# Patient Record
Sex: Male | Born: 1950 | Race: White | Hispanic: No | Marital: Married | Smoking: Current every day smoker
Health system: Southern US, Community
[De-identification: ages and names within clinical notes are randomized; demographics above are authoritative.]

## PROBLEM LIST (undated history)

## (undated) DIAGNOSIS — E785 Hyperlipidemia, unspecified: Secondary | ICD-10-CM

## (undated) DIAGNOSIS — A048 Other specified bacterial intestinal infections: Secondary | ICD-10-CM

## (undated) DIAGNOSIS — R079 Chest pain, unspecified: Secondary | ICD-10-CM

## (undated) DIAGNOSIS — I209 Angina pectoris, unspecified: Secondary | ICD-10-CM

## (undated) DIAGNOSIS — G4730 Sleep apnea, unspecified: Secondary | ICD-10-CM

## (undated) DIAGNOSIS — I444 Left anterior fascicular block: Secondary | ICD-10-CM

## (undated) DIAGNOSIS — J449 Chronic obstructive pulmonary disease, unspecified: Secondary | ICD-10-CM

## (undated) DIAGNOSIS — K219 Gastro-esophageal reflux disease without esophagitis: Secondary | ICD-10-CM

## (undated) DIAGNOSIS — M1990 Unspecified osteoarthritis, unspecified site: Secondary | ICD-10-CM

## (undated) DIAGNOSIS — F419 Anxiety disorder, unspecified: Secondary | ICD-10-CM

## (undated) DIAGNOSIS — E663 Overweight: Secondary | ICD-10-CM

## (undated) DIAGNOSIS — J45909 Unspecified asthma, uncomplicated: Secondary | ICD-10-CM

## (undated) DIAGNOSIS — Z720 Tobacco use: Secondary | ICD-10-CM

## (undated) DIAGNOSIS — I10 Essential (primary) hypertension: Secondary | ICD-10-CM

## (undated) HISTORY — PX: ORIF ANKLE FRACTURE: SUR919

## (undated) HISTORY — DX: Hyperlipidemia, unspecified: E78.5

## (undated) HISTORY — DX: Chest pain, unspecified: R07.9

## (undated) HISTORY — DX: Overweight: E66.3

## (undated) HISTORY — DX: Left anterior fascicular block: I44.4

## (undated) HISTORY — DX: Gastro-esophageal reflux disease without esophagitis: K21.9

## (undated) HISTORY — DX: Tobacco use: Z72.0

## (undated) HISTORY — PX: TONSILLECTOMY: SUR1361

## (undated) HISTORY — DX: Other specified bacterial intestinal infections: A04.8

## (undated) HISTORY — PX: ORIF TIBIA FRACTURE: SHX5416

## (undated) HISTORY — PX: CERVICAL SPINE SURGERY: SHX589

---

## 2001-05-08 ENCOUNTER — Encounter: Payer: Self-pay | Admitting: Internal Medicine

## 2001-05-08 ENCOUNTER — Emergency Department (HOSPITAL_COMMUNITY): Admission: EM | Admit: 2001-05-08 | Discharge: 2001-05-08 | Payer: Self-pay | Admitting: Internal Medicine

## 2001-06-05 ENCOUNTER — Emergency Department (HOSPITAL_COMMUNITY): Admission: EM | Admit: 2001-06-05 | Discharge: 2001-06-05 | Payer: Self-pay | Admitting: *Deleted

## 2002-02-16 ENCOUNTER — Encounter: Payer: Self-pay | Admitting: Emergency Medicine

## 2002-02-16 ENCOUNTER — Emergency Department (HOSPITAL_COMMUNITY): Admission: EM | Admit: 2002-02-16 | Discharge: 2002-02-16 | Payer: Self-pay | Admitting: Emergency Medicine

## 2002-09-05 ENCOUNTER — Encounter: Payer: Self-pay | Admitting: *Deleted

## 2002-09-05 ENCOUNTER — Emergency Department (HOSPITAL_COMMUNITY): Admission: EM | Admit: 2002-09-05 | Discharge: 2002-09-05 | Payer: Self-pay | Admitting: *Deleted

## 2002-11-17 ENCOUNTER — Emergency Department (HOSPITAL_COMMUNITY): Admission: EM | Admit: 2002-11-17 | Discharge: 2002-11-18 | Payer: Self-pay | Admitting: *Deleted

## 2002-11-18 ENCOUNTER — Encounter: Payer: Self-pay | Admitting: *Deleted

## 2003-10-31 DIAGNOSIS — R079 Chest pain, unspecified: Secondary | ICD-10-CM

## 2003-10-31 HISTORY — DX: Chest pain, unspecified: R07.9

## 2004-08-09 ENCOUNTER — Inpatient Hospital Stay (HOSPITAL_COMMUNITY): Admission: EM | Admit: 2004-08-09 | Discharge: 2004-08-12 | Payer: Self-pay | Admitting: *Deleted

## 2004-09-27 HISTORY — PX: ESOPHAGOGASTRODUODENOSCOPY: SHX1529

## 2004-10-14 ENCOUNTER — Ambulatory Visit: Payer: Self-pay | Admitting: Internal Medicine

## 2004-10-19 ENCOUNTER — Ambulatory Visit: Payer: Self-pay | Admitting: Internal Medicine

## 2004-10-19 ENCOUNTER — Ambulatory Visit (HOSPITAL_COMMUNITY): Admission: RE | Admit: 2004-10-19 | Discharge: 2004-10-19 | Payer: Self-pay | Admitting: Internal Medicine

## 2004-10-19 HISTORY — PX: COLONOSCOPY: SHX174

## 2005-02-18 ENCOUNTER — Emergency Department (HOSPITAL_COMMUNITY): Admission: EM | Admit: 2005-02-18 | Discharge: 2005-02-18 | Payer: Self-pay | Admitting: Emergency Medicine

## 2005-04-27 ENCOUNTER — Ambulatory Visit: Payer: Self-pay | Admitting: Urgent Care

## 2005-05-03 ENCOUNTER — Encounter (HOSPITAL_COMMUNITY): Admission: RE | Admit: 2005-05-03 | Discharge: 2005-06-02 | Payer: Self-pay | Admitting: Internal Medicine

## 2005-06-06 ENCOUNTER — Emergency Department (HOSPITAL_COMMUNITY): Admission: EM | Admit: 2005-06-06 | Discharge: 2005-06-07 | Payer: Self-pay | Admitting: *Deleted

## 2006-02-15 ENCOUNTER — Emergency Department (HOSPITAL_COMMUNITY): Admission: EM | Admit: 2006-02-15 | Discharge: 2006-02-15 | Payer: Self-pay | Admitting: Emergency Medicine

## 2007-10-03 ENCOUNTER — Emergency Department (HOSPITAL_COMMUNITY): Admission: EM | Admit: 2007-10-03 | Discharge: 2007-10-04 | Payer: Self-pay | Admitting: Emergency Medicine

## 2009-05-28 ENCOUNTER — Encounter: Payer: Self-pay | Admitting: Emergency Medicine

## 2009-05-28 ENCOUNTER — Inpatient Hospital Stay (HOSPITAL_COMMUNITY): Admission: EM | Admit: 2009-05-28 | Discharge: 2009-06-06 | Payer: Self-pay | Admitting: Emergency Medicine

## 2009-10-17 ENCOUNTER — Emergency Department (HOSPITAL_COMMUNITY): Admission: EM | Admit: 2009-10-17 | Discharge: 2009-10-17 | Payer: Self-pay | Admitting: Emergency Medicine

## 2010-10-07 ENCOUNTER — Ambulatory Visit (HOSPITAL_COMMUNITY)
Admission: RE | Admit: 2010-10-07 | Discharge: 2010-10-07 | Payer: Self-pay | Source: Home / Self Care | Attending: Family Medicine | Admitting: Family Medicine

## 2011-02-04 LAB — BASIC METABOLIC PANEL
BUN: 16 mg/dL (ref 6–23)
CO2: 29 mEq/L (ref 19–32)
Calcium: 8.4 mg/dL (ref 8.4–10.5)
Chloride: 105 mEq/L (ref 96–112)
Creatinine, Ser: 0.97 mg/dL (ref 0.4–1.5)
GFR calc Af Amer: >60 mL/min (ref >60)
GFR calc non Af Amer: >60 mL/min (ref >60)
Glucose, Bld: 126 mg/dL — ABNORMAL HIGH (ref 70–99)
Potassium: 3.9 mEq/L (ref 3.5–5.1)
Sodium: 139 mEq/L (ref 135–145)

## 2011-02-04 LAB — CBC
HCT: 43.1 % (ref 39.0–52.0)
Hemoglobin: 14.9 g/dL (ref 13.0–17.0)
MCHC: 34.5 g/dL (ref 30.0–36.0)
MCV: 97.3 fL (ref 78.0–100.0)
Platelets: 185 10*3/uL (ref 150–400)
RBC: 4.43 MIL/uL (ref 4.22–5.81)
RDW: 14.2 % (ref 11.5–15.5)
WBC: 13.5 10*3/uL — ABNORMAL HIGH (ref 4.0–10.5)
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
## 2011-03-17 NOTE — H&P (Signed)
NAME:  Trevor Berg, Trevor Berg                 ACCOUNT NO.:  [id number]   MEDICAL RECORD NO.:  [id number]          PATIENT TYPE:  AMB   LOCATION:                                FACILITY:  [facility]   PHYSICIAN:  Dalia Heading, M.D.  DATE OF BIRTH:  12/26/1950   DATE OF ADMISSION:  DATE OF DISCHARGE:  LH                                HISTORY & PHYSICAL   CHIEF COMPLAINT:  Chronic cholecystitis.   HISTORY OF PRESENT ILLNESS:  The patient is a 60 year old white male  referred for evaluation and treatment of biliary colic secondary to chronic  cholecystitis.  He has been having right upper quadrant abdominal pain with  radiation to the right flank, nausea, and bloating for several months.  He  does have fatty food intolerance.  No fever, chills, jaundice have been  noted.   PAST MEDICAL HISTORY:  1.  Coronary artery disease.  2.  Hypertension.  3.  Reflux disease.   PAST SURGICAL HISTORY:  Ankle surgery.   CURRENT MEDICATIONS:  1.  Prevacid.  2.  Nitroglycerin p.r.n.  3.  Atenolol 50 mg p.o. daily.  4.  Lactulose 15 mL p.o. b.i.d.  5.  Nystatin 5 mL p.o. four times a day.  6.  Baby aspirin daily.   ALLERGIES:  No known drug allergies.   REVIEW OF SYSTEMS:  The patient smokes two packs of cigarettes a day.  He  drinks alcohol rarely.   PHYSICAL EXAMINATION:  GENERAL: The patient is a well-developed, well-  nourished white male in no acute distress.  VITAL SIGNS: Afebrile, vital signs stable.  LUNGS:  Clear to auscultation with equal breath sounds bilaterally.  HEART:  Regular rate and rhythm without S3, S4, or murmurs.  ABDOMEN: Soft, nondistended.  He is tender in the right upper quadrant to  palpation.  No hepatosplenomegaly, masses, hernias identified.   Ultrasound of the gallbladder is unremarkable.   Hepatobiliary scan reveals low gallbladder ejection fraction with  reproducible symptoms with a fatty meal.   IMPRESSION:  Chronic cholecystitis.   PLAN:  The patient is scheduled  for laparoscopic cholecystectomy on May 22, 2005.  The risks and benefits of the procedure including bleeding,  infection, hepatobiliary injury, and the possibility of an open procedure  were fully explained to the patient who gave informed consent.       MAJ/MEDQ  D:  05/16/2005  T:  05/16/2005  Job:  [id number]   cc:   Lionel December, M.D.  [address]  [place]  [place] [address]

## 2011-03-17 NOTE — Cardiovascular Report (Signed)
NAMEWHITNEY, Trevor Berg                 ACCOUNT NO.:  [id number]   MEDICAL RECORD NO.:  [id number]          PATIENT TYPE:  INP   LOCATION:  3732                         FACILITY:  [facility]   PHYSICIAN:  Darlin Priestly, MD  DATE OF BIRTH:  1951-02-05   DATE OF PROCEDURE:  08/11/2004  DATE OF DISCHARGE:                              CARDIAC CATHETERIZATION   PROCEDURES:  1.  Left heart catheterization.  2.  Coronary angiography.  3.  Left ventriculogram.   ATTENDING PHYSICIAN:  Darlin Priestly, MD   COMPLICATIONS:  None.   INDICATIONS FOR PROCEDURE:  This is a 60 year old male who is a patient of  Dr. Ilene Qua and Dr. Regino Schultze with a history of hypertension, tobacco  abuse and a history of gastritis who was admitted to the hospital on August 09, 2004 with abdominal pain and chest pain. He continued to have symptoms  throughout his stay at [facility] and was ultimately transferred to  [facility].  He is now referred for cardiac catheterization to rule  out significant coronary artery disease.   DESCRIPTION OF PROCEDURE:  After obtaining informed consent the patient was  brought to the cardiac catheterization laboratory where the right and left  groin were shaved, prepped and draped in the usual sterile fashion.  EKG  monitoring was established.  Using the modified Seldinger technique, a #6  French arterial sheath was inserted in the right femoral artery.  A 6 French  diagnostic catheter was used to perform diagnostic angiography.   The left main is a large vessel with no significant disease.   The LAD is a medium size vessel which courses three quarters of the way down  the anterior wall and has no significant disease.  There are two small  diagonal branches with no significant disease.   The left circumflex is a large vessel coursing through the AV groove.  The  third obtuse marginal branch is free of the AV groove circumflex and noted  to have a mild 30%  narrowing after the takeoff the second OM.   The first OM is a small vessel which bifurcates in the proximal portion with  no significant disease.   The second OM is a large vessel which bifurcates distally and has 50% mid  vessel stenosis.   The third OM is a small vessel with no significant disease.   The right coronary artery is a large vessel which is dominant and gives rise  to both the PDA as well as the posterolateral branch.  The RCA is ectatic  but has no high grade stenosis.   The PDA is a medium size vessel with no significant disease.   The PLA is a large vessel which bifurcates in the mid segment. There is 40%  lesion in the proximal portion fo the lower bifurcation.   Left ventriculogram reveals a preserved ejection fraction of 60%.   Hemodynamics:  The systemic arterial pressure was 112/69.  Left ventricular  pressure was 115/8.  Left ventricular end diastolic pressure was 13.   CONCLUSION:  1.  Non-critical  coronary artery disease.  2.  Normal left ventricular systolic function.      Robe   RHM/MEDQ  D:  08/11/2004  T:  08/11/2004  Job:  [id number]

## 2011-03-17 NOTE — Consult Note (Signed)
NAME:  Trevor Berg, Trevor Berg                 ACCOUNT NO.:  [id number]   MEDICAL RECORD NO.:  [id number]          PATIENT TYPE:  INP   LOCATION:  A227                          FACILITY:  [facility]   PHYSICIAN:  R. Roetta Sessions, M.D. DATE OF BIRTH:  [birth year]   DATE OF CONSULTATION:  08/09/2004  DATE OF DISCHARGE:                                   CONSULTATION   REASON FOR CONSULTATION:  Abdominal pain.   REFERRING PHYSICIAN:  Kirk Ruths, M.D.   HISTORY OF PRESENT ILLNESS:  The patient is a 60 year old Caucasian  gentleman who was admitted with chest pain and abdominal pain.  He has a  history of hypertension and modest ____________.  States he has had  abdominal pain for at least six months with intermittent nausea and  vomiting.  He has had episodes of coffee ground emesis and melena most  recently approximately a month ago.  Chest pain began about a week ago.  He  states he simply could not take it anymore.  He also has had some dyspnea on  exertion and diaphoresis with his chest pain.  He notes that the abdominal  pains tend to occur after eating.  He feels full all the time.  He has  severe heartburn symptoms and takes Rolaids regularly with little relief.  He complains of dysphagia of solid foods as well as some odynophagia.  Pain  is located in the upper abdomen.  Nothing seems to be make it any better.  He denies any constipation, diarrhea.  He does have some intermittent small  volume bright red blood per rectum.  He was Hemoccult negative in the  emergency department.  CBC, LFT's, amylase, and lipase are all normal.  Met-  7 is normal as well.  Acute abdominal film revealed no acute disease.  ETOH  level was 19.  Total CK and CK-MB were negative x3.   MEDICATIONS PRIOR TO ADMISSION:  1.  Atenolol 50 mg daily.  2.  Folate p.r.n.   ALLERGIES:  No known drug allergies.   PAST MEDICAL HISTORY:  1.  Hypertension.  2.  Gastroesophageal reflux disease.   PAST SURGICAL  HISTORY:  1.  Tonsillectomy.  2.  Status post right ankle fracture surgery.   FAMILY HISTORY:  Mother died of MI in her 80s.  He has a brother with ulcers  and diabetes mellitus, another brother with borderline diabetes mellitus.   SOCIAL HISTORY:  He is married, has three children.  He is employed as a  Electrical engineer at [employer].  He states he was a prior  heavy alcohol abuser, but had cut back once he got this last job seven years  ago.  He states he is currently drinking approximately one beer every other  day and no liquor.  He denies any drugs.  He smokes 2-1/2 packs of  cigarettes daily and has done so for over 30 years.   REVIEW OF SYSTEMS:  Please see HPI for GI.  CARDIOPULMONARY:  In addition he  complains of chronic cough.  GENERAL:  Denies any weight  loss.   PHYSICAL EXAMINATION:  VITAL SIGNS:  Temperature max 99.2, temperature  currently 97, pulse 69, respirations 20, blood pressure 139/98, weight 192,  height 67 inches.  GENERAL:  A pleasant well-developed, well-nourished Caucasian male in no  acute distress.  SKIN:  Warm and dry, no jaundice.  HEENT:  Pupils equal, round, reactive to light.  Conjunctivae are pink.  Sclerae are nonicteric.  Oropharyngeal mucosa moist and pink, no lesions, no  erythema, no exudates.  NECK:  No lymphadenopathy, no thyromegaly.  CHEST:  Lungs clear to auscultation.  CARDIAC:  Regular rate and rhythm.  Normal S1 and S2.  No murmurs, rubs, or  gallops.  ABDOMEN:  Positive bowel sounds, protuberant, symmetrical.  Soft.  Moderate  tenderness throughout the entire upper abdomen to deep palpation.  Occasional guarding.  No rebound tenderness.  It is difficult to assess for  hepatosplenomegaly or masses due to guarding, but feels like there is some  hepatomegaly based on the first percussion.  EXTREMITIES:  No edema.   LABORATORY DATA:  White blood cells 8.5, hemoglobin 15.1, hematocrit 43.4,  platelets 325,000.   Sodium 141, potassium 4.4, BUN 7, creatinine 1, glucose  99.  Total bilirubin 0.5, alkaline phosphatase 75, SGOT 25, SGPT 29, albumin  3.8, amylase 85, lipase 46.   IMPRESSION:  The patient is a 60 year old gentleman who presents with a one  week history of chest pain, six month history of upper abdominal pain with  intermittent nausea and vomiting, remote coffee ground emesis, chronic  gastroesophageal reflux disease, and dysphagia.  This is in the setting of  reported modest alcohol abuse with chronic tobacco abuse.  He also complains  of small volumes of intermittent hematochezia and/or early satiety.  I would  be concerned about the possibility of peptic ulcer disease, biliary etiology  not excluded.  Rectal bleeding may be due to a benign anorectal source such  as hemorrhoids, however, ultimately he will have colonoscopy for further  evaluation.  It is unclear at this time whether his chest pain is cardiac in  etiology, although he does describe some dyspnea on exertion and diaphoresis  related to this chest pain.  Initially, would like to await initial  cardiology evaluation prior to endoscopy.  Would also follow up with  abdominal ultrasound which is pending.  Ultimately, he will need to have an  upper endoscopy as an inpatient and consider a colonoscopy, possibly done as  an outpatient.  I would like to thank Dr. Regino Schultze for allowing [place] to take  part in the care of this patient.      ________________________________________  Tana Coast, P.A.  ___________________________________________  R. Roetta Sessions, M.D.    LL/MEDQ  D:  08/09/2004  T:  08/09/2004  Job:  [id number]   cc:   Kirk Ruths, M.D.  [address]  [place]  [place] [address]  Fax: [phone]

## 2011-03-17 NOTE — Op Note (Signed)
NAME:  Trevor Berg, Trevor Berg                 ACCOUNT NO.:  [id number]   MEDICAL RECORD NO.:  [id number]          PATIENT TYPE:  AMB   LOCATION:  DAY                           FACILITY:  [facility]   PHYSICIAN:  R. Roetta Sessions, M.D. DATE OF BIRTH:  05-01-1951   DATE OF PROCEDURE:  10/19/2004  DATE OF DISCHARGE:                                 OPERATIVE REPORT   PROCEDURE:  Diagnostic colonoscopy.   INDICATIONS FOR PROCEDURE:  The patient is a 60 year old gentleman with  constipation and intermittent hematochezia. He was just recently started on  MiraLax 17 g orally daily. He is also having some recurrent reflux symptoms  in spite of Prevacid. He was switched to Aciphex 20 mg orally twice daily  which has been very effective in combating his reflux symptoms. Prior EGD  demonstrated Schatzki's ring and erosive esophagitis. He underwent dilation.  Colonoscopy is now being done for the above mentioned reasons. This approach  has been discussed with the patient previously. Potential risks, benefits,  and alternatives have been reviewed and questions answered. He is agreeable.  Please see documentation in the medical record.   PROCEDURE NOTE:  O2 saturation, blood pressure, pulse, and respirations  monitored throughout the entirety of the procedure. Conscious sedation with  Versed 4 mg IV and Demerol 50 mg IV in divided doses. SB prophylaxis  ampicillin 2 g IV, gentamicin 120 mg IV prior to the procedure.   INSTRUMENT:  Olympus video chip system.   FINDINGS:  Digital rectal examination revealed no abnormalities.   ENDOSCOPIC FINDINGS:  Prep was good.   Rectum:  Examination of the rectal mucosa including retroflexed view of anal  verge revealed a somewhat friable anal canal. Otherwise rectal mucosa  appeared normal.   Colon:  Colonic mucosa was surveyed from the rectosigmoid junction through  the left, transverse, and right colon to area of the appendiceal orifice,  ileocecal valve, and cecum.  These structures were well seen and photographed  for the record. Olympus video scope was slowly withdrawn, and all previously  mentioned mucosal surfaces were again seen. The colonic mucosa appeared  normal. The patient tolerated the procedure well and was reactive to  endoscopy.   IMPRESSION:  Friable anal canal, likely source of patient's hematochezia.  Otherwise normal rectum, normal colon.   RECOMMENDATIONS:  1.  Continue MiraLax 17 orally daily.  2.  The patient is to bolster his daily fiber intake.  3.  A 10-day course of Anusol A.C. suppositories 1 per rectum at bedtime.  4.  Office follow up with [place] in 6 months.     Otelia Sergeant   RMR/MEDQ  D:  10/19/2004  T:  10/19/2004  Job:  [id number]   cc:   Kirk Ruths, M.D.  [address]  [place]  [place] [address]  Fax: [phone]

## 2011-03-17 NOTE — H&P (Signed)
NAME:  Trevor Berg, Trevor Berg                 ACCOUNT NO.:  [id number]   MEDICAL RECORD NO.:  [id number]          PATIENT TYPE:  AMB   LOCATION:  DAY                           FACILITY:  [facility]   PHYSICIAN:  Lionel December, M.D.    DATE OF BIRTH:  [birth year]   DATE OF ADMISSION:  DATE OF DISCHARGE:  LH                                HISTORY & PHYSICAL   CHIEF COMPLAINT:  Abdominal pain, constipation, and blood in stool.   HISTORY OF PRESENT ILLNESS:  The patient is a 60 year old Caucasian  gentleman who presents today for further evaluation of the above-stated  symptoms.  He was seen during hospitalization in October of this year.  He  had multiple issues, including chest pain, abdominal pain, nausea and  vomiting, dysphagia, melena, hematemesis.  He underwent EGD at that time,  which revealed a couple of tiny distal esophageal erosions and a noncritical-  appearing Schatzki's ring which was dilated, small hiatal hernia.  He also  had abdominal ultrasound, which revealed some fatty liver, but no etiology  of his symptoms.  CT of the chest, abdomen and pelvis, aside from a 1.4 cm  left hilar lymph node and mildly prominent mediastinal lymph nodes which  were felt to be reactive, was unremarkable.  He ultimately was transferred  to [facility] and underwent cardiac catheterization, which revealed  noncritical coronary artery disease and normal LV systolic function.   He states he continues to have upper abdominal discomfort.  He describes it  as a tightness.  He feels full all the time.  He continues to occasionally  vomit.  He has a bowel movement every three to four days.  He continues to  see intermittent rectal bleeding, especially after passing a hard stool.  He  had a black stool, but that was in the remote past.  He continues to have  poorly-controlled reflux.  He has taken Prevacid sometimes three times a day  with no improvement.  His swallowing has improved but still has some  difficulties.  He has never had a colonoscopy.   CURRENT MEDICATIONS:  1.  Atenolol 50 mg daily.  2.  Carafate 1 g q.i.d.  3.  Nitro-Tab 0.4 mg p.r.n.  4.  Altace 2.5 mg daily.  5.  Prevacid daily.  6.  Nicorette patch.   ALLERGIES:  No known drug allergies.   PAST MEDICAL HISTORY:  1.  Hypertension.  2.  Gastroesophageal reflux disease.  3.  Tobacco abuse.   PAST SURGICAL HISTORY:  1.  Tonsillectomy.  2.  Status post right ankle fracture surgery.  3.  Recent cardiac catheterization as outlined above.   FAMILY HISTORY:  Mother died of MI in her 39s.  He has a brother with ulcers  and diabetes mellitus and another brother with borderline diabetes mellitus.   SOCIAL HISTORY:  He is married and has three children.  He is employed as a  Electrical engineer at [employer].  He states he was a prior  heavy alcohol abuser but cut back seven years ago.  He currently drinks one  beer every other day but no liquor.  Denies any drugs.  He smoked 2-1/2  packs of cigarettes daily for over 30 years and is trying to cut back.   REVIEW OF SYSTEMS:  GASTROINTESTINAL:  See HPI.  CARDIOPULMONARY:  Complains  of chronic cough.  GENERAL:  Denies any weight loss.   PHYSICAL EXAMINATION:  VITAL SIGNS:  Weight 193, stable.  Temperature 97.8,  blood pressure 126/74, pulse 84.  GENERAL:  A pleasant, well-nourished, well-developed Caucasian male who is  quite disheveled-appearing.  In no acute distress.  SKIN:  Warm and dry, no jaundice.  HEENT:  Pupils equal, round, and reactive to light.  Conjunctivae are pink.  Sclerae are nonicteric.  Oral mucosa moist and pink.  No lesions, erythema,  or exudate.  NECK:  No lymphadenopathy or thyromegaly.  CHEST:  Lungs are clear to auscultation.  CARDIAC:  Regular rate and rhythm, normal S1, S2.  No murmurs, rubs, or  gallops.  ABDOMEN:  Positive bowel sounds, soft, nondistended.  He has mild to  moderate tenderness in the entire upper  abdomen to deep palpation.  No  rebound tenderness, no guarding, no hepatosplenomegaly or masses.  EXTREMITIES:  No edema.   IMPRESSION:  Trevor Berg continues to have multiple gastrointestinal symptoms.  He has quite a bit of constipation with intermittent hematochezia.  He  complains of his abdomen feeling tight and full.  He has never had a  colonoscopy, and I have recommended one today, primarily for the rectal  bleeding.  In addition, he does continue to have poorly-controlled reflux  symptoms.  His dysphagia has improved but not completely resolved.  Given  recent esophagogastroduodenoscopy, I feel that a second upper endoscopy  probably will not benefit him at this time.  Will initially try changing his  proton pump inhibitor therapy to see if this helps.  Ultimately, if he  continues to have significant upper gastrointestinal symptoms, he may need  to have esophageal manometry and a pH study.   PLAN:  1.  Colonoscopy in the near future.  Will provide SBE prophylaxis, given MR      and TR on echocardiogram.  2.  Miralax 17 g p.o. daily, #5, __________ and three refills.  3.  Stop Prevacid, try AcipHex 20 mg p.o. b.i.d., #30 samples given.  4.  Further recommendations to follow based on colonoscopy findings.     Lesl   LL/MEDQ  D:  10/14/2004  T:  10/14/2004  Job:  [id number]   cc:   Kirk Ruths, M.D.  [address]  [place]  [place] [address]  Fax: [phone]

## 2011-03-17 NOTE — H&P (Signed)
NAMEFILOMENO, CROMLEY                 ACCOUNT NO.:  [id number]   MEDICAL RECORD NO.:  [id number]          PATIENT TYPE:  INP   LOCATION:  A227                          FACILITY:  [facility]   PHYSICIAN:  Kirk Ruths, M.D.DATE OF BIRTH:  [birth year]   DATE OF ADMISSION:  08/09/2004  DATE OF DISCHARGE:  LH                                HISTORY & PHYSICAL   CHIEF COMPLAINT:  Abdominal pain and chest pain.   HISTORY OF PRESENT ILLNESS:  This is a 60 year old male who states he has  been having some upper abdominal, lower chest pain for several months.  Pain  had gotten especially bad the night of admission.  He presented to the  emergency room, where he had some vomiting, noted some coffee-grounds emesis  as well as he stated he had coffee-ground in his stool of late.  The  patient's workup in the ER, he responded somewhat to nitroglycerin and was  found to have a white count of 8500, hemoglobin 15.  Amylase, lipase, LFTs  were normal.  The patient's acute abdominal series was negative.  The  patient states he had a bowel movement the day before admission.  The  patient does use alcohol regularly.  He is admitted for abdominal pain,  chest pain, rule out MI.   PAST MEDICAL HISTORY:  He has hypertension, for which he takes atenolol 50  mg daily.   He has no known allergies.   He has had surgery of his ankle in the remote past.   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   PHYSICAL EXAMINATION:  GENERAL:  A middle-aged male in no severe distress.  VITAL SIGNS:  He is afebrile, blood pressure is 120/70, respirations are 18  and unlabored, pulse is 84 and regular.  HEENT:  TMs normal.  Pupils equal and reactive to accommodation.  Oropharynx  benign.  NECK:  Supple without JVD, bruit, or thyromegaly.  CHEST:  Lungs are clear in all areas with diminished breath sounds  throughout.  CARDIAC:  Regular sinus rhythm without murmur, gallop, or  rub.  ABDOMEN:  With tenderness especially in the upper abdominal area, especially  in the right upper quadrant area, with positive bowel sounds.  EXTREMITIES:  Without clubbing, cyanosis, or edema.  NEUROLOGIC:  Grossly intact.   ASSESSMENT:  1.  Chest pain, rule out myocardial infarction.  2.  Abdominal pain, rule out gallbladder and peptic ulcer disease.     Will   WMM/MEDQ  D:  08/09/2004  T:  08/09/2004  Job:  [id number]

## 2011-03-17 NOTE — Discharge Summary (Signed)
Trevor Berg, Trevor Berg                 ACCOUNT NO.:  [id number]   MEDICAL RECORD NO.:  [id number]          PATIENT TYPE:  INP   LOCATION:  3732                         FACILITY:  [facility]   PHYSICIAN:  Kirk Ruths, M.D.DATE OF BIRTH:  1951/10/22   DATE OF ADMISSION:  08/09/2004  DATE OF DISCHARGE:  10/12/2005LH                                 DISCHARGE SUMMARY   FINAL DIAGNOSES:  1.  Chest pain.  2.  Abdominal pain.   HOSPITAL COURSE:  This is a 60 year old male who presented to the emergency  room, having upper abdominal or chest pain off and on for several months.  It was especially worse the night of admission, and he also noted vomitus  with coffee ground type emesis, and has had some dark stools of late.  The  patient was admitted to the floor.  Serial cardiac enzymes were obtained,  which were negative.  The patient was seen by GI.  The patient underwent an  acute abdominal series, which was normal.  An abdominal ultrasound was  normal, except for a fatty liver.  He also underwent a CT of the chest which  showed some reactive lymph nodes in the mediastinum; otherwise, normal.  CT  of the abdomen showed fatty changes in the liver; otherwise normal.  CT of  the pelvis was negative.  The patient underwent EGD per Dr. Jena Gauss, and had  some nonspecific esophageal erosions, but findings were considered minimal  in proportion to the patient's symptoms.  He was seen in consultation by  [facility].  A lipid panel was obtained, which showed a  cholesterol of 193.  He continued to have the chest pain off an on which  responded to nitroglycerin.  He was placed on a nitroglycerin drip, and  ultimately transferred to [facility] for cardiac catheterization.   CONDITION ON DISCHARGE:  The patient was stable at the time of discharge.     Will   WMM/MEDQ  D:  09/06/2004  T:  09/06/2004  Job:  [id number]

## 2011-03-17 NOTE — Discharge Summary (Signed)
NAMEDUC, CROCKET                 ACCOUNT NO.:  [id number]   MEDICAL RECORD NO.:  [id number]          PATIENT TYPE:  INP   LOCATION:  3732                         FACILITY:  [facility]   PHYSICIAN:  Dani Gobble, MD       DATE OF BIRTH:  12-17-1950   DATE OF ADMISSION:  08/11/2004  DATE OF DISCHARGE:  08/12/2004                                 DISCHARGE SUMMARY   ADMISSION DIAGNOSIS:  1.  Atypical chest pain.  2.  Positive gastroesophageal reflux disease, recent endoscopy with positive      finding status post Maloney dilatation with distal erosion.  3.  Tobacco abuse 2 1/2 packs per day.  4.  Hyperlipidemia.  5.  Positive family history.  6.  Obesity with BMI of 30.   DISCHARGE DIAGNOSIS:  1.  Noncritical coronary artery disease.  2.  Hypertension.  3.  Gastroesophageal reflux disease, esophageal erosion, and esophageal      strictures.  4.  Ongoing tobacco abuse 2 1/2 packs per day.  5.  Hyperlipidemia.  6.  Positive family history.  7.  Obesity with BMI of 30.   PROCEDURE:  Cardiac catheterization August 11, 2004, Dr. Jenne Campus.   BRIEF HISTORY:  The patient is a 61 year old white male who was admitted to  [facility] with a 2-3 week history of abdominal pain and bilateral  chest pain, mid substernal, some shortness of breath, some nausea.  He has  undergone EGD and was found to have some esophageal erosion and stricture  and subsequently underwent Maloney dilatation of his esophagus.  Despite  this, the pain persisted and it was their opinion that his pain was out of  proportion to his disease.  Cardiology consult was obtained.  He was placed  on heparin and nitroglycerin, had no significant improvement in his  symptoms, and was transferred to [facility] for cardiac  catheterization.   HOSPITAL COURSE:  Cardiac catheterization was performed on August 11, 2004.  This showed a normal RCA with a 40% distal stenosis.  The circumflex had a  30% stenosis and  the OM2 had a 50% stenosis.  There was no other disease of  significance noted.  His EF was 60%.  At the conclusion of the study, it was  Dr. Mikey Bussing opinion that he had noncritical coronary artery disease.  He  was placed in observation overnight.  His labs the following day showed  hemoglobin 14.8, hematocrit 41.9, white count 9.6, platelets 221,000.  Electrolytes normal.  BUN 6, creatinine 1.2.  Glucose 116.  His TSH was  2.846.  Total cholesterol 193, triglycerides 145, HDL 39, LDL 126.  He had a  few expiratory wheezes.  The following a.m., there were no murmurs or rubs.  Cath site was good and it was Dr. Kandis Cocking opinion that he could be  discharged to home.   DISCHARGE MEDICATIONS:  Aspirin 81 mg daily, Tenormin 50 mg daily, Protonix  40 mg daily.  He was placed on a nicotine patch one daily and he was  instructed to follow the package directions to discontinue  smoking.  Carafate 1 gram a.c. and h.s., Zocor 40 mg daily, Altace 2.5 mg, Tylenol 325  mg 2-3 as needed q.4h. p.r.n., nitroglycerin 1/150 under the tongue every 5  minutes as needed, he is to call if he needs three or more.   DISCHARGE INSTRUCTIONS:  Activities light to moderate, no lifting over 10  pounds.  He can resume his normal activities in 72 hours.  Discharge diet  low fat diabetic.  He was to call if he had any cath site problems.  He was  to follow up with Dr. Regino Schultze in [place].  He was instructed to  discontinue smoking and follow up with Dr. Domingo Sep in 2-3 weeks.   CONDITION ON DISCHARGE:  Stable.       WDJ/MEDQ  D:  09/21/2004  T:  09/21/2004  Job:  [id number]   cc:   Kirk Ruths, M.D.  [address]  [place]  [place] [address]  Fax: [phone]

## 2011-03-17 NOTE — Op Note (Signed)
NAME:  Trevor Berg, Trevor Berg                 ACCOUNT NO.:  [id number]   MEDICAL RECORD NO.:  [id number]           PATIENT TYPE:   LOCATION:                                 FACILITY:   PHYSICIAN:  R. Roetta Sessions, M.D.      DATE OF BIRTH:   DATE OF PROCEDURE:  09/27/2004  DATE OF DISCHARGE:                                 OPERATIVE REPORT   PROCEDURE:  Diagnostic esophagogastroduodenoscopy.   ENDOSCOPIST:  Gerrit Friends. Rourk, M.D.   INDICATIONS FOR PROCEDURE:  The patient is a 60 year old gentleman with  upper abdominal chest pain, nausea, vomiting, and esophageal dysphagia.  EGD  is now being done.  The approach had been discussed with the patient at  length.  The potential risks, benefits, and alternatives have been reviewed;  questions answered.  Please see the documentation in the medical record.   PROCEDURE NOTE:  O2 saturation, blood pressure, pulse and respirations were  monitored throughout the entirety of the  procedure.  Conscious sedation:  Demerol 75 mg, Versed 3 mg IV in divided doses, Cetacaine spray for topical  oropharyngeal anesthesia.   INSTRUMENT:  Olympus video chip system.   FINDINGS:  Examination of the tubular esophagus revealed a tiny distal  esophageal erosion and a noncritical appearing Schatzki ring, otherwise the  esophageal mucosa appeared normal.  The EG junction was easily traversed.   STOMACH:  The gastric cavity was empty.  It insufflated well with air.  A  thorough examination of the gastric mucosa including a retroflex view of the  proximal stomach and esophagogastric junction demonstrated only a hiatal  hernia.  The pylorus was patent and easily traversed.  Examination of the  bulb and second portion revealed no abnormalities.   THERAPEUTIC/DIAGNOSTIC MANEUVERS:  A 56 French Maloney dilator was passed to  full insertion with ease. A look back revealed no apparent complication  related to passage of the dilator.   The patient tolerated the procedure  well and was reacted in endoscopy.   IMPRESSION:  1.  A couple of tiny, distal esophageal erosions, otherwise normal esophagus      aside from a    Schatzki ring status post Maloney dilation as described above.  1.  Small hiatal hernia, otherwise normal stomach, normal D1-D2.   DISCUSSION:  The patient's symptoms are out of proportion to endoscopic  findings.  Today's examination does not explain his abdominal pain.   RECOMMENDATIONS:  1.  Continue PPI.  2.  Add Carafate empirically.  3.  Proceed with further evaluation beginning with a CT of the abdomen and      pelvis.     Otelia Sergeant   RMR/MEDQ  D:  09/27/2004  T:  09/27/2004  Job:  [id number]   cc:   Kirk Ruths, M.D.  [address]  [place] [facility] [place]  [place] [address]  Fax: [phone]   R. Roetta Sessions, M.D.  [address]  [place]  [place] [address]  Fax: [phone]

## 2011-03-17 NOTE — Procedures (Signed)
Trevor Berg, Trevor Berg                 ACCOUNT NO.:  [id number]   MEDICAL RECORD NO.:  [id number]          PATIENT TYPE:  INP   LOCATION:  IC07                          FACILITY:  [facility]   PHYSICIAN:  Dani Gobble, MD       DATE OF BIRTH:  [birth year]   DATE OF PROCEDURE:  DATE OF DISCHARGE:  08/11/2004                                  ECHOCARDIOGRAM   REFERRED BY:  Dr. Kirk Ruths and Dr. Dani Gobble.   INDICATIONS:  Trevor Berg is a 60 year old gentleman with hypertension who was  admitted with chest pain and is referred for echocardiogram to evaluate LV  function.   TECHNICAL QUALITY:  The technical quality of the study is adequate.   FINDINGS:  1.  The aorta is within normal limits at 3.8 cm.  2.  The left atrium is also within normal limits at 3.8 cm.  No obvious      clots or masses were appreciated, and the patient appeared to be in      sinus rhythm during this procedure.  3.  The intraventricular septum and posterior wall were mildly thickened at      1.4 cm and 1.3 cm, respectively.  4.  The aortic valve was thin, trileaflet and pliable with normal leaflet      excursion.  No aortic insufficiency was noted.  Doppler interrogation of      the aortic valve was within normal limits.  5.  The mitral valve also appeared structurally normal.  No mitral valve      prolapse was noted.  Trivial mitral regurgitation was noted.  Doppler      interrogation of the mitral valve was within normal limits.  6.  The pulmonic valve was incompletely visualized, but appeared to be      grossly structurally normal.  7.  The tricuspid valve also appeared grossly and structurally normal with      trivial tricuspid regurgitation noted.  8.  The left ventricle was normal in size with the LVIDD measured at 4.1 cm,      and the LVISD measured at 3.2 cm.  Overall, the left ventricular      systolic function was normal, and no regional wall motion abnormalities      are noted.  There was no evidence  for diastolic dysfunction. The right      atrium was normal in size.  The right ventricle appeared somewhat      generous, but with preserved right ventricular systolic function.   IMPRESSION:  1.  Mild, concentric left ventricular hypertrophy.  2.  Trivial mitral and tricuspid regurgitation.  3.  Normal LV size and systolic function without regional wall motion      abnormalities noted.  4.  The right ventricle appeared somewhat generous but with preserved right      ventricular systolic function.      AB/MEDQ  D:  08/11/2004  T:  08/11/2004  Job:  [id number]   cc:   Kirk Ruths, M.D.  [address]  [place]  [place] [address]  Fax: [phone]

## 2011-06-16 ENCOUNTER — Emergency Department (HOSPITAL_COMMUNITY): Payer: Medicare Other

## 2011-06-16 ENCOUNTER — Emergency Department (HOSPITAL_COMMUNITY)
Admission: EM | Admit: 2011-06-16 | Discharge: 2011-06-17 | Disposition: A | Discharge status: Home / Self Care | Payer: Medicare Other | Attending: Emergency Medicine | Admitting: Emergency Medicine

## 2011-06-16 ENCOUNTER — Encounter: Payer: Self-pay | Admitting: *Deleted

## 2011-06-16 DIAGNOSIS — E7800 Pure hypercholesterolemia, unspecified: Secondary | ICD-10-CM | POA: Insufficient documentation

## 2011-06-16 DIAGNOSIS — F17200 Nicotine dependence, unspecified, uncomplicated: Secondary | ICD-10-CM | POA: Insufficient documentation

## 2011-06-16 DIAGNOSIS — Z79899 Other long term (current) drug therapy: Secondary | ICD-10-CM | POA: Insufficient documentation

## 2011-06-16 DIAGNOSIS — R1011 Right upper quadrant pain: Secondary | ICD-10-CM | POA: Insufficient documentation

## 2011-06-16 DIAGNOSIS — L03317 Cellulitis of buttock: Secondary | ICD-10-CM

## 2011-06-16 DIAGNOSIS — Z7982 Long term (current) use of aspirin: Secondary | ICD-10-CM | POA: Insufficient documentation

## 2011-06-16 DIAGNOSIS — L0231 Cutaneous abscess of buttock: Secondary | ICD-10-CM | POA: Insufficient documentation

## 2011-06-16 DIAGNOSIS — I10 Essential (primary) hypertension: Secondary | ICD-10-CM | POA: Insufficient documentation

## 2011-06-16 HISTORY — DX: Anxiety disorder, unspecified: F41.9

## 2011-06-16 HISTORY — DX: Essential (primary) hypertension: I10

## 2011-06-16 LAB — COMPREHENSIVE METABOLIC PANEL
ALT: 13 U/L (ref 0–53)
AST: 14 U/L (ref 0–37)
Albumin: 3.8 g/dL (ref 3.5–5.2)
Alkaline Phosphatase: 90 U/L (ref 39–117)
BUN: 10 mg/dL (ref 6–23)
CO2: 25 mEq/L (ref 19–32)
Calcium: 9.4 mg/dL (ref 8.4–10.5)
Chloride: 101 mEq/L (ref 96–112)
Creatinine, Ser: 0.95 mg/dL (ref 0.50–1.35)
GFR calc Af Amer: >60 mL/min (ref >60)
GFR calc non Af Amer: >60 mL/min (ref >60)
Glucose, Bld: 110 mg/dL — ABNORMAL HIGH (ref 70–99)
Potassium: 4.2 mEq/L (ref 3.5–5.1)
Sodium: 137 meq/L (ref 135–145)
Total Bilirubin: 0.4 mg/dL (ref 0.3–1.2)
Total Protein: 7.2 g/dL (ref 6.0–8.3)

## 2011-06-16 LAB — CBC
HCT: 45.2 % (ref 39.0–52.0)
Hemoglobin: 15.5 g/dL (ref 13.0–17.0)
MCH: 31.8 pg (ref 26.0–34.0)
MCHC: 34.3 g/dL (ref 30.0–36.0)
MCV: 92.8 fL (ref 78.0–100.0)
Platelets: 207 10*3/uL (ref 150–400)
RBC: 4.87 MIL/uL (ref 4.22–5.81)
RDW: 13.7 % (ref 11.5–15.5)
WBC: 20.2 10*3/uL — ABNORMAL HIGH (ref 4.0–10.5)

## 2011-06-16 LAB — DIFFERENTIAL
Basophils Absolute: 0 10*3/uL (ref 0.0–0.1)
Basophils Relative: 0 % (ref 0–1)
Eosinophils Absolute: 0 10*3/uL (ref 0.0–0.7)
Eosinophils Relative: 0 % (ref 0–5)
Lymphocytes Relative: 15 % (ref 12–46)
Lymphs Abs: 3 10*3/uL (ref 0.7–4.0)
Monocytes Absolute: 2.3 10*3/uL — ABNORMAL HIGH (ref 0.1–1.0)
Monocytes Relative: 11 % (ref 3–12)
Neutro Abs: 14.9 10*3/uL — ABNORMAL HIGH (ref 1.7–7.7)
Neutrophils Relative %: 74 % (ref 43–77)

## 2011-06-16 LAB — GLUCOSE, CAPILLARY: Glucose-Capillary: 110 mg/dL — ABNORMAL HIGH (ref 70–99)

## 2011-06-16 LAB — LIPASE, BLOOD: Lipase: 33 U/L (ref 11–59)

## 2011-06-16 MED ORDER — HYDROMORPHONE HCL 1 MG/ML IJ SOLN
1.0000 mg | Freq: Once | INTRAMUSCULAR | Status: AC
  Administered 2011-06-16: 1 mg via INTRAVENOUS
  Filled 2011-06-16: qty 1

## 2011-06-16 MED ORDER — SODIUM CHLORIDE 0.9 % IV SOLN
Freq: Once | INTRAVENOUS | Status: AC
  Administered 2011-06-16: 21:00:00 via INTRAVENOUS

## 2011-06-16 MED ORDER — ONDANSETRON HCL 4 MG/2ML IJ SOLN
4.0000 mg | Freq: Once | INTRAMUSCULAR | Status: AC
  Administered 2011-06-16: 4 mg via INTRAVENOUS
  Filled 2011-06-16: qty 2

## 2011-06-16 NOTE — ED Provider Notes (Signed)
History     CSN: [id number] Arrival date & time: 06/16/2011  5:54 PM  Chief Complaint  Patient presents with  . Fever  . Abscess   Patient is a 60 y.o. male presenting with fever and abscess. The history is provided by the patient and the spouse. No language interpreter was used.  Fever Primary symptoms of the febrile illness include fever and abdominal pain. Primary symptoms do not include nausea, vomiting or diarrhea. The current episode started 3 to 5 days ago. This is a new problem. The problem has been gradually worsening.  Abscess  Associated symptoms include a fever. Pertinent negatives include no diarrhea and no vomiting.    Past Medical History  Diagnosis Date  . Hypertension   . Hypercholesteremia   . Bronchitis, chronic   . Anxiety     Past Surgical History  Procedure Date  . Cervical spine surgery   . Ankle surgery bilateral  . Appendectomy   . Tonsillectomy     History reviewed. No pertinent family history.  History  Substance Use Topics  . Smoking status: Current Everyday Smoker  . Smokeless tobacco: Not on file  . Alcohol Use: Yes     occasionally      Review of Systems  Constitutional: Positive for fever.  Gastrointestinal: Positive for abdominal pain and constipation. Negative for nausea, vomiting and diarrhea.  Skin:       abscess    Physical Exam  BP 133/76  Pulse 104  Temp(Src) 99.9 F (37.7 C) (Oral)  Resp 22  Ht 5\' 6"  (1.676 m)  Wt 205 lb 1.6 oz (93.033 kg)  BMI 33.10 kg/m2  Physical Exam  Nursing note and vitals reviewed. Constitutional: He is oriented to person, place, and time. Vital signs are normal. He appears well-developed and well-nourished. No distress.  HENT:  Head: Normocephalic and atraumatic.  Right Ear: External ear normal.  Left Ear: External ear normal.  Nose: Nose normal.  Mouth/Throat: No oropharyngeal exudate.  Eyes: Conjunctivae and EOM are normal. Pupils are equal, round, and reactive to light. Right eye  exhibits no discharge. Left eye exhibits no discharge. No scleral icterus.  Neck: Normal range of motion. Neck supple. No JVD present. No tracheal deviation present. No thyromegaly present.  Cardiovascular: Normal rate, regular rhythm, normal heart sounds, intact distal pulses and normal pulses.  Exam reveals no gallop and no friction rub.   No murmur heard. Pulmonary/Chest: Effort normal and breath sounds normal. No stridor. No respiratory distress. He has no wheezes. He has no rales. He exhibits no tenderness.  Abdominal: Soft. Normal appearance and bowel sounds are normal. He exhibits no distension and no mass. There is tenderness. There is no rebound and no guarding.  Musculoskeletal: Normal range of motion. He exhibits no edema and no tenderness.       Legs: Lymphadenopathy:    He has no cervical adenopathy.  Neurological: He is alert and oriented to person, place, and time. He has normal reflexes. No cranial nerve deficit. Coordination normal. GCS eye subscore is 4. GCS verbal subscore is 5. GCS motor subscore is 6.  Reflex Scores:      Tricep reflexes are 2+ on the right side and 2+ on the left side.      Bicep reflexes are 2+ on the right side and 2+ on the left side.      Brachioradialis reflexes are 2+ on the right side and 2+ on the left side.      Patellar reflexes are  2+ on the right side and 2+ on the left side.      Achilles reflexes are 2+ on the right side and 2+ on the left side. Skin: Skin is warm and dry. No rash noted. He is not diaphoretic.  Psychiatric: He has a normal mood and affect. His speech is normal and behavior is normal. Judgment and thought content normal. Cognition and memory are normal.    ED Course  Procedures  MDM       Worthy Rancher, PA 06/16/11 2036  Worthy Rancher, PA 06/16/11 2038  Medical screening examination/treatment/procedure(s) were conducted as a shared visit with non-physician practitioner(s) and myself.  I personally  evaluated the patient during the encounter  Nicoletta Dress. Colon Branch, MD 06/17/11 [phone]

## 2011-06-16 NOTE — ED Notes (Signed)
Pt has multiple complaints; pt c/o cough with fever and pain to right ribs; pt also c/o abscess to left buttock

## 2011-06-16 NOTE — ED Notes (Addendum)
Patient complains of knot on right upper side. States it is very painful- pain of 8/10 at this moment. Knot has been present for 1-2 weeks.  Pt. states that he has an abscess on his left buttock, with redness that was previously draining  approximately 3 days ago and has now stopped. States that this is very painful. Now 5/10.    Pt has productive cough with white sputum. Began 2 days ago. Has coughed more today than any other day. Associated with sneezing and fever. He does not have a thermometer at home but has felt warm to the touch at home, no chills. No BM in 3 days. Last BM patient felt like he was straining to use the rest room. Patient states that he has been drinking adequate fluids and that he has been urinating frequently. Patient states that he has been drinking a lot more frequently than usual and has felt very "dry" in his mouth. No increased hunger.

## 2011-06-17 ENCOUNTER — Emergency Department (HOSPITAL_COMMUNITY): Payer: Medicare Other

## 2011-06-17 MED ORDER — ONDANSETRON 4 MG PO TBDP: 4.0000 mg | ORAL_TABLET | Freq: Three times a day (TID) | ORAL | Status: AC | PRN

## 2011-06-17 MED ORDER — HYDROCODONE-ACETAMINOPHEN 5-325 MG PO TABS: 1.0000 | ORAL_TABLET | ORAL | Status: AC | PRN

## 2011-06-17 MED ORDER — DOXYCYCLINE HYCLATE 100 MG PO CAPS: 100.0000 mg | ORAL_CAPSULE | Freq: Two times a day (BID) | ORAL | Status: AC

## 2011-06-17 MED ORDER — HYDROMORPHONE HCL 2 MG/ML IJ SOLN
2.0000 mg | Freq: Once | INTRAMUSCULAR | Status: AC
  Administered 2011-06-17: 2 mg via INTRAVENOUS
  Filled 2011-06-17: qty 1

## 2011-06-17 MED ORDER — IOHEXOL 300 MG/ML  SOLN
100.0000 mL | Freq: Once | INTRAMUSCULAR | Status: AC | PRN
  Administered 2011-06-17: 100 mL via INTRAVENOUS

## 2011-06-17 MED ORDER — ONDANSETRON HCL 4 MG/2ML IJ SOLN
4.0000 mg | Freq: Once | INTRAMUSCULAR | Status: AC
  Administered 2011-06-17: 4 mg via INTRAVENOUS
  Filled 2011-06-17: qty 2

## 2011-06-17 MED ORDER — DOXYCYCLINE HYCLATE 100 MG PO TABS
100.0000 mg | ORAL_TABLET | Freq: Once | ORAL | Status: AC
  Administered 2011-06-17: 100 mg via ORAL
  Filled 2011-06-17: qty 1

## 2011-06-17 NOTE — Progress Notes (Signed)
0128 Assumed care/ disposition of patient. Patient with beginning abscess on buttock, elevated wbc, c/o RUQ abdominal pain imtermittently. Awaiting CT abd/pelvis. Plan to d/c home with antibiotics and analgesics. 9811 Reviewed att results and CT findings  with patient and his wife. They are in agreement with plan.

## 2011-06-17 NOTE — Discharge Instructions (Signed)
 Apply hot compresses to the buttock three times a day. If the area is not improving on antibiotics, return to the ER for incision and drainage or see your doctor. Take all of the antibiotics. Use the pain and nausea  Abdominal Pain Abdominal pain can be caused by many things. Your caregiver decides the seriousness of your pain by an examination and possibly blood tests and X-rays. Many cases can be observed and treated at home. Most abdominal pain is not caused by a disease and will probably improve without treatment. However, in many cases, more time must pass before a clear cause of the pain can be found. Before that point, it may not be known if you need more testing, or if hospitalization or surgery is needed. HOME CARE INSTRUCTIONS  Do not take laxatives unless directed by your caregiver.   Take pain medicine only as directed by your caregiver.   Only take over-the-counter or prescription medicines for pain, discomfort, or fever as directed by your caregiver.   Try a clear liquid diet (broth, tea, or water) for 8or as ordered by your caregiver. Slowly move to a bland diet as tolerated.  SEEK IMMEDIATE MEDICAL CARE IF:  The pain does not go away.   You or your child has an oral temperature above101not controlled by medicine.   You keep throwing up (vomiting).   The pain is felt only in portions of the abdomen. Pain in the right side could possibly be appendicitis. In an adult, pain in the left lower portion of the abdomen could be colitis or diverticulitis.   You pass bloody or black tarry stools.  MAKE SURE YOU:  Understand these instructions.   Will watch your condition.   Will get help right away if you are not doing well or get worse.  Document Released: 07/26/2005 Document Re-Released: 01/10/2010 [facility] Patient Information 2011 [place], [place]. medicine as needed.

## 2011-08-07 LAB — PROTIME-INR
INR: 0.9
Prothrombin Time: 12.4

## 2011-08-07 LAB — POCT CARDIAC MARKERS
CKMB, poc: 1.6
CKMB, poc: <1 — ABNORMAL LOW
CKMB, poc: <1 — ABNORMAL LOW
Myoglobin, poc: 41.2
Myoglobin, poc: 48.9
Myoglobin, poc: 67
Operator id: 157891
Operator id: 216221
Operator id: 216221
Troponin i, poc: <0.05
Troponin i, poc: <0.05
Troponin i, poc: <0.05

## 2011-08-07 LAB — DIFFERENTIAL
Basophils Absolute: 0
Basophils Relative: 0
Eosinophils Absolute: 0.4
Eosinophils Relative: 4
Lymphocytes Relative: 31
Lymphs Abs: 3.5
Monocytes Absolute: 0.9
Monocytes Relative: 8
Neutro Abs: 6.4
Neutrophils Relative %: 57

## 2011-08-07 LAB — CBC
HCT: 44.2
Hemoglobin: 15.1
MCHC: 34.2
MCV: 93.5
Platelets: 222
RBC: 4.73
RDW: 14
WBC: 11.3 — ABNORMAL HIGH

## 2011-08-07 LAB — D-DIMER, QUANTITATIVE: D-Dimer, Quant: 0.22

## 2011-08-07 LAB — COMPREHENSIVE METABOLIC PANEL
ALT: 31
AST: 25
Albumin: 3.8
Alkaline Phosphatase: 78
BUN: 9
CO2: 29
Calcium: 9
Chloride: 106
Creatinine, Ser: 0.98
GFR calc Af Amer: >60
GFR calc non Af Amer: >60
Glucose, Bld: 110 — ABNORMAL HIGH
Potassium: 3.6
Sodium: 140
Total Bilirubin: 0.4
Total Protein: 6.6

## 2011-08-07 LAB — APTT: aPTT: 27

## 2011-08-29 ENCOUNTER — Emergency Department (HOSPITAL_COMMUNITY): Payer: Medicare Other

## 2011-08-29 ENCOUNTER — Encounter (HOSPITAL_COMMUNITY): Payer: Self-pay

## 2011-08-29 ENCOUNTER — Inpatient Hospital Stay (HOSPITAL_COMMUNITY)
Admission: EM | Admit: 2011-08-29 | Discharge: 2011-09-04 | DRG: 192 | Disposition: A | Discharge status: Home / Self Care | Payer: Medicare Other | Attending: Family Medicine | Admitting: Family Medicine

## 2011-08-29 DIAGNOSIS — J209 Acute bronchitis, unspecified: Principal | ICD-10-CM | POA: Diagnosis present

## 2011-08-29 DIAGNOSIS — I2510 Atherosclerotic heart disease of native coronary artery without angina pectoris: Secondary | ICD-10-CM | POA: Diagnosis present

## 2011-08-29 DIAGNOSIS — I10 Essential (primary) hypertension: Secondary | ICD-10-CM | POA: Diagnosis present

## 2011-08-29 DIAGNOSIS — K219 Gastro-esophageal reflux disease without esophagitis: Secondary | ICD-10-CM | POA: Diagnosis present

## 2011-08-29 DIAGNOSIS — J441 Chronic obstructive pulmonary disease with (acute) exacerbation: Secondary | ICD-10-CM

## 2011-08-29 DIAGNOSIS — R0902 Hypoxemia: Secondary | ICD-10-CM

## 2011-08-29 DIAGNOSIS — J440 Chronic obstructive pulmonary disease with acute lower respiratory infection: Principal | ICD-10-CM | POA: Diagnosis present

## 2011-08-29 MED ORDER — PREDNISONE 20 MG PO TABS
60.0000 mg | ORAL_TABLET | Freq: Every day | ORAL | Status: DC
  Filled 2011-08-29: qty 3

## 2011-08-29 MED ORDER — ALBUTEROL SULFATE (5 MG/ML) 0.5% IN NEBU
5.0000 mg | INHALATION_SOLUTION | Freq: Once | RESPIRATORY_TRACT | Status: AC
  Administered 2011-08-29: 5 mg via RESPIRATORY_TRACT
  Filled 2011-08-29: qty 1

## 2011-08-29 MED ORDER — HYDROCODONE-ACETAMINOPHEN 5-325 MG PO TABS
1.0000 | ORAL_TABLET | Freq: Once | ORAL | Status: AC
  Administered 2011-08-29: 1 via ORAL
  Filled 2011-08-29: qty 1

## 2011-08-29 MED ORDER — IPRATROPIUM BROMIDE 0.02 % IN SOLN
0.5000 mg | Freq: Once | RESPIRATORY_TRACT | Status: AC
  Administered 2011-08-29: 0.5 mg via RESPIRATORY_TRACT
  Filled 2011-08-29: qty 2.5

## 2011-08-29 MED ORDER — ALBUTEROL SULFATE (5 MG/ML) 0.5% IN NEBU
5.0000 mg | INHALATION_SOLUTION | Freq: Once | RESPIRATORY_TRACT | Status: AC
  Administered 2011-08-30: 5 mg via RESPIRATORY_TRACT
  Filled 2011-08-29: qty 1

## 2011-08-29 MED ORDER — PREDNISONE 20 MG PO TABS
60.0000 mg | ORAL_TABLET | Freq: Once | ORAL | Status: AC
Start: 2011-08-29 — End: 2011-08-29
  Administered 2011-08-29: 60 mg via ORAL

## 2011-08-29 NOTE — ED Provider Notes (Signed)
History    Scribed for Celene Kras, MD, the patient was seen in room APA18/AP[facility]18. This chart was scribed by Katha Cabal.   CSN: [id number] Arrival date & time: 08/29/2011  8:40 PM   First MD Initiated Contact with Patient 08/29/11 2129      Chief Complaint  Patient presents with  . Cough  . Nasal Congestion    (Consider location/radiation/quality/duration/timing/severity/associated sxs/prior treatment) HPI Trevor Berg is a 60 y.o. male who presents to the Emergency Department complaining of gradual worsening of persistent mild to moderate cough with associated fever (undocumented), nasal congestion and headache that began 3 days ago.  Patient is a smoker and inhaler provided no relief.  Patient with history of chronic bronchitis, HTN, hypercholesterolemia, and acid reflux.    Past Medical History  Diagnosis Date  . Hypertension   . Hypercholesteremia   . Bronchitis, chronic   . Anxiety   . Acid reflux   . Acute angina     Past Surgical History  Procedure Date  . Cervical spine surgery   . Ankle surgery bilateral  . Appendectomy   . Tonsillectomy     No family history on file.  History  Substance Use Topics  . Smoking status: Current Everyday Smoker  . Smokeless tobacco: Not on file  . Alcohol Use: Yes     occasionally      Review of Systems  All other systems reviewed and are negative.    Allergies  Tomato  Home Medications   Current Outpatient Rx  Name Route Sig Dispense Refill  . ACETAMINOPHEN ER 650 MG PO TBCR Oral Take 650 mg by mouth daily as needed. For headache pain     . ALBUTEROL SULFATE HFA 108 (90 BASE) MCG/ACT IN AERS Inhalation Inhale 2 puffs into the lungs every 4 (four) hours as needed. For shortness of breath     . ASPIRIN EC 81 MG PO TBEC Oral Take 81 mg by mouth daily.      . ATENOLOL 50 MG PO TABS Oral Take 50 mg by mouth daily.      . ATORVASTATIN CALCIUM 20 MG PO TABS Oral Take 20 mg by mouth daily.      . DEXLANSOPRAZOLE 60  MG PO CPDR Oral Take 60 mg by mouth daily.      . CORICIDIN HBP CONGESTION/COUGH PO Oral Take 2 tablets by mouth every 4 (four) hours as needed. For bronchitis     . LORAZEPAM 1 MG PO TABS Oral Take 1 mg by mouth every 4 (four) hours as needed. For nerves    . OVER THE COUNTER MEDICATION Oral Take 1 tablet by mouth as needed. Guaifenesin 200mg /Phenylephrine 5mg : Sinus Relief       BP 142/81  Pulse 83  Temp(Src) 97.5 F (36.4 C) (Oral)  Resp 16  Ht 5\' 7"  (1.702 m)  Wt 195 lb 11.2 oz (88.769 kg)  BMI 30.65 kg/m2  SpO2 94%  Physical Exam  Nursing note and vitals reviewed. Constitutional: He appears well-developed and well-nourished. No distress.  HENT:  Head: Normocephalic and atraumatic.  Right Ear: Tympanic membrane and external ear normal.  Left Ear: Tympanic membrane and external ear normal.  Nose: Right sinus exhibits maxillary sinus tenderness (mild). Left sinus exhibits maxillary sinus tenderness (mild).  Eyes: Conjunctivae are normal. Right eye exhibits no discharge. Left eye exhibits no discharge. No scleral icterus.  Neck: Neck supple. No tracheal deviation present.  Cardiovascular: Normal rate, regular rhythm and intact distal pulses.  Pulmonary/Chest: Effort normal. No accessory muscle usage or stridor. No respiratory distress. He has wheezes. He has no rales.       Wheezing on expiration  Abdominal: Soft. Bowel sounds are normal. He exhibits no distension. There is no tenderness. There is no rebound and no guarding.  Musculoskeletal: He exhibits no edema and no tenderness.  Neurological: He is alert. He has normal strength. No sensory deficit. Cranial nerve deficit:  no gross defecits noted. He exhibits normal muscle tone. He displays no seizure activity. Coordination normal.  Skin: Skin is warm and dry. No rash noted. No cyanosis.  Psychiatric: He has a normal mood and affect.    ED Course  Procedures (including critical care time)   DIAGNOSTIC STUDIES: Oxygen  Saturation is 94% on room air, adequate by my interpretation.    COORDINATION OF CARE:  9:41 PM  Physical exam complete.  Discussed CXR findings with patient.  No evidence of PNA.  Will order patient breathing treatment and prednisone.  Pain control.    Repeat oxygen saturation high 80s after treatments.  Pt still having dyspnea and congestion.  Will plan on admission for copd exacerbation.  3 treatments and steroids po given.     LABS / RADIOLOGY:  Labs Reviewed - No data to display Dg Chest 2 View  08/29/2011  *RADIOLOGY REPORT*  Clinical Data: Cough, congestion, smoker, hypertension  CHEST - 2 VIEW  Comparison: 10/07/2010  Findings: Normal heart size, mediastinal contours, and pulmonary vascularity. Bronchitic changes. No pulmonary infiltrate, pleural effusion or pneumothorax. No acute osseous findings. Prior cervical spine fusion.  IMPRESSION: Mild chronic bronchitic changes.  Original Report Authenticated By: Lollie Marrow, M.D.         MDM   MDM: Patient with COPD exacerbation. No evidence of pneumonia on chest x-ray. Patient continued to have hypoxia despite 3 albuterol treatments and steroids. We'll plan on admission     MEDICATIONS GIVEN IN THE E.D. Scheduled Meds:    . albuterol  5 mg Nebulization Once  . HYDROcodone-acetaminophen  1 tablet Oral Once  . ipratropium  0.5 mg Nebulization Once  . predniSONE  60 mg Oral QAC breakfast   Continuous Infusions:     IMPRESSION: COPD exacerbation    I personally performed the services described in this documentation, which was scribed in my presence.  The recorded information has been reviewed and considered.            Celene Kras, MD 08/29/11 [phone]

## 2011-08-29 NOTE — ED Notes (Signed)
Pt reports cough/congestion, and "low fever" since Saturday.  Pt has some expiratory wheezing in triage.  Pt reports severe headache.

## 2011-08-30 ENCOUNTER — Encounter (HOSPITAL_COMMUNITY): Payer: Self-pay | Admitting: *Deleted

## 2011-08-30 LAB — BASIC METABOLIC PANEL
BUN: 16 mg/dL (ref 6–23)
CO2: 23 meq/L (ref 19–32)
Calcium: 9.3 mg/dL (ref 8.4–10.5)
Chloride: 99 mEq/L (ref 96–112)
Creatinine, Ser: 1.03 mg/dL (ref 0.50–1.35)
GFR calc Af Amer: 90 mL/min — ABNORMAL LOW (ref >90)
GFR calc non Af Amer: 78 mL/min — ABNORMAL LOW (ref >90)
Glucose, Bld: 93 mg/dL (ref 70–99)
Potassium: 3.2 mEq/L — ABNORMAL LOW (ref 3.5–5.1)
Sodium: 137 mEq/L (ref 135–145)

## 2011-08-30 LAB — CBC
HCT: 44.8 % (ref 39.0–52.0)
Hemoglobin: 15.2 g/dL (ref 13.0–17.0)
MCH: 31.8 pg (ref 26.0–34.0)
MCHC: 33.9 g/dL (ref 30.0–36.0)
MCV: 93.7 fL (ref 78.0–100.0)
Platelets: 191 10*3/uL (ref 150–400)
RBC: 4.78 MIL/uL (ref 4.22–5.81)
RDW: 13.5 % (ref 11.5–15.5)
WBC: 10.3 10*3/uL (ref 4.0–10.5)

## 2011-08-30 MED ORDER — PANTOPRAZOLE SODIUM 40 MG PO TBEC
40.0000 mg | DELAYED_RELEASE_TABLET | Freq: Every day | ORAL | Status: DC
  Administered 2011-08-30 – 2011-09-03 (×5): 40 mg via ORAL
  Filled 2011-08-30 (×5): qty 1

## 2011-08-30 MED ORDER — ALBUTEROL SULFATE (5 MG/ML) 0.5% IN NEBU
2.5000 mg | INHALATION_SOLUTION | RESPIRATORY_TRACT | Status: DC
  Administered 2011-08-30 (×6): 2.5 mg via RESPIRATORY_TRACT
  Filled 2011-08-30 (×6): qty 0.5

## 2011-08-30 MED ORDER — MOXIFLOXACIN HCL IN NACL 400 MG/250ML IV SOLN
400.0000 mg | INTRAVENOUS | Status: DC
  Administered 2011-08-31 – 2011-09-04 (×5): 400 mg via INTRAVENOUS
  Filled 2011-08-30 (×5): qty 250

## 2011-08-30 MED ORDER — SODIUM CHLORIDE 0.9 % IJ SOLN
3.0000 mL | Freq: Two times a day (BID) | INTRAMUSCULAR | Status: DC
  Administered 2011-08-30: 6 mL via INTRAVENOUS
  Administered 2011-08-30 – 2011-09-03 (×10): 3 mL via INTRAVENOUS
  Filled 2011-08-30 (×11): qty 3
  Filled 2011-08-30: qty 6

## 2011-08-30 MED ORDER — MOXIFLOXACIN HCL IN NACL 400 MG/250ML IV SOLN
400.0000 mg | Freq: Once | INTRAVENOUS | Status: AC
  Administered 2011-08-30: 400 mg via INTRAVENOUS
  Filled 2011-08-30: qty 250

## 2011-08-30 MED ORDER — IPRATROPIUM BROMIDE 0.02 % IN SOLN
0.5000 mg | RESPIRATORY_TRACT | Status: DC
  Administered 2011-08-30 (×6): 0.5 mg via RESPIRATORY_TRACT
  Filled 2011-08-30 (×6): qty 2.5

## 2011-08-30 MED ORDER — ASPIRIN 81 MG PO CHEW
81.0000 mg | CHEWABLE_TABLET | Freq: Every day | ORAL | Status: DC
  Administered 2011-08-30 – 2011-09-03 (×5): 81 mg via ORAL
  Filled 2011-08-30 (×5): qty 1

## 2011-08-30 MED ORDER — ACETAMINOPHEN 325 MG PO TABS
650.0000 mg | ORAL_TABLET | Freq: Four times a day (QID) | ORAL | Status: DC | PRN
  Administered 2011-08-30 – 2011-09-01 (×4): 650 mg via ORAL
  Filled 2011-08-30 (×4): qty 2

## 2011-08-30 MED ORDER — LORAZEPAM 1 MG PO TABS
1.0000 mg | ORAL_TABLET | Freq: Three times a day (TID) | ORAL | Status: DC | PRN
  Administered 2011-08-30: 1 mg via ORAL
  Filled 2011-08-30 (×2): qty 1

## 2011-08-30 MED ORDER — PNEUMOCOCCAL VAC POLYVALENT 25 MCG/0.5ML IJ INJ
0.5000 mL | INJECTION | INTRAMUSCULAR | Status: AC
  Administered 2011-08-31: 0.5 mL via INTRAMUSCULAR
  Filled 2011-08-30: qty 0.5

## 2011-08-30 MED ORDER — METHYLPREDNISOLONE SODIUM SUCC 125 MG IJ SOLR
125.0000 mg | Freq: Four times a day (QID) | INTRAMUSCULAR | Status: DC
  Administered 2011-08-30 – 2011-09-04 (×20): 125 mg via INTRAVENOUS
  Filled 2011-08-30 (×21): qty 2

## 2011-08-30 MED ORDER — SIMVASTATIN 20 MG PO TABS
40.0000 mg | ORAL_TABLET | Freq: Every day | ORAL | Status: DC
  Administered 2011-08-30 – 2011-09-03 (×5): 40 mg via ORAL
  Filled 2011-08-30: qty 1
  Filled 2011-08-30 (×4): qty 2
  Filled 2011-08-30: qty 1

## 2011-08-30 MED ORDER — ENOXAPARIN SODIUM 40 MG/0.4ML ~~LOC~~ SOLN
40.0000 mg | Freq: Every day | SUBCUTANEOUS | Status: DC
  Administered 2011-08-30 – 2011-09-03 (×5): 40 mg via SUBCUTANEOUS
  Filled 2011-08-30 (×5): qty 0.4

## 2011-08-30 MED ORDER — ATENOLOL 25 MG PO TABS
50.0000 mg | ORAL_TABLET | Freq: Every day | ORAL | Status: DC
  Administered 2011-08-30 – 2011-09-03 (×5): 50 mg via ORAL
  Filled 2011-08-30 (×5): qty 2

## 2011-08-30 MED ORDER — METHYLPREDNISOLONE SODIUM SUCC 125 MG IJ SOLR
125.0000 mg | Freq: Four times a day (QID) | INTRAMUSCULAR | Status: DC
  Administered 2011-08-30 (×2): 125 mg via INTRAVENOUS
  Filled 2011-08-30 (×2): qty 2

## 2011-08-30 NOTE — Progress Notes (Signed)
Trevor Berg, Trevor Berg                 ACCOUNT NO.:  [id number]  MEDICAL RECORD NO.:  [id number]  LOCATION:  A313                          FACILITY:  [facility]  PHYSICIAN:  S[place]ton Square G. Renard Matter, MD   DATE OF BIRTH:  Sep 04, 1951  DATE OF PROCEDURE: DATE OF DISCHARGE:                                PROGRESS NOTE   This patient was admitted with increased respiratory distress, cough, exacerbation of COPD and bronchiectasis.  He has been placed on IV fluids with Solu-Medrol 125 mg every 6 hours, Avelox 400 mg IV every 24 hours, nebulizer treatments, albuterol every 4 hours.  Remains on IV fluids and nasal O2.  OBJECTIVE:  VITAL SIGNS:  Blood pressure 125/75, respirations 20, pulse 74, temp 97.5, O2 sats 91%. LUNGS:  Occasional rhonchus heard over lower lung field. HEART:  Regular rhythm. ABDOMEN:  No palpable organs or masses.  ASSESSMENT:  The patient is admitted with bronchitis and exacerbation of chronic obstructive pulmonary disease.  PLAN:  To continue current above-stated medications, continue current regimen.     Mykelle Cockerell G. Renard Matter, MD     AGM/MEDQ  D:  08/30/2011  T:  08/30/2011  Job:  [id number]

## 2011-08-30 NOTE — H&P (Signed)
NAMEBRADFORD, CAZIER                 ACCOUNT NO.:  [id number]  MEDICAL RECORD NO.:  [id number]  LOCATION:  A313                          FACILITY:  [facility]  PHYSICIAN:  Vianca Bracher G. Hermen Mario, MD   DATE OF BIRTH:  Aug 14, 1951  DATE OF ADMISSION:  08/29/2011 DATE OF DISCHARGE:  LH                             HISTORY & PHYSICAL   This 60 year old male presented to the emergency room complaining of shortness of breath and cough which had been present over a period of several days, does have a history of chronic bronchitis and COPD, hypertension, hyperlipidemia and acid reflux.  X-ray of chest showed evidence of mild chronic bronchitic changes but no pneumonia.  LABORATORY STUDIES:  CBC; WBC 10300 with a hemoglobin of 15.2, hematocrit 44.8.  Chemistries show slightly low serum potassium at 3.2. He was given albuterol treatments and steroids in the emergency department.  Subsequently was admitted.  SOCIAL HISTORY:  The patient continues to smoke daily and uses alcohol on occasions.  PAST MEDICAL HISTORY:  The patient has history of hypertension, hyperlipidemia, bronchitis, anxiety, acid reflux, and coronary artery disease with episodes of angina.  SURGICAL HISTORY:  Appendectomy, tonsillectomy, ankle surgery, and cervical spine surgery.  REVIEW OF SYSTEMS:  HEENT:  Negative.  CARDIOPULMONARY:  The patient has had cough, congestion over a period of several days.  GI:  No nausea, vomiting, or diarrhea.  GU:  No dysuria, hematuria.  ALLERGIES:  To TOMATOES.  MEDICATION LIST: 1. Acetaminophen 650 mg p.r.n. 2. Albuterol inhaler 2 puffs every 4 hours as needed. 3. Aspirin 81 mg daily. 4. Atenolol 50 mg daily. 5. Atorvastatin 20 mg daily. 6. Dexlansoprazole 60 mg daily. 7. Potassium by mouth every 4 hours as needed. 8. Lorazepam 1 mg every 4 hours as needed.  PHYSICAL EXAMINATION:  GENERAL:  Alert male. VITAL SIGNS:  Blood pressure 142/81, pulse 83, temp 97.5. HEENT:  Eyes, PERRLA.  TMs  negative.  Oropharynx benign. NECK:  Supple.  No JVD or thyroid abnormalities. HEART:  Regular rhythm.  No murmurs.  LUNGS:  Bilateral rhonchi and wheezes. ABDOMEN:  No palpable organs or masses.  No organomegaly. EXTREMITIES:  Free of edema. NEUROLOGIC:  No focal deficit. SKIN:  Warm and dry.  ASSESSMENT:  The patient was felt to have acute bronchitis and chronic obstructive pulmonary disease, O2 sats 94%.  PLANS:  To admit for further care.  Continue nasal O2.  We will continue neb treatments as ordered.     Haleemah Buckalew G. Renard Matter, MD     AGM/MEDQ  D:  08/30/2011  T:  08/30/2011  Job:  [id number]

## 2011-08-31 MED ORDER — PNEUMOCOCCAL VAC POLYVALENT 25 MCG/0.5ML IJ INJ: 0.5000 mL | INJECTION | INTRAMUSCULAR | Status: DC

## 2011-08-31 MED ORDER — IPRATROPIUM BROMIDE 0.02 % IN SOLN
0.5000 mg | RESPIRATORY_TRACT | Status: DC
  Administered 2011-08-31 – 2011-09-04 (×16): 0.5 mg via RESPIRATORY_TRACT
  Filled 2011-08-31 (×16): qty 2.5

## 2011-08-31 MED ORDER — ALBUTEROL SULFATE (5 MG/ML) 0.5% IN NEBU
2.5000 mg | INHALATION_SOLUTION | RESPIRATORY_TRACT | Status: DC
  Administered 2011-08-31 – 2011-09-04 (×16): 2.5 mg via RESPIRATORY_TRACT
  Filled 2011-08-31 (×16): qty 0.5

## 2011-08-31 NOTE — Progress Notes (Signed)
Trevor Berg, Trevor Berg                 ACCOUNT NO.:  [id number]  MEDICAL RECORD NO.:  [id number]  LOCATION:  A313                          FACILITY:  [facility]  PHYSICIAN:  Ahmet Schank G. Renard Matter, MD   DATE OF BIRTH:  1951-02-02  DATE OF PROCEDURE: DATE OF DISCHARGE:                                PROGRESS NOTE   This patient was admitted with increased respiratory distress, cough, exacerbation of COPD.  He remains on IV fluid, Solu-Medrol every 6 hours, Avelox 400 mg daily, and nebulizer treatments with albuterol every 12 hours.  OBJECTIVE:  VITAL SIGNS:  Blood pressure 125/79, respirations 20, pulse 73, temp 97.3. LUNGS:  Occasional rhonchus heard over lower lung field. HEART:  Regular rhythm. ABDOMEN:  No palpable organs or masses.  ASSESSMENT:  The patient admitted with bronchitis and exacerbation of chronic obstructive pulmonary disease.  PLAN:  To continue current above medications, continue current regimen.     Trevor Berg G. Renard Matter, MD     AGM/MEDQ  D:  08/31/2011  T:  08/31/2011  Job:  [id number]

## 2011-09-01 MED ORDER — MAGNESIUM HYDROXIDE 400 MG/5ML PO SUSP
15.0000 mL | Freq: Every day | ORAL | Status: DC | PRN
  Administered 2011-09-01: 15 mL via ORAL
  Filled 2011-09-01: qty 30

## 2011-09-01 NOTE — Progress Notes (Signed)
Trevor Berg, Trevor Berg                 ACCOUNT NO.:  [id number]  MEDICAL RECORD NO.:  [id number]  LOCATION:  A313                          FACILITY:  [facility]  PHYSICIAN:  Ellsie Violette G. Renard Matter, MD   DATE OF BIRTH:  [birth year]  DATE OF PROCEDURE: DATE OF DISCHARGE:                                PROGRESS NOTE   This patient continues to have some upper respiratory congestion and occasional cough.  Does have exacerbation of COPD.  Remains on IV fluids, Solu-Medrol, Avelox 400 mg daily and nebulizer treatment of albuterol.  OBJECTIVE:  VITAL SIGNS:  Blood pressure 155/79, respirations 18, pulse 71, temp 98.3. LUNGS:  Diminished breath sounds.  Occasional rhonchus. HEART:  Regular rhythm. ABDOMEN:  No palpable organs or masses.  ASSESSMENT:  The patient was admitted with bronchitis, exacerbation of chronic obstructive pulmonary disease.  PLAN:  To continue above stated medications.  Continue current regimen.     [name], MD     AGM/MEDQ  D:  09/01/2011  T:  09/01/2011  Job:  [id number]

## 2011-09-02 MED ORDER — BIOTENE DRY MOUTH MT LIQD
15.0000 mL | Freq: Two times a day (BID) | OROMUCOSAL | Status: DC
  Administered 2011-09-02 – 2011-09-03 (×3): 15 mL via OROMUCOSAL

## 2011-09-02 NOTE — Progress Notes (Signed)
NAMEISHAQ, MAFFEI                 ACCOUNT NO.:  [id number]  MEDICAL RECORD NO.:  [id number]  LOCATION:  A313                          FACILITY:  [facility]  PHYSICIAN:  Suman Trivedi G. Renard Matter, MD   DATE OF BIRTH:  09-17-1951  DATE OF PROCEDURE: DATE OF DISCHARGE:                                PROGRESS NOTE   This patient continues to cough some and does have upper respiratory congestion.  Does have exacerbation of COPD.  He remains on IV fluids, Solu-Medrol, Avelox 400 mg daily and nebulizer treatments.  Overall, condition has improved.  OBJECTIVE:  VITAL SIGNS:  Blood pressure 128/78, respiration 19, pulse 68, temp 97.6. LUNGS:  Expiratory wheezes. HEART:  Regular rhythm. ABDOMEN:  No palpable organs or masses.  ASSESSMENT:  The patient does have bronchitis and exacerbation of chronic obstructive pulmonary disease.  PLAN:  To continue current medications.     Carol Theys G. Renard Matter, MD     AGM/MEDQ  D:  09/02/2011  T:  09/02/2011  Job:  [id number]

## 2011-09-03 MED ORDER — SODIUM CHLORIDE 0.9 % IJ SOLN
INTRAMUSCULAR | Status: AC
  Administered 2011-09-03: 10 mL
  Filled 2011-09-03: qty 3

## 2011-09-03 MED ORDER — SODIUM CHLORIDE 0.9 % IJ SOLN
INTRAMUSCULAR | Status: AC
  Filled 2011-09-03: qty 3

## 2011-09-03 NOTE — Progress Notes (Signed)
NAME:  Trevor, Berg                 ACCOUNT NO.:  [id number]  MEDICAL RECORD NO.:  [id number]  LOCATION:  A313                          FACILITY:  [facility]  PHYSICIAN:  Purcell Nails, MD DATE OF BIRTH:  10-09-1951  DATE OF PROCEDURE: DATE OF DISCHARGE:                                PROGRESS NOTE   CHIEF COMPLAINT:  Followup for COPD exacerbation.  SUBJECTIVE:  The patient feels better than yesterday.  He remains on his breathing treatment as well as antibiotics IV.  He is eating well.  He remains on Solu-Medrol, however, he still has cough productive of whitish sputum.  OBJECTIVE:  GENERAL:  He is alert and oriented x3, not in acute distress. VITAL SIGNS:  Blood pressure 149/85, pulse rate 68, temperature 98. HEENT:  Moist mucous membranes.  No JVD, no thyromegaly. CHEST:  Significant for bilateral scattered wheezes. CARDIOVASCULAR:  No murmur, no gallop. ABDOMEN:  Obese, otherwise nontender. EXTREMITIES:  No edema. CNS:  Nonfocal.  ASSESSMENT:  Chronic obstructive pulmonary disease exacerbation, chronic smoking, bronchitis.  PLAN:  Continue current treatment with IV antibiotics, bronchodilators, and steroid support.  The patient may be evaluated for discharge tomorrow.  His care will be resumed by Dr. Renard Matter in the morning.          ______________________________ Purcell Nails, MD     GN/MEDQ  D:  09/03/2011  T:  09/03/2011  Job:  [id number]

## 2011-09-03 NOTE — Progress Notes (Signed)
  151200 

## 2011-09-04 LAB — CBC
HCT: 38.5 % — ABNORMAL LOW (ref 39.0–52.0)
Hemoglobin: 13.5 g/dL (ref 13.0–17.0)
MCH: 32.5 pg (ref 26.0–34.0)
MCHC: 35.1 g/dL (ref 30.0–36.0)
MCV: 92.5 fL (ref 78.0–100.0)
Platelets: 203 10*3/uL (ref 150–400)
RBC: 4.16 MIL/uL — ABNORMAL LOW (ref 4.22–5.81)
RDW: 13 % (ref 11.5–15.5)
WBC: 16.3 10*3/uL — ABNORMAL HIGH (ref 4.0–10.5)

## 2011-09-04 LAB — COMPREHENSIVE METABOLIC PANEL
ALT: 40 U/L (ref 0–53)
AST: 19 U/L (ref 0–37)
Albumin: 2.9 g/dL — ABNORMAL LOW (ref 3.5–5.2)
Alkaline Phosphatase: 59 U/L (ref 39–117)
BUN: 18 mg/dL (ref 6–23)
CO2: 27 meq/L (ref 19–32)
Calcium: 8.6 mg/dL (ref 8.4–10.5)
Chloride: 103 mEq/L (ref 96–112)
Creatinine, Ser: 0.84 mg/dL (ref 0.50–1.35)
GFR calc Af Amer: >90 mL/min (ref >90)
GFR calc non Af Amer: >90 mL/min (ref >90)
Glucose, Bld: 155 mg/dL — ABNORMAL HIGH (ref 70–99)
Potassium: 3.6 mEq/L (ref 3.5–5.1)
Sodium: 139 mEq/L (ref 135–145)
Total Bilirubin: 0.2 mg/dL — ABNORMAL LOW (ref 0.3–1.2)
Total Protein: 5.7 g/dL — ABNORMAL LOW (ref 6.0–8.3)

## 2011-09-04 MED ORDER — SODIUM CHLORIDE 0.9 % IJ SOLN
INTRAMUSCULAR | Status: AC
  Filled 2011-09-04: qty 3

## 2011-09-04 NOTE — Progress Notes (Signed)
Patient d/c home with family, left floor via walking accompanied by staff No c/o of pain at d/c D/c instructions provided by 3rd shift nurse, I provided care notes about COPD to patient and wife verbalized understanding of COPD teaching and information provided. Patient to call and make follow up appt with PMD Trevor Berg, Trevor Berg

## 2011-09-04 NOTE — Discharge Instructions (Addendum)
 Acute Respiratory Distress Syndrome  Acute respiratory distress syndrome (ARDS) is a severe lung disease that causes breathing failure. It is a condition that can happen in critically ill persons with underlying illnesses either directly or indirectly. It is a life-threatening condition that causes an inflammation and a large fluid buildup in both lungs. These things prevent the lungs from getting enough oxygen and can cause damage in the body's vital organs. CAUSES  The exact cause of ARDS is not well known. However, ARDS can happen:  In people with or without a lung disease.   After a serious bodily injury (trauma) or blood clots to the lungs.   When seriously ill.   When someone has a serious infection.   After a major surgery, drug overdose or reaction, smoke inhalation, or lung transplant surgery.  SYMPTOMS  The major signs and symptoms of ARDS are:   It comes on quickly (rapid onset). It can occur within 24 to 48 hours of infection, illness, or trauma.   A bluish skin color (due to a low level of oxygen in the blood).   Shortness of breath.   Fast, difficult breathing.   Fast or irregular pulse rate.  DIAGNOSIS  Your caregiver diagnoses ARDS when:   A person suffering from severe infection or injury develops breathing problems.   A chest X-ray shows fluid in the air sacs of both lungs.   Blood tests show a low level of oxygen in the blood.   Other conditions that could cause breathing problems have been ruled out.  PREVENTION  ARDS is hard to predict and prevent. Early and correct treatment for serious illness, infection, and trauma is important. Careful attention by your caregiver to body fluid balance, salts, and chemicals in the bloodstream (electrolytes) and to oxygen treatment is also important.  RELATED COMPLICATIONS Anyone in the hospital for a long time can have complications. Complications of ARDS include:  Infections in the body and bloodstream (sepsis).    Shock.   Failure of vital organs that can lead to death.   Lasting (chronic) lung disease with impaired function.   Scarring of the lungs (pulmonary fibrosis).   A leak in the lungs causing a lung to collapse (pneumothorax).   Memory problems from low oxygen levels.   Blood clots in the legs or lungs.   Intestinal problems, hemorrhage, ulcers, or perforation.   Heart problems, arrhythmias, and abnormal function.   Kidney failure.   Malnutrition.   Death in 50% to 60% of ARDS when severe.  TREATMENT   Patients with ARDS are treated in the intensive care unit of a hospital.   Treatment of the original cause of ARDS (infection, illness, or trauma).   Oxygen is the main treatment. Sometimes a face mask is used to deliver oxygen. Often a breathing machine (ventilator) is needed to keep the oxygen content of the blood at a safe level. The ventilator allows your caregiver to control the air pressure in your lungs to lessen damage.   IV fluids and medications are used to treat the lung disease as well as the underlying condition.   Treatments are also used to prevent the complications.  HOME CARE INSTRUCTIONS  After recovering from ARDS, you may have weakness, persistent shortness of breath, or memory problems. You may also suffer from depression or from complications of the illness that caused ARDS. You can do several things to help your recovery:  Do not smoke.   Ask your caregiver about lung rehabilitation programs.  Ask your caregiver about local support groups for people with breathing trouble.   Ask friends and family to help you if usual daily activities tire you out.   Ask your caregiver for help with any persistent symptoms.  SEEK MEDICAL CARE IF:   You develop worsening shortness of breath.   Your ability to walk on level ground gets worse instead of better over time.   You develop a cough.  SEEK IMMEDIATE MEDICAL CARE IF:   An unexplained oral  temperature above 102 F (38.9 C) develops.   You suddenly become short of breath with or without chest pain.   One of your legs becomes swollen and painful.   You have chest pain.   You were exposed to smoke inhalation.   You have trauma to your chest or any other part of your body.   You overdose or have a reaction to drugs.  MAKE SURE YOU:   Understand these instructions.   Will watch your condition.   Will get help right away if you are not doing well or get worse.  Document Released: 10/16/2005 Document Revised: 05/01/2011 Document Reviewed: 08/07/2008 [employer] Patient Information 2012 [employer], [place] Respiratory Distress Syndrome  Acute respiratory distress syndrome (ARDS) is a severe lung disease that causes breathing failure. It is a condition that can happen in critically ill persons with underlying illnesses either directly or indirectly. It is a life-threatening condition that causes an inflammation and a large fluid buildup in both lungs. These things prevent the lungs from getting enough oxygen and can cause damage in the body's vital organs. CAUSES  The exact cause of ARDS is not well known. However, ARDS can happen:  In people with or without a lung disease.   After a serious bodily injury (trauma) or blood clots to the lungs.   When seriously ill.   When someone has a serious infection.   After a major surgery, drug overdose or reaction, smoke inhalation, or lung transplant surgery.  SYMPTOMS  The major signs and symptoms of ARDS are:   It comes on quickly (rapid onset). It can occur within 24 to 48 hours of infection, illness, or trauma.   A bluish skin color (due to a low level of oxygen in the blood).   Shortness of breath.   Fast, difficult breathing.   Fast or irregular pulse rate.  DIAGNOSIS  Your caregiver diagnoses ARDS when:   A person suffering from severe infection or injury develops breathing problems.   A chest X-ray shows fluid  in the air sacs of both lungs.   Blood tests show a low level of oxygen in the blood.   Other conditions that could cause breathing problems have been ruled out.  PREVENTION  ARDS is hard to predict and prevent. Early and correct treatment for serious illness, infection, and trauma is important. Careful attention by your caregiver to body fluid balance, salts, and chemicals in the bloodstream (electrolytes) and to oxygen treatment is also important.  RELATED COMPLICATIONS Anyone in the hospital for a long time can have complications. Complications of ARDS include:  Infections in the body and bloodstream (sepsis).   Shock.   Failure of vital organs that can lead to death.   Lasting (chronic) lung disease with impaired function.   Scarring of the lungs (pulmonary fibrosis).   A leak in the lungs causing a lung to collapse (pneumothorax).   Memory problems from low oxygen levels.   Blood clots in the legs or lungs.  Intestinal problems, hemorrhage, ulcers, or perforation.   Heart problems, arrhythmias, and abnormal function.   Kidney failure.   Malnutrition.   Death in 50% to 60% of ARDS when severe.  TREATMENT   Patients with ARDS are treated in the intensive care unit of a hospital.   Treatment of the original cause of ARDS (infection, illness, or trauma).   Oxygen is the main treatment. Sometimes a face mask is used to deliver oxygen. Often a breathing machine (ventilator) is needed to keep the oxygen content of the blood at a safe level. The ventilator allows your caregiver to control the air pressure in your lungs to lessen damage.   IV fluids and medications are used to treat the lung disease as well as the underlying condition.   Treatments are also used to prevent the complications.  HOME CARE INSTRUCTIONS  After recovering from ARDS, you may have weakness, persistent shortness of breath, or memory problems. You may also suffer from depression or from  complications of the illness that caused ARDS. You can do several things to help your recovery:  Do not smoke.   Ask your caregiver about lung rehabilitation programs.   Ask your caregiver about local support groups for people with breathing trouble.   Ask friends and family to help you if usual daily activities tire you out.   Ask your caregiver for help with any persistent symptoms.  SEEK MEDICAL CARE IF:   You develop worsening shortness of breath.   Your ability to walk on level ground gets worse instead of better over time.   You develop a cough.  SEEK IMMEDIATE MEDICAL CARE IF:   An unexplained oral temperature above 102 F (38.9 C) develops.   You suddenly become short of breath with or without chest pain.   One of your legs becomes swollen and painful.   You have chest pain.   You were exposed to smoke inhalation.   You have trauma to your chest or any other part of your body.   You overdose or have a reaction to drugs.  MAKE SURE YOU:   Understand these instructions.   Will watch your condition.   Will get help right away if you are not doing well or get worse.  Document Released: 10/16/2005 Document Revised: 05/01/2011 Document Reviewed: 08/07/2008 [facility] Patient Information 2012 [place], [place].Marland Kitchen

## 2011-09-04 NOTE — Progress Notes (Signed)
Patient discharged per MD order. Patient given discharge instructions. Discharge instructions given and patient verbalized understanding.  Told patient to return to MD or hospital if patient has shortness of breath.  Patient stable and vital signs wnl.

## 2011-09-04 NOTE — Discharge Summary (Signed)
NAMEDRE, GAMINO                 ACCOUNT NO.:  [id number]  MEDICAL RECORD NO.:  [id number]  LOCATION:  A313                          FACILITY:  [facility]  PHYSICIAN:  Idania Desouza G. Renard Matter, MD   DATE OF BIRTH:  16-Jul-1951  DATE OF ADMISSION:  08/29/2011 DATE OF DISCHARGE:  LH                              DISCHARGE SUMMARY   This is a 60 year old white male, admitted on August 29, 2011, discharged on September 04, 2011, 5 days' hospitalization.  DIAGNOSES: 1. Acute bronchitis and chronic obstructive pulmonary disease. 2. History of gastroesophageal reflux disease. 3. Coronary artery disease. 4. Hypertension.  CONDITION:  Stable at the time of his discharge.  HISTORY OF PRESENT ILLNESS:  This is a 60 year old white male presented to the emergency department, complaining of shortness of breath and cough, which had been present over a period of several days.  He does have a history of chronic bronchitis and COPD, hypertension, hyperlipidemia, and acid reflux.  X-ray of chest on admission showed chronic bronchitic changes, but no pneumonia.  He did have slightly low potassium at 3.2.  PHYSICAL EXAMINATION:  GENERAL:  Alert male. VITAL SIGNS:  Blood pressure 142/81, pulse 83, temp 97.5. HEENT:  Eyes, PERRLA.  TM negative.  Oropharynx benign. NECK:  Supple.  No JVD or thyroid abnormalities. HEART:  Regular rhythm.  No murmurs. LUNGS:  Bilateral rhonchi and wheezes. ABDOMEN:  No palpable organs or masses.  No organomegaly. EXTREMITIES:  Free of edema. NEUROLOGIC:  No focal deficit.  LAB DATA:  Admission CBC:  WBC 10,300 with hemoglobin 15.2, hematocrit 44.8.  Most recent chemistries, sodium 139, potassium 3.6, chloride 103, CO2 27, glucose 155, BUN 18, creatinine 0.84, calcium 8.6, total protein 5.7, albumin 2.9.  AST 19, ALT 40, alkaline phosphatase 59, and bilirubin 0.2.  Chest x-ray on admission, mild chronic bronchitic changes.  HOSPITAL COURSE:  The patient, on admission, was  placed on IV fluids. Normal saline, KVO rate.  Placed on regular diet.  He was placed on heart-healthy diet.  He was started on IV Avelox 400 mg every 24 hours, nebulizer treatments every 4 hours while awake.  He was continued on aspirin 81 mg daily, Tenormin 50 mg daily, Protonix 40 mg daily, simvastatin 40 mg daily.  He was given p.r.n. Tylenol 650 mg every 6 hours and lorazepam 1 mg t.i.d. p.r.n.  The patient slowly improved throughout his hospitalization, but continue to cough intermittently with intermittent wheezing, but this gradually improved.  He was given Solu-Medrol every 6 hours and neb treatments.  He still has productive cough.  On the fifth hospital day, it was felt that he was well enough to return home.  He was discharged on the following medications: 1. ProAir HFA q.4 hours p.r.n. 2. Aspirin 81 mg. 3. Tenormin 50 mg daily. 4. Lipitor 20 mg daily. 5. Dexilant 60 mg daily. 6. Avelox 400 mg daily.  The patient was stable at the time of his discharge.     Diyari Cherne G. Renard Matter, MD     AGM/MEDQ  D:  09/04/2011  T:  09/04/2011  Job:  [id number]

## 2012-04-01 ENCOUNTER — Other Ambulatory Visit (HOSPITAL_COMMUNITY): Payer: Self-pay | Admitting: Family Medicine

## 2012-04-01 ENCOUNTER — Ambulatory Visit (HOSPITAL_COMMUNITY)
Admission: RE | Admit: 2012-04-01 | Discharge: 2012-04-01 | Disposition: A | Discharge status: Home / Self Care | Payer: Medicare Other | Source: Ambulatory Visit | Attending: Family Medicine | Admitting: Family Medicine

## 2012-04-01 DIAGNOSIS — R0989 Other specified symptoms and signs involving the circulatory and respiratory systems: Secondary | ICD-10-CM | POA: Insufficient documentation

## 2012-04-01 DIAGNOSIS — J449 Chronic obstructive pulmonary disease, unspecified: Secondary | ICD-10-CM

## 2012-04-01 DIAGNOSIS — R0600 Dyspnea, unspecified: Secondary | ICD-10-CM

## 2012-04-01 DIAGNOSIS — I2510 Atherosclerotic heart disease of native coronary artery without angina pectoris: Secondary | ICD-10-CM

## 2012-04-01 DIAGNOSIS — R079 Chest pain, unspecified: Secondary | ICD-10-CM

## 2012-04-01 DIAGNOSIS — R0609 Other forms of dyspnea: Secondary | ICD-10-CM | POA: Insufficient documentation

## 2012-04-01 DIAGNOSIS — J4489 Other specified chronic obstructive pulmonary disease: Secondary | ICD-10-CM | POA: Insufficient documentation

## 2012-06-06 ENCOUNTER — Emergency Department (HOSPITAL_COMMUNITY)
Admission: EM | Admit: 2012-06-06 | Discharge: 2012-06-06 | Disposition: A | Discharge status: Home / Self Care | Payer: Medicare Other | Attending: Emergency Medicine | Admitting: Emergency Medicine

## 2012-06-06 ENCOUNTER — Emergency Department (HOSPITAL_COMMUNITY): Payer: Medicare Other

## 2012-06-06 ENCOUNTER — Encounter (HOSPITAL_COMMUNITY): Payer: Self-pay | Admitting: *Deleted

## 2012-06-06 DIAGNOSIS — S20219A Contusion of unspecified front wall of thorax, initial encounter: Secondary | ICD-10-CM

## 2012-06-06 DIAGNOSIS — R079 Chest pain, unspecified: Secondary | ICD-10-CM | POA: Insufficient documentation

## 2012-06-06 HISTORY — DX: Chronic obstructive pulmonary disease, unspecified: J44.9

## 2012-06-06 MED ORDER — HYDROCODONE-ACETAMINOPHEN 5-325 MG PO TABS
2.0000 | ORAL_TABLET | Freq: Once | ORAL | Status: AC
  Administered 2012-06-06: 2 via ORAL
  Filled 2012-06-06: qty 2

## 2012-06-06 MED ORDER — ONDANSETRON HCL 4 MG PO TABS
4.0000 mg | ORAL_TABLET | Freq: Once | ORAL | Status: AC
  Administered 2012-06-06: 4 mg via ORAL
  Filled 2012-06-06: qty 1

## 2012-06-06 MED ORDER — HYDROCODONE-ACETAMINOPHEN 7.5-325 MG PO TABS: 1.0000 | ORAL_TABLET | ORAL | Status: AC | PRN

## 2012-06-06 MED ORDER — METHOCARBAMOL 500 MG PO TABS: ORAL_TABLET | ORAL | Status: DC

## 2012-06-06 MED ORDER — DIAZEPAM 5 MG PO TABS
5.0000 mg | ORAL_TABLET | Freq: Once | ORAL | Status: AC
  Administered 2012-06-06: 5 mg via ORAL
  Filled 2012-06-06: qty 1

## 2012-06-06 MED ORDER — MELOXICAM 7.5 MG PO TABS: ORAL_TABLET | ORAL | Status: DC

## 2012-06-06 MED ORDER — KETOROLAC TROMETHAMINE 10 MG PO TABS
10.0000 mg | ORAL_TABLET | Freq: Once | ORAL | Status: AC
  Administered 2012-06-06: 10 mg via ORAL
  Filled 2012-06-06: qty 1

## 2012-06-06 NOTE — ED Notes (Signed)
Pt in xray

## 2012-06-06 NOTE — ED Notes (Signed)
Fell when showering this am .Pain rt ribs.

## 2012-06-06 NOTE — Discharge Instructions (Signed)
 Your x-ray is negative for rib fracture or dislocation. Please use the incentive spirometer 4 times daily. Norco every 4 hours if needed for pain. Robaxin 3 times daily for spasm pain. Mobic for inflammation and swelling. Please see your primary physician if pain is not resolving.Rib Contusion A rib contusion (bruise) can occur by a blow to the chest or by a fall against a hard object. Usually these will be much better in a couple weeks. If X-rays were taken today and there are no broken bones (fractures), the diagnosis of bruising is made. However, broken ribs may not show up for several days, or may be discovered later on a routine X-ray when signs of healing show up. If this happens to you, it does not mean that something was missed on the X-ray, but simply that it did not show up on the first X-rays. Earlier diagnosis will not usually change the treatment. HOME CARE INSTRUCTIONS   Avoid strenuous activity. Be careful during activities and avoid bumping the injured ribs. Activities that pull on the injured ribs and cause pain should be avoided, if possible.   For the first day or two, an ice pack used every 20 minutes while awake may be helpful. Put ice in a plastic bag and put a towel between the bag and the skin.   Eat a normal, well-balanced diet. Drink plenty of fluids to avoid constipation.   Take deep breaths several times a day to keep lungs free of infection. Try to cough several times a day. Splint the injured area with a pillow while coughing to ease pain. Coughing can help prevent pneumonia.   Wear a rib belt or binder only if told to do so by your caregiver. If you are wearing a rib belt or binder, you must do the breathing exercises as directed by your caregiver. If not used properly, rib belts or binders restrict breathing which can lead to pneumonia.   Only take over-the-counter or prescription medicines for pain, discomfort, or fever as directed by your caregiver.  SEEK MEDICAL CARE  IF:   You or your child has an oral temperature above 102 F (38.9 C).   Your baby is older than 3 months with a rectal temperature of 100.5 F (38.1 C) or higher for more than 1 day.   You develop a cough, with thick or bloody sputum.  SEEK IMMEDIATE MEDICAL CARE IF:   You have difficulty breathing.   You feel sick to your stomach (nausea), have vomiting or belly (abdominal) pain.   You have worsening pain, not controlled with medications, or there is a change in the location of the pain.   You develop sweating or radiation of the pain into the arms, jaw or shoulders, or become light headed or faint.   You or your child has an oral temperature above 102 F (38.9 C), not controlled by medicine.   Your or your baby is older than 3 months with a rectal temperature of 102 F (38.9 C) or higher.   Your baby is 19 months old or younger with a rectal temperature of 100.4 F (38 C) or higher.  MAKE SURE YOU:   Understand these instructions.   Will watch your condition.   Will get help right away if you are not doing well or get worse.  Document Released: 07/11/2001 Document Revised: 10/05/2011 Document Reviewed: 06/03/2008 [facility] Patient Information 2012 [place], [place].

## 2012-07-15 NOTE — ED Provider Notes (Signed)
History     CSN: [id number]  Arrival date & time 06/06/12  1624   None     Chief Complaint  Patient presents with  . Fall    (Consider location/radiation/quality/duration/timing/severity/associated sxs/prior treatment) Patient is a 61 y.o. male presenting with fall. The history is provided by the patient.  Fall The accident occurred 1 to 2 hours ago. The fall occurred while standing (Pt fell in the shower). He landed on a hard floor (fell on bath tub and floor). There was no blood loss. Point of impact: right ribs. Pain location: right ribs. The pain is moderate. He was ambulatory at the scene. There was no alcohol use involved in the accident. Pertinent negatives include no abdominal pain, no bowel incontinence, no nausea, no vomiting, no hematuria and no loss of consciousness. Associated symptoms comments: Right rib and flank pain. Exacerbated by: cough, deep breath, palpation of right flank. He has tried nothing for the symptoms.    Past Medical History  Diagnosis Date  . Hypertension   . Hypercholesteremia   . Bronchitis, chronic   . Anxiety   . Acid reflux   . Acute angina   . COPD (chronic obstructive pulmonary disease)     Past Surgical History  Procedure Date  . Cervical spine surgery   . Ankle surgery bilateral  . Appendectomy   . Tonsillectomy     History reviewed. No pertinent family history.  History  Substance Use Topics  . Smoking status: Current Every Day Smoker -- 2.0 packs/day for 50 years    Types: Cigarettes  . Smokeless tobacco: Not on file  . Alcohol Use: 1.2 oz/week    2 Cans of beer per week     occasionally      Review of Systems  Constitutional: Negative for activity change.       All ROS Neg except as noted in HPI  HENT: Negative for nosebleeds and neck pain.   Eyes: Negative for photophobia and discharge.  Respiratory: Negative for cough, shortness of breath and wheezing.   Cardiovascular: Negative for chest pain and palpitations.    Gastrointestinal: Negative for nausea, vomiting, abdominal pain, blood in stool and bowel incontinence.  Genitourinary: Negative for dysuria, frequency and hematuria.  Musculoskeletal: Negative for back pain and arthralgias.  Skin: Negative.   Neurological: Negative for dizziness, seizures, loss of consciousness and speech difficulty.  Psychiatric/Behavioral: Negative for hallucinations and confusion.    Allergies  Tomato  Home Medications   Current Outpatient Rx  Name Route Sig Dispense Refill  . ACETAMINOPHEN ER 650 MG PO TBCR Oral Take 650 mg by mouth daily as needed. For headache pain     . ALBUTEROL SULFATE HFA 108 (90 BASE) MCG/ACT IN AERS Inhalation Inhale 2 puffs into the lungs every 4 (four) hours as needed. For shortness of breath     . ASPIRIN 325 MG PO TABS Oral Take 325 mg by mouth daily.    . ATENOLOL 50 MG PO TABS Oral Take 50 mg by mouth every morning.     . ATORVASTATIN CALCIUM 20 MG PO TABS Oral Take 20 mg by mouth every morning.     . DEXLANSOPRAZOLE 60 MG PO CPDR Oral Take 60 mg by mouth every morning.     . CORICIDIN HBP CONGESTION/COUGH PO Oral Take 2 tablets by mouth every 4 (four) hours as needed. For bronchitis     . GUAIFENESIN ER 1200 MG PO TB12 Oral Take 1 tablet by mouth daily.    Marland Kitchen  HYDROCODONE-HOMATROPINE 5-1.5 MG/5ML PO SYRP Oral Take 5 mLs by mouth every 4 (four) hours as needed. For cough    . LORAZEPAM 1 MG PO TABS Oral Take 1 mg by mouth every 4 (four) hours as needed. For nerves    . NITROGLYCERIN 0.4 MG SL SUBL Sublingual Place 0.4 mg under the tongue every 5 (five) minutes as needed.    Marland Kitchen TIOTROPIUM BROMIDE MONOHYDRATE 18 MCG IN CAPS Inhalation Place 18 mcg into inhaler and inhale every morning.    Marland Kitchen MELOXICAM 7.5 MG PO TABS  1 po bid with food 14 tablet 0  . METHOCARBAMOL 500 MG PO TABS  2 po tid for spasm 30 tablet 0    BP 155/86  Pulse 75  Temp 98.7 F (37.1 C) (Oral)  Resp 18  Ht 5\' 7"  (1.702 m)  Wt 195 lb (88.451 kg)  BMI 30.54 kg/m2   SpO2 96%  Physical Exam  Nursing note and vitals reviewed. Constitutional: He is oriented to person, place, and time. He appears well-developed and well-nourished.  Non-toxic appearance.  HENT:  Head: Normocephalic.  Right Ear: Tympanic membrane and external ear normal.  Left Ear: Tympanic membrane and external ear normal.  Eyes: EOM and lids are normal. Pupils are equal, round, and reactive to light.  Neck: Normal range of motion. Neck supple. Carotid bruit is not present.  Cardiovascular: Normal rate, regular rhythm, normal heart sounds, intact distal pulses and normal pulses.   Pulmonary/Chest: Breath sounds normal. No respiratory distress.       Right chest wall and flank pain.Bruise present. No crepitus.  Abdominal: Soft. Bowel sounds are normal. There is no tenderness. There is no guarding.  Musculoskeletal: Normal range of motion.  Lymphadenopathy:       Head (right side): No submandibular adenopathy present.       Head (left side): No submandibular adenopathy present.    He has no cervical adenopathy.  Neurological: He is alert and oriented to person, place, and time. He has normal strength. No cranial nerve deficit or sensory deficit.  Skin: Skin is warm and dry.  Psychiatric: He has a normal mood and affect. His speech is normal.    ED Course  Procedures (including critical care time)  Labs Reviewed - No data to display No results found. Pulse ox 96% on room air. WNL by my interpretation.  1. Contusion of ribs       MDM  I have reviewed nursing notes, vital signs, and all appropriate lab and imaging results for this patient. Xray of the right ribs and chest are neg for fx or dislocation. Rx for mobic, robaxin and norco given to the patient. Pt strongly encouraged to cough and deep breathe frequently.       Kathie Dike, [place] 07/22/12 [phone]

## 2012-07-24 NOTE — ED Provider Notes (Signed)
Medical screening examination/treatment/procedure(s) were performed by non-physician practitioner and as supervising physician I was immediately available for consultation/collaboration.   Dione Booze, MD 07/24/12 1539

## 2012-09-06 ENCOUNTER — Other Ambulatory Visit (HOSPITAL_COMMUNITY): Payer: Self-pay | Admitting: Family Medicine

## 2012-09-06 DIAGNOSIS — R1310 Dysphagia, unspecified: Secondary | ICD-10-CM

## 2012-09-10 ENCOUNTER — Ambulatory Visit (HOSPITAL_COMMUNITY)
Admission: RE | Admit: 2012-09-10 | Discharge: 2012-09-10 | Disposition: A | Discharge status: Home / Self Care | Payer: Medicare Other | Source: Ambulatory Visit | Attending: Family Medicine | Admitting: Family Medicine

## 2012-09-10 DIAGNOSIS — I10 Essential (primary) hypertension: Secondary | ICD-10-CM | POA: Insufficient documentation

## 2012-09-10 DIAGNOSIS — J4489 Other specified chronic obstructive pulmonary disease: Secondary | ICD-10-CM | POA: Insufficient documentation

## 2012-09-10 DIAGNOSIS — J449 Chronic obstructive pulmonary disease, unspecified: Secondary | ICD-10-CM | POA: Insufficient documentation

## 2012-09-10 DIAGNOSIS — R1011 Right upper quadrant pain: Secondary | ICD-10-CM | POA: Insufficient documentation

## 2012-09-10 DIAGNOSIS — K219 Gastro-esophageal reflux disease without esophagitis: Secondary | ICD-10-CM | POA: Insufficient documentation

## 2012-09-10 DIAGNOSIS — R1310 Dysphagia, unspecified: Secondary | ICD-10-CM | POA: Insufficient documentation

## 2012-09-12 ENCOUNTER — Other Ambulatory Visit (HOSPITAL_COMMUNITY): Payer: Medicare Other

## 2012-09-12 ENCOUNTER — Ambulatory Visit (HOSPITAL_COMMUNITY)
Admission: RE | Admit: 2012-09-12 | Discharge: 2012-09-12 | Disposition: A | Discharge status: Home / Self Care | Payer: Medicare Other | Source: Ambulatory Visit | Attending: Family Medicine | Admitting: Family Medicine

## 2012-09-12 DIAGNOSIS — R1310 Dysphagia, unspecified: Secondary | ICD-10-CM | POA: Insufficient documentation

## 2012-09-20 ENCOUNTER — Encounter: Payer: Self-pay | Admitting: Internal Medicine

## 2012-09-23 ENCOUNTER — Encounter: Payer: Self-pay | Admitting: Gastroenterology

## 2012-09-23 ENCOUNTER — Ambulatory Visit (INDEPENDENT_AMBULATORY_CARE_PROVIDER_SITE_OTHER): Payer: Medicare Other | Admitting: Gastroenterology

## 2012-09-23 VITALS — BP 131/78 | HR 80 | Temp 97.2°F | Ht 66.0 in | Wt 215.2 lb

## 2012-09-23 DIAGNOSIS — K219 Gastro-esophageal reflux disease without esophagitis: Secondary | ICD-10-CM

## 2012-09-23 DIAGNOSIS — R1310 Dysphagia, unspecified: Secondary | ICD-10-CM | POA: Insufficient documentation

## 2012-09-23 DIAGNOSIS — R1314 Dysphagia, pharyngoesophageal phase: Secondary | ICD-10-CM

## 2012-09-23 DIAGNOSIS — K5909 Other constipation: Secondary | ICD-10-CM | POA: Insufficient documentation

## 2012-09-23 DIAGNOSIS — R1013 Epigastric pain: Secondary | ICD-10-CM

## 2012-09-23 DIAGNOSIS — R1319 Other dysphagia: Secondary | ICD-10-CM | POA: Insufficient documentation

## 2012-09-23 DIAGNOSIS — K5900 Constipation, unspecified: Secondary | ICD-10-CM

## 2012-09-23 MED ORDER — POLYETHYLENE GLYCOL 3350 17 GM/SCOOP PO POWD: ORAL | Status: DC

## 2012-09-23 NOTE — Patient Instructions (Addendum)
Please start MiraLax for your constipation. Take 1 capful mixed in 4-6 ounces of liquid twice daily until you have soft, regular bowel movements. Then you may drop back to once daily on days you do not have a bowel movement. Prescription sent to [facility].   We have scheduled you for an upper endoscopy. Please see separate instructions.

## 2012-09-23 NOTE — Assessment & Plan Note (Signed)
EGD is planned. However suspect some of this pain is related to musculoskeletal pain from chronic coughing. If EGD does not explain his abdominal pain, he may need to have further evaluation. Last CT of abdomen and pelvis was in August of 2012. Further recommendations to follow.

## 2012-09-23 NOTE — Progress Notes (Signed)
Faxed to PCP

## 2012-09-23 NOTE — Assessment & Plan Note (Signed)
Chronic constipation. Last colonoscopy in 2005 without polyps. Family history negative for colon cancer. No significant bowel change. He needs to be on a good bowel regimen however. Start MiraLax 17 g twice a day until soft stool. Then he may take 1 capful in the evening on days he has not had a good bowel movement.  Next colonoscopy due in 2015.

## 2012-09-23 NOTE — Assessment & Plan Note (Signed)
Chronic GERD with breakthrough heartburn on Dexilant. Also with complaints of solid food dysphagia. He does not have teeth or dentures. He is primarily having problems with bread and meat. No obvious stricture on barium esophagram. He previously has done well with esophageal dilations in the past. I have offered him an upper endoscopy with dilation with Dr. Jena Gauss in the near future. Based on findings he may need to switch PPI as he is having refractory reflux but will wait until after EGD is done.  I have discussed the risks, alternatives, benefits with regards to but not limited to the risk of reaction to medication, bleeding, infection, perforation and the patient is agreeable to proceed. Written consent to be obtained.

## 2012-09-23 NOTE — Progress Notes (Signed)
Primary Care Physician:  Alice Reichert, MD  Primary Gastroenterologist:  Roetta Sessions, MD   Chief Complaint  Patient presents with  . Dysphagia  . Abdominal Pain    HPI:  Trevor Berg is a 61 y.o. male here for further evaluation of abdominal pain and dysphagia.   Food getting stuck in throat at times and has to cough it back up. At other times, food sticks in chest. Occurring for 2-3 months. Meats are really bad. Bad with breads. Avoiding both of these foods right now. Heartburn even on Dexilant. On chronically, used to work. Epigastric pain/upper abdominal pain worse with meats. Some pain without meals. Sometimes has to take laxative for constipation. Chronic constipation for years. Takes every week. May skip one or 2 days and then take a laxative. No melena, brbpr. No unintentional weight loss. He is edentulous. No dentures.  Workup thus far has included an abdominal ultrasound. He had fatty liver, with normal LFTs. Normal CBC. Upper GI with barium swallow showed mild age-related empiric esophageal motility, laryngeal penetration without aspiration. Vallecular residuals.  Current Outpatient Prescriptions  Medication Sig Dispense Refill  . acetaminophen (TYLENOL) 650 MG CR tablet Take 650 mg by mouth daily as needed. For headache pain       . albuterol (PROAIR HFA) 108 (90 BASE) MCG/ACT inhaler Inhale 2 puffs into the lungs every 4 (four) hours as needed. For shortness of breath       . aspirin 325 MG tablet Take 325 mg by mouth daily.      Marland Kitchen atenolol (TENORMIN) 50 MG tablet Take 50 mg by mouth every morning.       Marland Kitchen atorvastatin (LIPITOR) 20 MG tablet Take 20 mg by mouth every morning.       Marland Kitchen dexlansoprazole (DEXILANT) 60 MG capsule Take 60 mg by mouth every morning.       Marland Kitchen Dextromethorphan-Guaifenesin (CORICIDIN HBP CONGESTION/COUGH PO) Take 2 tablets by mouth every 4 (four) hours as needed. For bronchitis       . LORazepam (ATIVAN) 1 MG tablet Take 1 mg by mouth every 4 (four)  hours as needed. For nerves      . nitroGLYCERIN (NITROSTAT) 0.4 MG SL tablet Place 0.4 mg under the tongue every 5 (five) minutes as needed.      . tiotropium (SPIRIVA) 18 MCG inhalation capsule Place 18 mcg into inhaler and inhale every morning.        Allergies as of 09/23/2012 - Review Complete 09/23/2012  Allergen Reaction Noted  . Tomato Rash 06/16/2011    Past Medical History  Diagnosis Date  . Hypertension   . Hypercholesteremia   . Bronchitis, chronic   . Anxiety   . Acid reflux   . Acute angina   . COPD (chronic obstructive pulmonary disease)     Past Surgical History  Procedure Date  . Cervical spine surgery   . Ankle surgery bilateral  . Appendectomy   . Tonsillectomy   . Esophagogastroduodenoscopy 09/27/2004    RMR:   A couple of tiny, distal esophageal erosions, otherwise normal esophagus aside from a Schatzki ring status post Maloney dilation as described above/ Small hiatal hernia, otherwise normal stomach, normal D1-D2  . Colonoscopy 10/19/2004    RMR:   Friable anal canal, likely source of patient's hematochezia/  Otherwise normal rectum, normal colon.    Family History  Problem Relation Age of Onset  . Heart attack Mother     deceased, age 64s  . Ulcers Brother   .  Diabetes Brother   . Colon cancer Neg Hx     History   Social History  . Marital Status: Married    Spouse Name: N/A    Number of Children: 3  . Years of Education: N/A   Occupational History  . disability    Social History Main Topics  . Smoking status: Current Every Day Smoker -- 2.0 packs/day for 50 years    Types: Cigarettes  . Smokeless tobacco: Not on file  . Alcohol Use: 1.2 oz/week    2 Cans of beer per week     Comment: occasionally, usually holidays but sometimes on weekends, prior heavy use  . Drug Use: No  . Sexually Active: Not on file   Other Topics Concern  . Not on file   Social History Narrative  . No narrative on file      ROS:  General: Negative  for anorexia, weight loss, fever, chills, fatigue, weakness. Eyes: Negative for vision changes.  ENT: Positive for hoarseness, difficulty swallowing. CV: Negative for chest pain, angina, palpitations, peripheral edema. Chronic DOE. Respiratory: Negative for dyspnea at rest, sputum, wheezing. Chronic DOE/cough. GI: See history of present illness. GU:  Negative for dysuria, hematuria, urinary incontinence, urinary frequency, nocturnal urination.  MS: Negative for joint pain, low back pain.  Derm: Negative for rash or itching.  Neuro: Negative for weakness, abnormal sensation, seizure, frequent headaches, memory loss, confusion.  Psych: Negative for anxiety, depression, suicidal ideation, hallucinations.  Endo: Negative for unusual weight change.  Heme: Negative for bruising or bleeding. Allergy: Negative for rash or hives.    Physical Examination:  BP 131/78  Pulse 80  Temp 97.2 F (36.2 C) (Temporal)  Ht 5\' 6"  (1.676 m)  Wt 215 lb 3.2 oz (97.614 kg)  BMI 34.73 kg/m2   General: Well-nourished, well-developed in no acute distress. Accompanied by wife. Head: Normocephalic, atraumatic.   Eyes: Conjunctiva pink, no icterus. Mouth: Oropharyngeal mucosa moist and pink , no lesions erythema or exudate. Neck: Supple without thyromegaly, masses, or lymphadenopathy.  Lungs: Clear to auscultation bilaterally.  Heart: Regular rate and rhythm, no murmurs rubs or gallops.  Abdomen: Bowel sounds are normal, moderate epigastric tenderness and in both upper quadrants. Positive Carnett's sign. Nondistended, no hepatosplenomegaly or masses, no abdominal bruits or hernia , no rebound or guarding.   Rectal: not performed Extremities: No lower extremity edema. No clubbing or deformities.  Neuro: Alert and oriented x 4 , grossly normal neurologically.  Skin: Warm and dry, no rash or jaundice.   Psych: Alert and cooperative, normal mood and affect.  Labs: Labs from 09/06/2012. White blood cell count  8800, hemoglobin 15, platelets 229,000, total bilirubin 0.4, alkaline phosphatase 75, AST 27, ALT 25, albumin 4.3.  Imaging Studies: US Abdomen Complete  09/10/2012  *RADIOLOGY REPORT*  Clinical Data:  Right upper quadrant pain, dysphagia, acid reflux, hypertension, COPD  ULTRASOUND ABDOMEN:  Technique:  Sonography of upper abdominal structures was performed.  Comparison:  08/09/2004  Gallbladder:  Normally distended without stones or wall thickening. No pericholecystic fluid or sonographic Murphy sign.  Common bile duct:  3 mm diameter, normal  Liver:  Echogenic, likely fatty infiltration, though this can be seen with cirrhosis and certain infiltrative disorders.  Question small area of focal sparing adjacent to the gallbladder fossa.  No additional hepatic mass or nodularity.  IVC:  Normal appearance  Pancreas:  Obscured by bowel gas  Spleen:  Normal appearance, 7.6 cm length  Right kidney:  11.0 cm length.  Normal morphology without mass or hydronephrosis.  Left kidney:  11.4 cm length. Normal morphology without mass or hydronephrosis.  Aorta:  Mild atherosclerotic plaque formation, normal caliber  Other:  No free fluid  IMPRESSION: Probable fatty infiltration of liver with suspect small area of focal sparing adjacent to the gallbladder fossa. Nonvisualization of pancreas. Otherwise negative exam.   Original Report Authenticated By: Ulyses Southward, M.D.    Varney Biles Kayleen Memos W/high Density W/kub  09/12/2012  *RADIOLOGY REPORT*  Clinical Data: Difficulty swallowing liquids, solids, and pills for 3 months, feeling like things stick in throat, gets choked  UPPER GI SERIES WITH AIR/HIGH DENSITY BARIUM AND KUB:  Technique: Routine air contrast upper GI series performed following ingestion of effervescent granules and high density barium.  Scout image was performed.  Fluoroscopy time:  4.0 minutes  Comparison:  Abdominal radiographs 06/16/2011  Findings: Normal bowel gas pattern on scout image. Normal esophageal distention  without esophageal mass or stricture. 12.5 mm diameter barium tablet passes oral cavity to stomach without delay. Mild age-related impairment esophageal motility. Targeted rapid sequence imaging of the cervical esophagus hypopharynx reveals laryngeal penetration without aspiration. Patient had an unwitnessed episode of coughing following swallowing of water. Vallecular residuals were noted. Prior cervical spine fusion noted. No esophageal filling defects identified. Smooth appearance of esophageal mucosa on air-contrast imaging without irregularity or ulceration.  Stomach distends normally without gross mass or ulceration. Rugal folds grossly normal appearance. No gastric outlet obstruction. Small duodenal diverticulum noted. Duodenal bulb and sweep otherwise normal appearance. Ligament Treitz normal position. Visualized jejunal loops unremarkable.  IMPRESSION: Laryngeal penetration without aspiration. Vallecular residuals. No evidence of esophageal obstruction or mass. Remainder of exam unremarkable.   Original Report Authenticated By: Ulyses Southward, M.D.

## 2012-09-24 ENCOUNTER — Encounter (HOSPITAL_COMMUNITY): Payer: Self-pay | Admitting: Pharmacy Technician

## 2012-10-07 ENCOUNTER — Encounter (HOSPITAL_COMMUNITY)
Admission: RE | Disposition: A | Discharge status: Nursing Home (Includes ICF) | Payer: Self-pay | Source: Ambulatory Visit | Attending: Internal Medicine

## 2012-10-07 ENCOUNTER — Encounter (HOSPITAL_COMMUNITY): Payer: Self-pay | Admitting: *Deleted

## 2012-10-07 ENCOUNTER — Telehealth: Payer: Self-pay | Admitting: Gastroenterology

## 2012-10-07 ENCOUNTER — Encounter (HOSPITAL_COMMUNITY): Payer: Self-pay | Admitting: Emergency Medicine

## 2012-10-07 ENCOUNTER — Other Ambulatory Visit (HOSPITAL_COMMUNITY): Payer: Self-pay | Admitting: Family Medicine

## 2012-10-07 ENCOUNTER — Ambulatory Visit (HOSPITAL_COMMUNITY)
Admission: RE | Admit: 2012-10-07 | Discharge: 2012-10-07 | Disposition: A | Discharge status: Home / Self Care | Payer: Medicare Other | Source: Ambulatory Visit | Attending: Family Medicine | Admitting: Family Medicine

## 2012-10-07 ENCOUNTER — Ambulatory Visit (HOSPITAL_COMMUNITY)
Admission: RE | Admit: 2012-10-07 | Discharge: 2012-10-07 | Disposition: A | Discharge status: Nursing Home (Includes ICF) | Payer: Medicare Other | Source: Ambulatory Visit | Attending: Internal Medicine | Admitting: Internal Medicine

## 2012-10-07 ENCOUNTER — Emergency Department (HOSPITAL_COMMUNITY)
Admission: EM | Admit: 2012-10-07 | Discharge: 2012-10-07 | Disposition: A | Discharge status: Home / Self Care | Payer: Medicare Other | Attending: Emergency Medicine | Admitting: Emergency Medicine

## 2012-10-07 DIAGNOSIS — F17200 Nicotine dependence, unspecified, uncomplicated: Secondary | ICD-10-CM | POA: Insufficient documentation

## 2012-10-07 DIAGNOSIS — Z5309 Procedure and treatment not carried out because of other contraindication: Secondary | ICD-10-CM | POA: Insufficient documentation

## 2012-10-07 DIAGNOSIS — R1310 Dysphagia, unspecified: Secondary | ICD-10-CM

## 2012-10-07 DIAGNOSIS — R079 Chest pain, unspecified: Secondary | ICD-10-CM

## 2012-10-07 DIAGNOSIS — Z8709 Personal history of other diseases of the respiratory system: Secondary | ICD-10-CM | POA: Insufficient documentation

## 2012-10-07 DIAGNOSIS — F411 Generalized anxiety disorder: Secondary | ICD-10-CM | POA: Insufficient documentation

## 2012-10-07 DIAGNOSIS — I10 Essential (primary) hypertension: Secondary | ICD-10-CM | POA: Insufficient documentation

## 2012-10-07 DIAGNOSIS — Z79899 Other long term (current) drug therapy: Secondary | ICD-10-CM | POA: Insufficient documentation

## 2012-10-07 DIAGNOSIS — R0789 Other chest pain: Secondary | ICD-10-CM | POA: Insufficient documentation

## 2012-10-07 DIAGNOSIS — J4489 Other specified chronic obstructive pulmonary disease: Secondary | ICD-10-CM | POA: Insufficient documentation

## 2012-10-07 DIAGNOSIS — K219 Gastro-esophageal reflux disease without esophagitis: Secondary | ICD-10-CM | POA: Insufficient documentation

## 2012-10-07 DIAGNOSIS — Z9889 Other specified postprocedural states: Secondary | ICD-10-CM | POA: Insufficient documentation

## 2012-10-07 DIAGNOSIS — I209 Angina pectoris, unspecified: Secondary | ICD-10-CM | POA: Insufficient documentation

## 2012-10-07 DIAGNOSIS — E7800 Pure hypercholesterolemia, unspecified: Secondary | ICD-10-CM | POA: Insufficient documentation

## 2012-10-07 DIAGNOSIS — J449 Chronic obstructive pulmonary disease, unspecified: Secondary | ICD-10-CM | POA: Insufficient documentation

## 2012-10-07 DIAGNOSIS — Z7982 Long term (current) use of aspirin: Secondary | ICD-10-CM | POA: Insufficient documentation

## 2012-10-07 LAB — COMPREHENSIVE METABOLIC PANEL
ALT: 22 U/L (ref 0–53)
AST: 22 U/L (ref 0–37)
Albumin: 3.8 g/dL (ref 3.5–5.2)
Alkaline Phosphatase: 68 U/L (ref 39–117)
BUN: 12 mg/dL (ref 6–23)
CO2: 26 mEq/L (ref 19–32)
Calcium: 9.1 mg/dL (ref 8.4–10.5)
Chloride: 105 meq/L (ref 96–112)
Creatinine, Ser: 1 mg/dL (ref 0.50–1.35)
GFR calc Af Amer: >90 mL/min (ref >90)
GFR calc non Af Amer: 80 mL/min — ABNORMAL LOW (ref >90)
Glucose, Bld: 102 mg/dL — ABNORMAL HIGH (ref 70–99)
Potassium: 4.4 meq/L (ref 3.5–5.1)
Sodium: 138 mEq/L (ref 135–145)
Total Bilirubin: 0.3 mg/dL (ref 0.3–1.2)
Total Protein: 6.3 g/dL (ref 6.0–8.3)

## 2012-10-07 LAB — CBC WITH DIFFERENTIAL/PLATELET
Basophils Absolute: 0 10*3/uL (ref 0.0–0.1)
Basophils Relative: 1 % (ref 0–1)
Eosinophils Absolute: 0.1 10*3/uL (ref 0.0–0.7)
Eosinophils Relative: 1 % (ref 0–5)
HCT: 43.3 % (ref 39.0–52.0)
Hemoglobin: 14.9 g/dL (ref 13.0–17.0)
Lymphocytes Relative: 28 % (ref 12–46)
Lymphs Abs: 2.1 10*3/uL (ref 0.7–4.0)
MCH: 32 pg (ref 26.0–34.0)
MCHC: 34.4 g/dL (ref 30.0–36.0)
MCV: 93.1 fL (ref 78.0–100.0)
Monocytes Absolute: 0.6 10*3/uL (ref 0.1–1.0)
Monocytes Relative: 7 % (ref 3–12)
Neutro Abs: 4.9 10*3/uL (ref 1.7–7.7)
Neutrophils Relative %: 63 % (ref 43–77)
Platelets: 193 10*3/uL (ref 150–400)
RBC: 4.65 MIL/uL (ref 4.22–5.81)
RDW: 13 % (ref 11.5–15.5)
WBC: 7.7 10*3/uL (ref 4.0–10.5)

## 2012-10-07 LAB — TROPONIN I: Troponin I: <0.3 ng/mL (ref <0.30)

## 2012-10-07 SURGERY — CANCELLED PROCEDURE

## 2012-10-07 NOTE — ED Notes (Signed)
Pt states chest is still hurting. EDP aware. Pt discharged with wife at side.

## 2012-10-07 NOTE — ED Notes (Signed)
Patient sent from Day surgery due to patient c/o chest pain. Patient states this pain is chronic and occurs daily. Takes nitro SL daily for this with no relief. Patient states pain is sharp and worse with movement. No cardiologist per patient. Patient was a day surgery to have EGD by Dr Jena Gauss. Patient states shortness of breath, has COPD. Appears in no distress during triage. Respirations even and unlabored, 96% saturations on RA.

## 2012-10-07 NOTE — ED Notes (Signed)
Pt arrived to [facility] today for egd, reported to staff he was having cp, pt has chronic cp that is not relieved by ntg, pain is worse w/ movement. H/o copd.   Skin w/d, resp even and unlabored at arrival.  Wife at bedside, pt placed on all monitors and ekg obtained. Pt was unable to complete procedure today,

## 2012-10-07 NOTE — ED Provider Notes (Signed)
History     CSN: [id number]  Arrival date & time 10/07/12  1142   First MD Initiated Contact with Patient 10/07/12 1245      Chief Complaint  Patient presents with  . Chest Pain    (Consider location/radiation/quality/duration/timing/severity/associated sxs/prior treatment) HPI Comments: Patient sent to the ED from [facility] Day [facility] for eval.  He was there for an EGD and possible stretching of the esophagus when he developed tightness in his chest.  He tells me this has been going on intermittently for the past 3-4 years.  He has seen his pcp and cardiology and tells me he had a heart cath performed with no intervention.  He denies exertional component.  Patient is a 61 y.o. male presenting with chest pain. The history is provided by the patient.  Chest Pain Episode onset: 3-4 years ago. Chest pain occurs intermittently. The chest pain is worsening. The severity of the pain is moderate. The quality of the pain is described as tightness. The pain does not radiate. Pertinent negatives for primary symptoms include no fever, no cough and no nausea. He tried nitroglycerin for the symptoms.     Past Medical History  Diagnosis Date  . Hypertension   . Hypercholesteremia   . Bronchitis, chronic   . Anxiety   . Acid reflux   . Acute angina   . COPD (chronic obstructive pulmonary disease)     Past Surgical History  Procedure Date  . Cervical spine surgery   . Ankle surgery bilateral  . Appendectomy   . Tonsillectomy   . Esophagogastroduodenoscopy 09/27/2004    RMR:   A couple of tiny, distal esophageal erosions, otherwise normal esophagus aside from a Schatzki ring status post Maloney dilation as described above/ Small hiatal hernia, otherwise normal stomach, normal D1-D2  . Colonoscopy 10/19/2004    RMR:   Friable anal canal, likely source of patient's hematochezia/  Otherwise normal rectum, normal colon.    Family History  Problem Relation Age of Onset  . Heart attack Mother      deceased, age 20s  . Ulcers Brother   . Diabetes Brother   . Colon cancer Neg Hx     History  Substance Use Topics  . Smoking status: Current Every Day Smoker -- 2.0 packs/day for 50 years    Types: Cigarettes  . Smokeless tobacco: Not on file  . Alcohol Use: 1.2 oz/week    2 Cans of beer per week     Comment: occasionally, usually holidays but sometimes on weekends, prior heavy use      Review of Systems  Constitutional: Negative for fever.  Respiratory: Negative for cough.   Cardiovascular: Positive for chest pain.  Gastrointestinal: Negative for nausea.  All other systems reviewed and are negative.    Allergies  Tomato  Home Medications   Current Outpatient Rx  Name  Route  Sig  Dispense  Refill  . ACETAMINOPHEN ER 650 MG PO TBCR   Oral   Take 650 mg by mouth daily as needed. For headache pain          . ALBUTEROL SULFATE HFA 108 (90 BASE) MCG/ACT IN AERS   Inhalation   Inhale 2 puffs into the lungs every 6 (six) hours as needed. Shortness of Breath         . ASPIRIN 325 MG PO TABS   Oral   Take 325 mg by mouth daily.         . ATENOLOL 50 MG PO  TABS   Oral   Take 50 mg by mouth every morning.          . ATORVASTATIN CALCIUM 20 MG PO TABS   Oral   Take 20 mg by mouth every morning.          . DEXLANSOPRAZOLE 60 MG PO CPDR   Oral   Take 60 mg by mouth every morning.          . CORICIDIN HBP CONGESTION/COUGH PO   Oral   Take 2 tablets by mouth every 4 (four) hours as needed. For bronchitis          . LORAZEPAM 1 MG PO TABS   Oral   Take 1 mg by mouth every 4 (four) hours as needed. For nerves         . NITROGLYCERIN 0.4 MG SL SUBL   Sublingual   Place 0.4 mg under the tongue every 5 (five) minutes as needed. Chest Pain         . POLYETHYLENE GLYCOL 3350 PO POWD   Oral   Take 17 g by mouth 2 (two) times daily.         Marland Kitchen TIOTROPIUM BROMIDE MONOHYDRATE 18 MCG IN CAPS   Inhalation   Place 18 mcg into inhaler and inhale  every morning.           BP 135/81  Pulse 70  Temp 98.6 F (37 C) (Oral)  Resp 21  Wt 215 lb (97.523 kg)  SpO2 96%  Physical Exam  Nursing note and vitals reviewed. Constitutional: He is oriented to person, place, and time. He appears well-developed and well-nourished. No distress.  HENT:  Head: Normocephalic and atraumatic.  Mouth/Throat: Oropharynx is clear and moist.  Neck: Normal range of motion. Neck supple.  Cardiovascular: Normal rate and regular rhythm.   No murmur heard. Pulmonary/Chest: Effort normal and breath sounds normal. No respiratory distress. He has no wheezes.  Abdominal: Soft. Bowel sounds are normal. He exhibits no distension. There is no tenderness.  Musculoskeletal: Normal range of motion. He exhibits no edema.  Neurological: He is alert and oriented to person, place, and time.  Skin: Skin is warm and dry. He is not diaphoretic.    ED Course  Procedures (including critical care time)   Labs Reviewed  CBC WITH DIFFERENTIAL  COMPREHENSIVE METABOLIC PANEL  TROPONIN I   No results found.   No diagnosis found.   Date: 10/07/2012  Rate: 68  Rhythm: normal sinus rhythm  QRS Axis: normal  Intervals: normal  ST/T Wave abnormalities: normal  Conduction Disutrbances:none  Narrative Interpretation:   Old EKG Reviewed: unchanged    MDM  This patient was sent from Same Day for eval of chest pain.  He tells me this has been going on for several years.  Today's workup reveals a normal ekg and negative troponin and I have a low suspicion that this is cardiac in nature.  I have spoken with Dr. Renard Matter who is the patient's pcp.  He would like to follow him up in the office in the next few days.          Geoffery Lyons, MD 10/07/12 1430

## 2012-10-07 NOTE — Telephone Encounter (Signed)
Patient was sent directly to ER when he arrived in Endo to have his EGD poss ED he was c/o severe chest pain and tightness so procedure was not preformed

## 2012-10-07 NOTE — Progress Notes (Signed)
Pt. In for EGD/ ED. States that he has been having chest pain since 0700. Took Nitro SL x 1 without relief.  Pt. States "it feels like an elephant on my chest". Dr. Jena Gauss notified.  Pt. Taken to ED for evaluation.

## 2012-10-08 NOTE — Telephone Encounter (Signed)
Reviewed notes from ED. EKG, cardiac enzymes unremarkable. Supposed to follow up with Dr. Renard Matter in few days.   Let's get him rescheduled once he is cleared by PCP.

## 2012-10-10 ENCOUNTER — Encounter: Payer: Self-pay | Admitting: Cardiology

## 2012-10-10 ENCOUNTER — Encounter: Payer: Self-pay | Admitting: *Deleted

## 2012-10-10 ENCOUNTER — Ambulatory Visit (INDEPENDENT_AMBULATORY_CARE_PROVIDER_SITE_OTHER): Payer: Medicare Other | Admitting: Cardiology

## 2012-10-10 VITALS — BP 102/68 | HR 74 | Ht 66.0 in | Wt 214.0 lb

## 2012-10-10 DIAGNOSIS — E782 Mixed hyperlipidemia: Secondary | ICD-10-CM

## 2012-10-10 DIAGNOSIS — R5381 Other malaise: Secondary | ICD-10-CM

## 2012-10-10 DIAGNOSIS — I10 Essential (primary) hypertension: Secondary | ICD-10-CM

## 2012-10-10 DIAGNOSIS — R5383 Other fatigue: Secondary | ICD-10-CM

## 2012-10-10 DIAGNOSIS — R079 Chest pain, unspecified: Secondary | ICD-10-CM | POA: Insufficient documentation

## 2012-10-10 DIAGNOSIS — F17210 Nicotine dependence, cigarettes, uncomplicated: Secondary | ICD-10-CM | POA: Insufficient documentation

## 2012-10-10 DIAGNOSIS — Z720 Tobacco use: Secondary | ICD-10-CM | POA: Insufficient documentation

## 2012-10-10 NOTE — Patient Instructions (Addendum)
Your physician recommends that you schedule a follow-up appointment in: 3 weeks  Your physician has requested that you have a stress echocardiogram. For further information please visit [email or web address] Please follow instruction sheet as given.  Your physician recommends that you return for lab work in: This week

## 2012-10-10 NOTE — Assessment & Plan Note (Signed)
Blood pressure control has been good with rare exceptions, with current medications, which will be continued.

## 2012-10-10 NOTE — Progress Notes (Deleted)
Name: Trevor Berg    DOB: 03-Jan-1951  Age: 61 y.o.  MR#: [id number]       PCP:  Alice Reichert, MD      Insurance: @PAYORNAME @   CC:   No chief complaint on file.  MEDICATION LIST PRE OP CLEARANCE FOR EGD - DR ROURK (DYSPHAGIA AND EPIGASTRIC PAIN)  VS BP 102/68  Pulse 74  Ht 5\' 6"  (1.676 m)  Wt 214 lb (97.07 kg)  BMI 34.54 kg/m2  SpO2 96%  Weights Current Weight  10/10/12 214 lb (97.07 kg)  10/07/12 215 lb (97.523 kg)  09/23/12 215 lb 3.2 oz (97.614 kg)    Blood Pressure  BP Readings from Last 3 Encounters:  10/10/12 102/68  10/07/12 102/76  09/23/12 131/78     Admit date:  (Not on file) Last encounter with RMR:  Visit date not found   Allergy Allergies  Allergen Reactions  . Tomato Rash    Current Outpatient Prescriptions  Medication Sig Dispense Refill  . acetaminophen (TYLENOL) 650 MG CR tablet Take 650 mg by mouth daily as needed. For headache pain       . albuterol (PROVENTIL HFA;VENTOLIN HFA) 108 (90 BASE) MCG/ACT inhaler Inhale 2 puffs into the lungs every 6 (six) hours as needed. Shortness of Breath      . aspirin 325 MG tablet Take 325 mg by mouth daily.      Marland Kitchen atenolol (TENORMIN) 50 MG tablet Take 50 mg by mouth every morning.       Marland Kitchen atorvastatin (LIPITOR) 20 MG tablet Take 20 mg by mouth every morning.       Marland Kitchen dexlansoprazole (DEXILANT) 60 MG capsule Take 60 mg by mouth every morning.       Marland Kitchen Dextromethorphan-Guaifenesin (CORICIDIN HBP CONGESTION/COUGH PO) Take 2 tablets by mouth every 4 (four) hours as needed. For bronchitis       . LORazepam (ATIVAN) 1 MG tablet Take 1 mg by mouth every 4 (four) hours as needed. For nerves      . nitroGLYCERIN (NITROSTAT) 0.4 MG SL tablet Place 0.4 mg under the tongue every 5 (five) minutes as needed. Chest Pain      . polyethylene glycol powder (GLYCOLAX/MIRALAX) powder Take 17 g by mouth 2 (two) times daily.      Marland Kitchen tiotropium (SPIRIVA) 18 MCG inhalation capsule Place 18 mcg into inhaler and inhale every morning.         Discontinued Meds:   There are no discontinued medications.  Patient Active Problem List  Diagnosis  . Hypertension  . Hyperlipidemia  . Gastroesophageal reflux disease  . COPD (chronic obstructive pulmonary disease)  . Chest pain  . Tobacco abuse    LABS Admission on 10/07/2012, Discharged on 10/07/2012  Component Date Value  . WBC 10/07/2012 7.7   . RBC 10/07/2012 4.65   . Hemoglobin 10/07/2012 14.9   . HCT 10/07/2012 43.3   . MCV 10/07/2012 93.1   . [facility] 10/07/2012 32.0   . MCHC 10/07/2012 34.4   . RDW 10/07/2012 13.0   . Platelets 10/07/2012 193   . Neutrophils Relative 10/07/2012 63   . Neutro Abs 10/07/2012 4.9   . Lymphocytes Relative 10/07/2012 28   . Lymphs Abs 10/07/2012 2.1   . Monocytes Relative 10/07/2012 7   . Monocytes Absolute 10/07/2012 0.6   . Eosinophils Relative 10/07/2012 1   . Eosinophils Absolute 10/07/2012 0.1   . Basophils Relative 10/07/2012 1   . Basophils Absolute 10/07/2012 0.0   .  Sodium 10/07/2012 138   . Potassium 10/07/2012 4.4   . Chloride 10/07/2012 105   . CO2 10/07/2012 26   . Glucose, Bld 10/07/2012 102*  . BUN 10/07/2012 12   . Creatinine, Ser 10/07/2012 1.00   . Calcium 10/07/2012 9.1   . Total Protein 10/07/2012 6.3   . Albumin 10/07/2012 3.8   . AST 10/07/2012 22   . ALT 10/07/2012 22   . Alkaline Phosphatase 10/07/2012 68   . Total Bilirubin 10/07/2012 0.3   . GFR calc non Af Amer 10/07/2012 80*  . GFR calc Af Amer 10/07/2012 >90   . Troponin I 10/07/2012 <0.30      Results for this Opt Visit:     Results for orders placed during the hospital encounter of 10/07/12  CBC WITH DIFFERENTIAL      Component Value Range   WBC 7.7  4.0 - 10.5 K/uL   RBC 4.65  4.22 - 5.81 MIL/uL   Hemoglobin 14.9  13.0 - 17.0 g/dL   HCT 45.4  09.8 - 11.9 %   MCV 93.1  78.0 - 100.0 fL   MCH 32.0  26.0 - 34.0 pg   MCHC 34.4  30.0 - 36.0 g/dL   RDW 14.7  82.9 - 56.2 %   Platelets 193  150 - 400 K/uL   Neutrophils Relative 63  43 -  77 %   Neutro Abs 4.9  1.7 - 7.7 K/uL   Lymphocytes Relative 28  12 - 46 %   Lymphs Abs 2.1  0.7 - 4.0 K/uL   Monocytes Relative 7  3 - 12 %   Monocytes Absolute 0.6  0.1 - 1.0 K/uL   Eosinophils Relative 1  0 - 5 %   Eosinophils Absolute 0.1  0.0 - 0.7 K/uL   Basophils Relative 1  0 - 1 %   Basophils Absolute 0.0  0.0 - 0.1 K/uL  COMPREHENSIVE METABOLIC PANEL      Component Value Range   Sodium 138  135 - 145 mEq/L   Potassium 4.4  3.5 - 5.1 mEq/L   Chloride 105  96 - 112 mEq/L   CO2 26  19 - 32 mEq/L   Glucose, Bld 102 (*) 70 - 99 mg/dL   BUN 12  6 - 23 mg/dL   Creatinine, Ser 1.30  0.50 - 1.35 mg/dL   Calcium 9.1  8.4 - 86.5 mg/dL   Total Protein 6.3  6.0 - 8.3 g/dL   Albumin 3.8  3.5 - 5.2 g/dL   AST 22  0 - 37 U/L   ALT 22  0 - 53 U/L   Alkaline Phosphatase 68  39 - 117 U/L   Total Bilirubin 0.3  0.3 - 1.2 mg/dL   GFR calc non Af Amer 80 (*) >90 mL/min   GFR calc Af Amer >90  >90 mL/min  TROPONIN I      Component Value Range   Troponin I <0.30  <0.30 ng/mL    EKG Orders placed during the hospital encounter of 10/07/12  . ED EKG  . ED EKG  . EKG     Prior Assessment and Plan Problem List as of 10/10/2012            Cardiology Problems   Hypertension   Hyperlipidemia     Other   Gastroesophageal reflux disease   COPD (chronic obstructive pulmonary disease)   Chest pain   Tobacco abuse       Imaging: Dg Chest  2 View  10/07/2012  *RADIOLOGY REPORT*  Clinical Data: Dysphagia  CHEST - 2 VIEW  Comparison: 06/06/2012  Findings: Normal heart size.  Lungs are clear.  No pleural effusion and no pneumothorax.  Degenerative changes in the thoracic spine. Plate and screws in the lower cervical spine.  IMPRESSION: No active cardiopulmonary disease.   Original Report Authenticated By: Jolaine Click, M.D.    Varney Biles Kayleen Memos W/high Density W/kub  09/12/2012  *RADIOLOGY REPORT*  Clinical Data: Difficulty swallowing liquids, solids, and pills for 3 months, feeling like things  stick in throat, gets choked  UPPER GI SERIES WITH AIR/HIGH DENSITY BARIUM AND KUB:  Technique: Routine air contrast upper GI series performed following ingestion of effervescent granules and high density barium.  Scout image was performed.  Fluoroscopy time:  4.0 minutes  Comparison:  Abdominal radiographs 06/16/2011  Findings: Normal bowel gas pattern on scout image. Normal esophageal distention without esophageal mass or stricture. 12.5 mm diameter barium tablet passes oral cavity to stomach without delay. Mild age-related impairment esophageal motility. Targeted rapid sequence imaging of the cervical esophagus hypopharynx reveals laryngeal penetration without aspiration. Patient had an unwitnessed episode of coughing following swallowing of water. Vallecular residuals were noted. Prior cervical spine fusion noted. No esophageal filling defects identified. Smooth appearance of esophageal mucosa on air-contrast imaging without irregularity or ulceration.  Stomach distends normally without gross mass or ulceration. Rugal folds grossly normal appearance. No gastric outlet obstruction. Small duodenal diverticulum noted. Duodenal bulb and sweep otherwise normal appearance. Ligament Treitz normal position. Visualized jejunal loops unremarkable.  IMPRESSION: Laryngeal penetration without aspiration. Vallecular residuals. No evidence of esophageal obstruction or mass. Remainder of exam unremarkable.   Original Report Authenticated By: Ulyses Southward, M.D.      [facility] Calculation: Score not calculated. Missing: Total Cholesterol, HDL

## 2012-10-10 NOTE — Progress Notes (Signed)
Patient ID: Trevor Berg, male   DOB: 05-Jan-1951, 61 y.o.   MRN: [id number]  HPI: Scheduled return visit for this very nice gentleman kindly referred by Dr. Jena Gauss for evaluation of chest discomfort.  Trevor Berg has had chest pain for years, perhaps since cardiac catheterization performed in 2005.  Insignificant disease was present with the most striking lesion being a 40% LAD stenosis.  He typically experiences moderately severe lower substernal chest discomfort with anxiety, but not with exertion, that persists for up to one half hour..  Paradoxically, he also reports that he is awakened from sleep with those symptoms.  There is an associated feeling of not being able to take a deep breath, but not necessarily dyspnea.  He does have class III dyspnea on exertion and easy fatigability.  He experiences leg pain and leg weakness, not necessarily related to exercise.  He has suffered some falls.  Cigarette consumption is down to 1/10 pack per day  Prior to Admission medications   Medication Sig Start Date End Date Taking? Authorizing Provider  acetaminophen (TYLENOL) 650 MG CR tablet Take 650 mg by mouth daily as needed. For headache pain    Yes Historical Provider, MD  albuterol (PROVENTIL HFA;VENTOLIN HFA) 108 (90 BASE) MCG/ACT inhaler Inhale 2 puffs into the lungs every 6 (six) hours as needed. Shortness of Breath   Yes Historical Provider, MD  aspirin 325 MG tablet Take 325 mg by mouth daily.   Yes Historical Provider, MD  atenolol (TENORMIN) 50 MG tablet Take 50 mg by mouth every morning.    Yes Historical Provider, MD  atorvastatin (LIPITOR) 20 MG tablet Take 20 mg by mouth every morning.    Yes Historical Provider, MD  dexlansoprazole (DEXILANT) 60 MG capsule Take 60 mg by mouth every morning.    Yes Historical Provider, MD  Dextromethorphan-Guaifenesin (CORICIDIN HBP CONGESTION/COUGH PO) Take 2 tablets by mouth every 4 (four) hours as needed. For bronchitis    Yes Historical Provider, MD  LORazepam  (ATIVAN) 1 MG tablet Take 1 mg by mouth every 4 (four) hours as needed. For nerves   Yes Historical Provider, MD  nitroGLYCERIN (NITROSTAT) 0.4 MG SL tablet Place 0.4 mg under the tongue every 5 (five) minutes as needed. Chest Pain   Yes Historical Provider, MD  polyethylene glycol powder (GLYCOLAX/MIRALAX) powder Take 17 g by mouth 2 (two) times daily. 09/23/12  Yes Tiffany Kocher, PA  tiotropium (SPIRIVA) 18 MCG inhalation capsule Place 18 mcg into inhaler and inhale every morning.   Yes Historical Provider, MD   Allergies  Allergen Reactions  . Tomato Rash  Past medical history, social history, and family history reviewed and updated.  ROS: Notes generalized malaise and fatigue; occasional pedal edema; intermittent cough and wheezing with dyspnea on exertion; heartburn and dysphagia;back and joint pain; frequent headaches; insomnia; heat intolerance.  Denies orthopnea, PND, lightheadedness or syncope.  Anxiety typically occurs in social situations.  All other systems reviewed and are negative.  PHYSICAL EXAM: BP 102/68  Pulse 74  Ht 5\' 6"  (1.676 m)  Wt 97.07 kg (214 lb)  BMI 34.54 kg/m2  SpO2 96%  General-Well developed; no acute distress Body habitus-proportionate weight and height Neck-No JVD; no carotid bruits Lungs-clear lung fields; resonant to percussion Cardiovascular-normal PMI; normal S1 and S2 Abdomen-normal bowel sounds; soft and non-tender without masses or organomegaly Musculoskeletal-No deformities, no cyanosis or clubbing Neurologic-Normal cranial nerves; symmetric strength and tone Skin-Warm, no significant lesions Extremities-1-2+distal pulses; Trace edema; surgical scar over the  right ankle  EKG: Tracing from 10/07/12 obtained and reviewed-normal sinus rhythm, normal.  ASSESSMENT AND PLAN:  [place] Bing, MD 10/10/2012 2:28 PM

## 2012-10-11 ENCOUNTER — Encounter: Payer: Self-pay | Admitting: Cardiology

## 2012-10-16 ENCOUNTER — Encounter: Payer: Self-pay | Admitting: Cardiology

## 2012-10-17 ENCOUNTER — Ambulatory Visit (HOSPITAL_COMMUNITY)
Admission: RE | Admit: 2012-10-17 | Discharge: 2012-10-17 | Disposition: A | Discharge status: Home / Self Care | Payer: Medicare Other | Source: Ambulatory Visit | Attending: Cardiology | Admitting: Cardiology

## 2012-10-17 ENCOUNTER — Encounter (HOSPITAL_COMMUNITY): Payer: Self-pay | Admitting: Cardiology

## 2012-10-17 DIAGNOSIS — R072 Precordial pain: Secondary | ICD-10-CM

## 2012-10-17 DIAGNOSIS — J4489 Other specified chronic obstructive pulmonary disease: Secondary | ICD-10-CM | POA: Insufficient documentation

## 2012-10-17 DIAGNOSIS — I10 Essential (primary) hypertension: Secondary | ICD-10-CM | POA: Insufficient documentation

## 2012-10-17 DIAGNOSIS — R079 Chest pain, unspecified: Secondary | ICD-10-CM

## 2012-10-17 DIAGNOSIS — J449 Chronic obstructive pulmonary disease, unspecified: Secondary | ICD-10-CM | POA: Insufficient documentation

## 2012-10-17 DIAGNOSIS — R5383 Other fatigue: Secondary | ICD-10-CM

## 2012-10-17 NOTE — Progress Notes (Signed)
*  PRELIMINARY RESULTS* Echocardiogram Echocardiogram Stress Test has been performed.  Conrad Bee 10/17/2012, 10:28 AM

## 2012-10-17 NOTE — Progress Notes (Signed)
Stress Lab Nurses Notes - [facility]  Trevor Berg 10/17/2012 Reason for doing test: Chest Pain and fatigue Type of test: Stress Echo Nurse performing test: Parke Poisson, RN Nuclear Medicine Tech: Not Applicable Echo Tech: Karrie Doffing MD performing test: Ival Bible & Joni Reining NP Family MD: [facility] Test explained and consent signed: yes IV started: No IV started Symptoms:  SOB & Fatigue Treatment/Intervention: None Reason test stopped: fatigue After recovery IV was: NA Patient to return to Nuc. Med at :NA Patient discharged: Home Patient's Condition upon discharge was: stable Comments: During test peak BP 182/ & HR 142.  Recovery BP 156/89 & HR 99.  Continues to have chest pain, remains the same. [name]

## 2012-10-28 ENCOUNTER — Encounter: Payer: Self-pay | Admitting: *Deleted

## 2012-10-30 DIAGNOSIS — A048 Other specified bacterial intestinal infections: Secondary | ICD-10-CM

## 2012-10-30 HISTORY — DX: Other specified bacterial intestinal infections: A04.8

## 2012-11-07 ENCOUNTER — Encounter: Payer: Self-pay | Admitting: *Deleted

## 2012-11-07 ENCOUNTER — Ambulatory Visit (INDEPENDENT_AMBULATORY_CARE_PROVIDER_SITE_OTHER): Payer: Medicare Other | Admitting: Cardiology

## 2012-11-07 ENCOUNTER — Encounter: Payer: Self-pay | Admitting: Cardiology

## 2012-11-07 VITALS — BP 137/90 | HR 83 | Ht 66.0 in | Wt 215.8 lb

## 2012-11-07 DIAGNOSIS — Z720 Tobacco use: Secondary | ICD-10-CM

## 2012-11-07 DIAGNOSIS — J4489 Other specified chronic obstructive pulmonary disease: Secondary | ICD-10-CM

## 2012-11-07 DIAGNOSIS — F17200 Nicotine dependence, unspecified, uncomplicated: Secondary | ICD-10-CM

## 2012-11-07 DIAGNOSIS — I10 Essential (primary) hypertension: Secondary | ICD-10-CM

## 2012-11-07 DIAGNOSIS — R079 Chest pain, unspecified: Secondary | ICD-10-CM

## 2012-11-07 DIAGNOSIS — K219 Gastro-esophageal reflux disease without esophagitis: Secondary | ICD-10-CM

## 2012-11-07 DIAGNOSIS — E782 Mixed hyperlipidemia: Secondary | ICD-10-CM

## 2012-11-07 DIAGNOSIS — J449 Chronic obstructive pulmonary disease, unspecified: Secondary | ICD-10-CM

## 2012-11-07 DIAGNOSIS — E785 Hyperlipidemia, unspecified: Secondary | ICD-10-CM

## 2012-11-07 MED ORDER — ATORVASTATIN CALCIUM 40 MG PO TABS: 40.0000 mg | ORAL_TABLET | ORAL | Status: DC

## 2012-11-07 MED ORDER — AMLODIPINE BESYLATE 2.5 MG PO TABS: 2.5000 mg | ORAL_TABLET | Freq: Every day | ORAL | Status: DC

## 2012-11-07 NOTE — Assessment & Plan Note (Signed)
Recent lipid profile was excellent.  Patient was already treated with a low-dose statin.  We will advance to moderate dose therapy.

## 2012-11-07 NOTE — Progress Notes (Signed)
Patient ID: Trevor Berg, male   DOB: Mar 09, 1951, 62 y.o.   MRN: [id number]  HPI: Scheduled return visit for this nice but quiet gentleman with multiple cardiovascular risk factors but no known vascular disease and atypical chest pain.  A stress echocardiogram revealed normal LV systolic function, impaired exercise capacity and no EKG or echocardiographic evidence for myocardial ischemia.  Trevor Berg has had additional episodes of chest discomfort for which he uses sublingual nitroglycerin a few times per week.  Blood pressure control has been adequate when assessed; weight has been stable.  Although he reported smoking only a few cigarettes per day at his last office visit, with his wife now present, he admits to one pack per day.  He briefly was treated with Chantix a few months ago, but experienced adverse effects necessitating discontinuation.  He has stopped smoking for a considerable period of time in the past without any pharmacologic assistance; however, the past few months represent a trial of this strategy, which has not been successful.  Prior to Admission medications   Medication Sig Start Date End Date Taking? Authorizing Provider  acetaminophen (TYLENOL) 650 MG CR tablet Take 650 mg by mouth daily as needed. For headache pain    Yes Historical Provider, MD  albuterol (PROVENTIL HFA;VENTOLIN HFA) 108 (90 BASE) MCG/ACT inhaler Inhale 2 puffs into the lungs every 6 (six) hours as needed. Shortness of Breath   Yes Historical Provider, MD  aspirin 325 MG tablet Take 325 mg by mouth daily.   Yes Historical Provider, MD  atenolol (TENORMIN) 50 MG tablet Take 50 mg by mouth every morning.    Yes Historical Provider, MD  atorvastatin (LIPITOR) 40 MG tablet Take 1 tablet (40 mg total) by mouth every morning. 11/07/12  Yes Kathlen Brunswick, MD  dexlansoprazole (DEXILANT) 60 MG capsule Take 60 mg by mouth every morning.    Yes Historical Provider, MD  Dextromethorphan-Guaifenesin (CORICIDIN HBP  CONGESTION/COUGH PO) Take 2 tablets by mouth every 4 (four) hours as needed. For bronchitis    Yes Historical Provider, MD  LORazepam (ATIVAN) 1 MG tablet Take 1 mg by mouth every 4 (four) hours as needed. For nerves   Yes Historical Provider, MD  nitroGLYCERIN (NITROSTAT) 0.4 MG SL tablet Place 0.4 mg under the tongue every 5 (five) minutes as needed. Chest Pain   Yes Historical Provider, MD  polyethylene glycol powder (GLYCOLAX/MIRALAX) powder Take 17 g by mouth 2 (two) times daily. 09/23/12  Yes Tiffany Kocher, PA  tiotropium (SPIRIVA) 18 MCG inhalation capsule Place 18 mcg into inhaler and inhale every morning.   Yes Historical Provider, MD  amLODipine (NORVASC) 2.5 MG tablet Take 1 tablet (2.5 mg total) by mouth daily. 11/07/12   Kathlen Brunswick, MD   Allergies  Allergen Reactions  . Tomato Rash  Past medical history, social history, and family history reviewed and updated.  ROS: Denies orthopnea, PND, pedal edema, palpitations, lightheadedness or syncope.  All other systems reviewed and are negative.  PHYSICAL EXAM: BP 137/90  Pulse 83  Ht 5\' 6"  (1.676 m)  Wt 97.864 kg (215 lb 12 oz)  BMI 34.82 kg/m2  SpO2 95%  General-Well developed; no acute distress Body habitus-proportionate weight and height Neck-No JVD; no carotid bruits Lungs-clear lung fields; resonant to percussion Cardiovascular-normal PMI; normal S1 and S2 Abdomen-normal bowel sounds; soft and non-tender without masses or organomegaly Musculoskeletal-No deformities, no cyanosis or clubbing Neurologic-Normal cranial nerves; symmetric strength and tone Skin-Warm, no significant lesions Extremities-distal pulses intact;  no edema  ASSESSMENT AND PLAN:  [place] Bing, MD 11/07/2012 3:19 PM

## 2012-11-07 NOTE — Assessment & Plan Note (Signed)
Blood pressure control appears fairly good, but he may still benefit from an additional antihypertensive agent.  Treatment with Amlodipine initiated both for its antihypertensive and anti-ischemic effects.

## 2012-11-07 NOTE — Assessment & Plan Note (Addendum)
Atypical discomfort persists.  There is no evidence for significant myocardial ischemia, but a small area not demonstrated by stress echocardiography could be the etiology of his symptoms.  Amlodipine will be added to his medical regime as a therapeutic trial for his chest discomfort and for better management of hypertension.  Patient has impaired exercise capacity, likely as a result of a sedentary lifestyle.  Increase exercise advised.

## 2012-11-07 NOTE — Progress Notes (Deleted)
Name: Trevor Berg    DOB: [birth year]  Age: 62 y.o.  MR#: [id number]       PCP:  Alice Reichert, MD      Insurance: @PAYORNAME @   CC:   No chief complaint on file.  Medication List Echo 10/17/12  VS BP 137/90  Pulse 83  Ht 5\' 6"  (1.676 m)  Wt 215 lb 12 oz (97.864 kg)  BMI 34.82 kg/m2  SpO2 95%  Weights Current Weight  11/07/12 215 lb 12 oz (97.864 kg)  10/10/12 214 lb (97.07 kg)  10/07/12 215 lb (97.523 kg)    Blood Pressure  BP Readings from Last 3 Encounters:  11/07/12 137/90  10/10/12 102/68  10/07/12 102/76     Admit date:  (Not on file) Last encounter with RMR:  10/16/2012   Allergy Allergies  Allergen Reactions  . Tomato Rash    Current Outpatient Prescriptions  Medication Sig Dispense Refill  . acetaminophen (TYLENOL) 650 MG CR tablet Take 650 mg by mouth daily as needed. For headache pain       . albuterol (PROVENTIL HFA;VENTOLIN HFA) 108 (90 BASE) MCG/ACT inhaler Inhale 2 puffs into the lungs every 6 (six) hours as needed. Shortness of Breath      . aspirin 325 MG tablet Take 325 mg by mouth daily.      Marland Kitchen atenolol (TENORMIN) 50 MG tablet Take 50 mg by mouth every morning.       Marland Kitchen atorvastatin (LIPITOR) 20 MG tablet Take 20 mg by mouth every morning.       Marland Kitchen dexlansoprazole (DEXILANT) 60 MG capsule Take 60 mg by mouth every morning.       Marland Kitchen Dextromethorphan-Guaifenesin (CORICIDIN HBP CONGESTION/COUGH PO) Take 2 tablets by mouth every 4 (four) hours as needed. For bronchitis       . LORazepam (ATIVAN) 1 MG tablet Take 1 mg by mouth every 4 (four) hours as needed. For nerves      . nitroGLYCERIN (NITROSTAT) 0.4 MG SL tablet Place 0.4 mg under the tongue every 5 (five) minutes as needed. Chest Pain      . polyethylene glycol powder (GLYCOLAX/MIRALAX) powder Take 17 g by mouth 2 (two) times daily.      Marland Kitchen tiotropium (SPIRIVA) 18 MCG inhalation capsule Place 18 mcg into inhaler and inhale every morning.        Discontinued Meds:   There are no discontinued  medications.  Patient Active Problem List  Diagnosis  . Hypertension  . Hyperlipidemia  . Gastroesophageal reflux disease  . COPD (chronic obstructive pulmonary disease)  . Chest pain  . Tobacco abuse    LABS Admission on 10/07/2012, Discharged on 10/07/2012  Component Date Value  . WBC 10/07/2012 7.7   . RBC 10/07/2012 4.65   . Hemoglobin 10/07/2012 14.9   . HCT 10/07/2012 43.3   . MCV 10/07/2012 93.1   . [facility] 10/07/2012 32.0   . MCHC 10/07/2012 34.4   . RDW 10/07/2012 13.0   . Platelets 10/07/2012 193   . Neutrophils Relative 10/07/2012 63   . Neutro Abs 10/07/2012 4.9   . Lymphocytes Relative 10/07/2012 28   . Lymphs Abs 10/07/2012 2.1   . Monocytes Relative 10/07/2012 7   . Monocytes Absolute 10/07/2012 0.6   . Eosinophils Relative 10/07/2012 1   . Eosinophils Absolute 10/07/2012 0.1   . Basophils Relative 10/07/2012 1   . Basophils Absolute 10/07/2012 0.0   . Sodium 10/07/2012 138   . Potassium 10/07/2012 4.4   .  Chloride 10/07/2012 105   . CO2 10/07/2012 26   . Glucose, Bld 10/07/2012 102*  . BUN 10/07/2012 12   . Creatinine, Ser 10/07/2012 1.00   . Calcium 10/07/2012 9.1   . Total Protein 10/07/2012 6.3   . Albumin 10/07/2012 3.8   . AST 10/07/2012 22   . ALT 10/07/2012 22   . Alkaline Phosphatase 10/07/2012 68   . Total Bilirubin 10/07/2012 0.3   . GFR calc non Af Amer 10/07/2012 80*  . GFR calc Af Amer 10/07/2012 >90   . Troponin I 10/07/2012 <0.30      Results for this Opt Visit:     Results for orders placed during the hospital encounter of 10/07/12  CBC WITH DIFFERENTIAL      Component Value Range   WBC 7.7  4.0 - 10.5 K/uL   RBC 4.65  4.22 - 5.81 MIL/uL   Hemoglobin 14.9  13.0 - 17.0 g/dL   HCT 16.1  09.6 - 04.5 %   MCV 93.1  78.0 - 100.0 fL   MCH 32.0  26.0 - 34.0 pg   MCHC 34.4  30.0 - 36.0 g/dL   RDW 40.9  81.1 - 91.4 %   Platelets 193  150 - 400 K/uL   Neutrophils Relative 63  43 - 77 %   Neutro Abs 4.9  1.7 - 7.7 K/uL   Lymphocytes  Relative 28  12 - 46 %   Lymphs Abs 2.1  0.7 - 4.0 K/uL   Monocytes Relative 7  3 - 12 %   Monocytes Absolute 0.6  0.1 - 1.0 K/uL   Eosinophils Relative 1  0 - 5 %   Eosinophils Absolute 0.1  0.0 - 0.7 K/uL   Basophils Relative 1  0 - 1 %   Basophils Absolute 0.0  0.0 - 0.1 K/uL  COMPREHENSIVE METABOLIC PANEL      Component Value Range   Sodium 138  135 - 145 mEq/L   Potassium 4.4  3.5 - 5.1 mEq/L   Chloride 105  96 - 112 mEq/L   CO2 26  19 - 32 mEq/L   Glucose, Bld 102 (*) 70 - 99 mg/dL   BUN 12  6 - 23 mg/dL   Creatinine, Ser 7.82  0.50 - 1.35 mg/dL   Calcium 9.1  8.4 - 95.6 mg/dL   Total Protein 6.3  6.0 - 8.3 g/dL   Albumin 3.8  3.5 - 5.2 g/dL   AST 22  0 - 37 U/L   ALT 22  0 - 53 U/L   Alkaline Phosphatase 68  39 - 117 U/L   Total Bilirubin 0.3  0.3 - 1.2 mg/dL   GFR calc non Af Amer 80 (*) >90 mL/min   GFR calc Af Amer >90  >90 mL/min  TROPONIN I      Component Value Range   Troponin I <0.30  <0.30 ng/mL    EKG Orders placed during the hospital encounter of 10/07/12  . ED EKG  . ED EKG  . EKG     Prior Assessment and Plan Problem List as of 11/07/2012            Cardiology Problems   Hypertension   Last Assessment & Plan Note   10/10/2012 Office Visit Signed 10/10/2012  2:39 PM by Kathlen Brunswick, MD    Blood pressure control has been good with rare exceptions, with current medications, which will be continued.    Hyperlipidemia  Other   Gastroesophageal reflux disease   COPD (chronic obstructive pulmonary disease)   Chest pain   Tobacco abuse       Imaging: No results found.   FRS Calculation: Score not calculated. Missing: Total Cholesterol, HDL

## 2012-11-07 NOTE — Assessment & Plan Note (Signed)
Quit attempt advised with the assistance of transdermal nicotine patches.  We will continue to monitor.

## 2012-11-07 NOTE — Assessment & Plan Note (Addendum)
Rx with PPI.  Dosage could be increased if symptoms continue and do not appear to be cardiac in origin.

## 2012-11-07 NOTE — Assessment & Plan Note (Addendum)
Discontinuation of tobacco use with the assistance of transdermal nicotine advised.  Teaching provided provided by the nursing staff to promote an optimal quit attempt.

## 2012-11-07 NOTE — Patient Instructions (Addendum)
Your physician recommends that you schedule a follow-up appointment in: 6 weeks  Your physician has recommended you make the following change in your medication:  1 - START Amlodipine 2.5 mg dialy 2 - INCREASE Lipitor to 40 mg daily  Your physician recommends that you return for lab work in: 1 month (you will receive a reminder letter)  May try OTC Nicotine patches in effort to stop smoking

## 2012-11-10 ENCOUNTER — Encounter: Payer: Self-pay | Admitting: Cardiology

## 2012-11-11 ENCOUNTER — Encounter: Payer: Self-pay | Admitting: Cardiology

## 2012-12-18 ENCOUNTER — Encounter: Payer: Self-pay | Admitting: *Deleted

## 2012-12-18 ENCOUNTER — Other Ambulatory Visit: Payer: Self-pay | Admitting: *Deleted

## 2012-12-18 DIAGNOSIS — E782 Mixed hyperlipidemia: Secondary | ICD-10-CM

## 2012-12-26 ENCOUNTER — Ambulatory Visit (HOSPITAL_COMMUNITY)
Admission: RE | Admit: 2012-12-26 | Discharge: 2012-12-26 | Disposition: A | Discharge status: Home / Self Care | Payer: Medicare Other | Source: Ambulatory Visit | Attending: Cardiology | Admitting: Cardiology

## 2012-12-26 ENCOUNTER — Encounter: Payer: Self-pay | Admitting: Cardiology

## 2012-12-26 ENCOUNTER — Ambulatory Visit (INDEPENDENT_AMBULATORY_CARE_PROVIDER_SITE_OTHER): Payer: Medicare Other | Admitting: Cardiology

## 2012-12-26 VITALS — BP 152/90 | HR 68 | Ht 66.0 in | Wt 211.0 lb

## 2012-12-26 DIAGNOSIS — R0609 Other forms of dyspnea: Secondary | ICD-10-CM | POA: Insufficient documentation

## 2012-12-26 DIAGNOSIS — J449 Chronic obstructive pulmonary disease, unspecified: Secondary | ICD-10-CM | POA: Insufficient documentation

## 2012-12-26 DIAGNOSIS — J4489 Other specified chronic obstructive pulmonary disease: Secondary | ICD-10-CM | POA: Insufficient documentation

## 2012-12-26 DIAGNOSIS — R0600 Dyspnea, unspecified: Secondary | ICD-10-CM

## 2012-12-26 DIAGNOSIS — R05 Cough: Secondary | ICD-10-CM | POA: Insufficient documentation

## 2012-12-26 DIAGNOSIS — R0989 Other specified symptoms and signs involving the circulatory and respiratory systems: Secondary | ICD-10-CM

## 2012-12-26 DIAGNOSIS — R0602 Shortness of breath: Secondary | ICD-10-CM | POA: Insufficient documentation

## 2012-12-26 DIAGNOSIS — I10 Essential (primary) hypertension: Secondary | ICD-10-CM

## 2012-12-26 DIAGNOSIS — F17200 Nicotine dependence, unspecified, uncomplicated: Secondary | ICD-10-CM | POA: Insufficient documentation

## 2012-12-26 DIAGNOSIS — R079 Chest pain, unspecified: Secondary | ICD-10-CM

## 2012-12-26 DIAGNOSIS — R059 Cough, unspecified: Secondary | ICD-10-CM | POA: Insufficient documentation

## 2012-12-26 DIAGNOSIS — E785 Hyperlipidemia, unspecified: Secondary | ICD-10-CM

## 2012-12-26 MED ORDER — AMLODIPINE BESYLATE 5 MG PO TABS: 5.0000 mg | ORAL_TABLET | Freq: Every day | ORAL | Status: DC

## 2012-12-26 NOTE — Assessment & Plan Note (Signed)
Recent lipid profile was excellent. Current therapy will be continued.

## 2012-12-26 NOTE — Assessment & Plan Note (Addendum)
Patient believes that he may have a URI/bronchitis. Chest x-ray has been requested so that it will be available for Dr. Renard Matter' review at an upcoming office visit.  Complete discontinuation of tobacco use is strongly recommended. Patient promises to continue to taper tobacco consumption.

## 2012-12-26 NOTE — Progress Notes (Signed)
Patient ID: Trevor Berg, male   DOB: Nov 09, 1950, 62 y.o.   MRN: [id number]  HPI: Schedule return visit for this nice gentleman with chronic chest pain, chronic lung disease and multiple cardiovascular risk factors. Since his last visit, symptoms are little changed. He experiences some mid substernal chest tightness unpredictably and unrelated to exertion. There no associated symptoms. Amlodipine has not provided apparent benefit, but he does note some relief with sublingual nitroglycerin and/or albuterol by MDI. He's recently had increased respiratory tract secretions, rhinorrhea, cough and variable dyspnea at rest and on exertion. He is planning to see Dr. Renard Matter for the symptoms.  He and his wife both report that cigarette smoking is decreased, but cannot specify consumption. He has tried Chantix, but developed disturbing dreams prompting him to discontinue the medication.  Current Outpatient Prescriptions  Medication Sig Dispense Refill  . acetaminophen (TYLENOL) 650 MG CR tablet Take 650 mg by mouth daily as needed. For headache pain       . albuterol (PROVENTIL HFA;VENTOLIN HFA) 108 (90 BASE) MCG/ACT inhaler Inhale 2 puffs into the lungs every 6 (six) hours as needed. Shortness of Breath      . amLODipine (NORVASC) 2.5 MG tablet Take 1 tablet (2.5 mg total) by mouth daily.  30 tablet  6  . aspirin 325 MG tablet Take 325 mg by mouth daily.      Marland Kitchen atenolol (TENORMIN) 50 MG tablet Take 50 mg by mouth every morning.       Marland Kitchen atorvastatin (LIPITOR) 40 MG tablet Take 1 tablet (40 mg total) by mouth every morning.  90 tablet  3  . dexlansoprazole (DEXILANT) 60 MG capsule Take 60 mg by mouth every morning.       Marland Kitchen Dextromethorphan-Guaifenesin (CORICIDIN HBP CONGESTION/COUGH PO) Take 2 tablets by mouth every 4 (four) hours as needed. For bronchitis       . LORazepam (ATIVAN) 1 MG tablet Take 1 mg by mouth every 4 (four) hours as needed. For nerves      . nitroGLYCERIN (NITROSTAT) 0.4 MG SL tablet Place  0.4 mg under the tongue every 5 (five) minutes as needed. Chest Pain      . polyethylene glycol powder (GLYCOLAX/MIRALAX) powder Take 17 g by mouth 2 (two) times daily.      Marland Kitchen tiotropium (SPIRIVA) 18 MCG inhalation capsule Place 18 mcg into inhaler and inhale every morning.       No current facility-administered medications for this visit.   Allergies  Allergen Reactions  . Tomato Rash     Past medical history, social history, and family history reviewed and updated.  ROS: Denies fever, orthopnea, PND, palpitations, syncope or falls. He occasionally experiences brief dizziness that is not orthostatic. He has noted no ankle edema. All other systems reviewed and are negative.  PHYSICAL EXAM: BP 152/90  Pulse 68  Ht 5\' 6"  (1.676 m)  Wt 95.709 kg (211 lb)  BMI 34.07 kg/m2  SpO2 95%;  Body mass index is 34.07 kg/(m^2). General-Well developed; no acute distress Body habitus-mildly overweight Neck-No JVD; no carotid bruits Lungs-decreased breath sounds; prolonged expiratory phase; few expiratory rhonchi; resonant to percussion Cardiovascular-normal PMI; normal S1 and S2 Abdomen-normal bowel sounds; soft and non-tender without masses or organomegaly Musculoskeletal-No deformities, no cyanosis or clubbing Neurologic-Normal cranial nerves; symmetric strength and tone Skin-Warm, no significant lesions Extremities-distal pulses intact; no edema  [name], MD 12/26/2012  2:24 PM  ASSESSMENT AND PLAN

## 2012-12-26 NOTE — Patient Instructions (Addendum)
Your physician recommends that you schedule a follow-up appointment in: 4 months  A chest x-ray takes a picture of the organs and structures inside the chest, including the heart, lungs, and blood vessels. This test can show several things, including, whether the heart is enlarges; whether fluid is building up in the lungs; and whether pacemaker / defibrillator leads are still in place.  Your physician has recommended you make the following change in your medication:  1 - INCREASE Amlodipine to 5 mg daily

## 2012-12-26 NOTE — Assessment & Plan Note (Addendum)
Chest discomfort of unclear etiology continues. I think it more likely that his symptoms are related to chronic lung disease than to coronary artery disease as suggested by a recent negative stress nuclear study. Consideration could be given to addition of a combination long-acting beta agonist/inhaled steroid MDI.  Since additional lowering of blood pressure is warranted, amlodipine dose will be increased to 5 mg per day.

## 2012-12-26 NOTE — Progress Notes (Deleted)
Name: Trevor Berg    DOB: 1951-09-20  Age: 62 y.o.  MR#: [id number]       PCP:  Alice Reichert, MD      Insurance: Payor: [employer] MEDICARE  Plan: AARP MEDICARE COMPLETE  Product Type: *No Product type*    CC:   No chief complaint on file.  MEDICATION LIST LAST LABS 12/13  VS Filed Vitals:   12/26/12 1313  BP: 152/90  Pulse: 68  Height: 5\' 6"  (1.676 m)  Weight: 211 lb (95.709 kg)  SpO2: 95%    Weights Current Weight  12/26/12 211 lb (95.709 kg)  11/07/12 215 lb 12 oz (97.864 kg)  10/10/12 214 lb (97.07 kg)    Blood Pressure  BP Readings from Last 3 Encounters:  12/26/12 152/90  11/07/12 137/90  10/10/12 102/68     Admit date:  (Not on file) Last encounter with RMR:  11/07/2012   Allergy Tomato  Current Outpatient Prescriptions  Medication Sig Dispense Refill  . acetaminophen (TYLENOL) 650 MG CR tablet Take 650 mg by mouth daily as needed. For headache pain       . albuterol (PROVENTIL HFA;VENTOLIN HFA) 108 (90 BASE) MCG/ACT inhaler Inhale 2 puffs into the lungs every 6 (six) hours as needed. Shortness of Breath      . amLODipine (NORVASC) 2.5 MG tablet Take 1 tablet (2.5 mg total) by mouth daily.  30 tablet  6  . aspirin 325 MG tablet Take 325 mg by mouth daily.      Marland Kitchen atenolol (TENORMIN) 50 MG tablet Take 50 mg by mouth every morning.       Marland Kitchen atorvastatin (LIPITOR) 40 MG tablet Take 1 tablet (40 mg total) by mouth every morning.  90 tablet  3  . dexlansoprazole (DEXILANT) 60 MG capsule Take 60 mg by mouth every morning.       Marland Kitchen Dextromethorphan-Guaifenesin (CORICIDIN HBP CONGESTION/COUGH PO) Take 2 tablets by mouth every 4 (four) hours as needed. For bronchitis       . LORazepam (ATIVAN) 1 MG tablet Take 1 mg by mouth every 4 (four) hours as needed. For nerves      . nitroGLYCERIN (NITROSTAT) 0.4 MG SL tablet Place 0.4 mg under the tongue every 5 (five) minutes as needed. Chest Pain      . polyethylene glycol powder (GLYCOLAX/MIRALAX) powder Take 17 g by  mouth 2 (two) times daily.      Marland Kitchen tiotropium (SPIRIVA) 18 MCG inhalation capsule Place 18 mcg into inhaler and inhale every morning.       No current facility-administered medications for this visit.    Discontinued Meds:   There are no discontinued medications.  Patient Active Problem List  Diagnosis  . Hypertension  . Hyperlipidemia  . Gastroesophageal reflux disease  . COPD (chronic obstructive pulmonary disease)  . Chest pain  . Tobacco abuse    LABS    Component Value Date/Time   NA 138 10/07/2012 1218   NA 139 09/04/2011 0502   NA 137 08/29/2011 2343   K 4.4 10/07/2012 1218   K 3.6 09/04/2011 0502   K 3.2* 08/29/2011 2343   CL 105 10/07/2012 1218   CL 103 09/04/2011 0502   CL 99 08/29/2011 2343   CO2 26 10/07/2012 1218   CO2 27 09/04/2011 0502   CO2 23 08/29/2011 2343   GLUCOSE 102* 10/07/2012 1218   GLUCOSE 155* 09/04/2011 0502   GLUCOSE 93 08/29/2011 2343   BUN 12 10/07/2012 1218  BUN 18 09/04/2011 0502   BUN 16 08/29/2011 2343   CREATININE 1.00 10/07/2012 1218   CREATININE 0.84 09/04/2011 0502   CREATININE 1.03 08/29/2011 2343   CALCIUM 9.1 10/07/2012 1218   CALCIUM 8.6 09/04/2011 0502   CALCIUM 9.3 08/29/2011 2343   GFRNONAA 80* 10/07/2012 1218   GFRNONAA >90 09/04/2011 0502   GFRNONAA 78* 08/29/2011 2343   GFRAA >90 10/07/2012 1218   GFRAA >90 09/04/2011 0502   GFRAA 90* 08/29/2011 2343   CMP     Component Value Date/Time   NA 138 10/07/2012 1218   K 4.4 10/07/2012 1218   CL 105 10/07/2012 1218   CO2 26 10/07/2012 1218   GLUCOSE 102* 10/07/2012 1218   BUN 12 10/07/2012 1218   CREATININE 1.00 10/07/2012 1218   CALCIUM 9.1 10/07/2012 1218   PROT 6.3 10/07/2012 1218   ALBUMIN 3.8 10/07/2012 1218   AST 22 10/07/2012 1218   ALT 22 10/07/2012 1218   ALKPHOS 68 10/07/2012 1218   BILITOT 0.3 10/07/2012 1218   GFRNONAA 80* 10/07/2012 1218   GFRAA >90 10/07/2012 1218       Component Value Date/Time   WBC 7.7 10/07/2012 1218   WBC 16.3* 09/04/2011 0502   WBC 10.3 08/29/2011  2343   HGB 14.9 10/07/2012 1218   HGB 13.5 09/04/2011 0502   HGB 15.2 08/29/2011 2343   HCT 43.3 10/07/2012 1218   HCT 38.5* 09/04/2011 0502   HCT 44.8 08/29/2011 2343   MCV 93.1 10/07/2012 1218   MCV 92.5 09/04/2011 0502   MCV 93.7 08/29/2011 2343    Lipid Panel  No results found for this basename: chol, trig, hdl, cholhdl, vldl, ldlcalc    ABG No results found for this basename: phart, pco2, pco2art, po2, po2art, hco3, tco2, acidbasedef, o2sat     No results found for this basename: TSH   BNP (last 3 results) No results found for this basename: PROBNP,  in the last 8760 hours Cardiac Panel (last 3 results) No results found for this basename: CKTOTAL, CKMB, TROPONINI, RELINDX,  in the last 72 hours  Iron/TIBC/Ferritin No results found for this basename: iron, tibc, ferritin     EKG Orders placed during the hospital encounter of 10/07/12  . ED EKG  . ED EKG  . EKG     Prior Assessment and Plan Problem List as of 12/26/2012     ICD-9-CM     Cardiology Problems   Hypertension   Last Assessment & Plan   11/07/2012 Office Visit Written 11/07/2012  3:29 PM by Kathlen Brunswick, MD     Blood pressure control appears fairly good, but he may still benefit from an additional antihypertensive agent.  Treatment with Amlodipine initiated both for its antihypertensive and anti-ischemic effects.    Hyperlipidemia   Last Assessment & Plan   11/07/2012 Office Visit Written 11/07/2012  3:28 PM by Kathlen Brunswick, MD     Recent lipid profile was excellent.  Patient was already treated with a low-dose statin.  We will advance to moderate dose therapy.      Other   Gastroesophageal reflux disease   Last Assessment & Plan   11/07/2012 Office Visit Edited 11/10/2012 12:39 PM by Kathlen Brunswick, MD     Rx with PPI.  Dosage could be increased if symptoms continue and do not appear to be cardiac in origin.    COPD (chronic obstructive pulmonary disease)   Last Assessment & Plan   11/07/2012 Office  Visit Edited  11/10/2012 12:38 PM by Kathlen Brunswick, MD     Discontinuation of tobacco use with the assistance of transdermal nicotine advised.  Teaching provided provided by the nursing staff to promote an optimal quit attempt.    Chest pain   Last Assessment & Plan   11/07/2012 Office Visit Edited 11/07/2012  3:30 PM by Kathlen Brunswick, MD     Atypical discomfort persists.  There is no evidence for significant myocardial ischemia, but a small area not demonstrated by stress echocardiography could be the etiology of his symptoms.  Amlodipine will be added to his medical regime as a therapeutic trial for his chest discomfort and for better management of hypertension.  Patient has impaired exercise capacity, likely as a result of a sedentary lifestyle.  Increase exercise advised.    Tobacco abuse   Last Assessment & Plan   11/07/2012 Office Visit Written 11/07/2012  3:30 PM by Kathlen Brunswick, MD     Quit attempt advised with the assistance of transdermal nicotine patches.  We will continue to monitor.        Imaging: No results found.

## 2012-12-26 NOTE — Assessment & Plan Note (Addendum)
Hypertension is generally well controlled, but systolic blood pressure intermittently elevated above 140. Amlodipine dose will be increased as noted above.

## 2013-02-25 ENCOUNTER — Ambulatory Visit (INDEPENDENT_AMBULATORY_CARE_PROVIDER_SITE_OTHER): Payer: Medicare Other | Admitting: Gastroenterology

## 2013-02-25 ENCOUNTER — Encounter: Payer: Self-pay | Admitting: Gastroenterology

## 2013-02-25 ENCOUNTER — Other Ambulatory Visit: Payer: Self-pay

## 2013-02-25 VITALS — BP 132/74 | HR 77 | Temp 97.6°F | Ht 72.0 in | Wt 208.4 lb

## 2013-02-25 DIAGNOSIS — R1310 Dysphagia, unspecified: Secondary | ICD-10-CM

## 2013-02-25 DIAGNOSIS — K219 Gastro-esophageal reflux disease without esophagitis: Secondary | ICD-10-CM

## 2013-02-25 DIAGNOSIS — R1013 Epigastric pain: Secondary | ICD-10-CM

## 2013-02-25 DIAGNOSIS — G8929 Other chronic pain: Secondary | ICD-10-CM

## 2013-02-25 DIAGNOSIS — K5909 Other constipation: Secondary | ICD-10-CM | POA: Insufficient documentation

## 2013-02-25 DIAGNOSIS — R1010 Upper abdominal pain, unspecified: Secondary | ICD-10-CM | POA: Insufficient documentation

## 2013-02-25 DIAGNOSIS — K5900 Constipation, unspecified: Secondary | ICD-10-CM

## 2013-02-25 DIAGNOSIS — R1314 Dysphagia, pharyngoesophageal phase: Secondary | ICD-10-CM

## 2013-02-25 DIAGNOSIS — R1319 Other dysphagia: Secondary | ICD-10-CM

## 2013-02-25 NOTE — Assessment & Plan Note (Signed)
Pp epigastric pain with poorly controlled GERD on Dexilant. EGD as planned. Some of upper abdominal pain on exam likely musculoskeletal in nature from chronic coughing. Await EGD findings. May need CT to further evaluate (CT last done in 05/2011).

## 2013-02-25 NOTE — Progress Notes (Signed)
Primary Care Physician:  Alice Reichert, MD  Primary Gastroenterologist:  Roetta Sessions, MD   Chief Complaint  Patient presents with  . Dysphagia    HPI:  Trevor Berg is a 62 y.o. male here to reschedule his upper endoscopy with esophageal dilation. Patient was seen in November 2013 for dysphagia to solid foods. Also with heartburn on Dexilant. Epigastric pain and constipation. We scheduled him for upper endoscopy but when he presented to and in the morning of the procedure he was having chest pain and sent to the emergency department. Patient has been following up with Dr. Dietrich Pates his cardiologist. He had a negative stress nuclear study. Some of this chest pain felt to be related to chronic lung disease.   Patient reports that he continues to have dysphagia with solid foods but now with big pills too. Coughs meat up at times. Has to try and wash foods down but sometimes cannot. Feels food/pills hang up in chest. Heartburn even on Dexilant. No hematemesis. Complains of pain which begins in the epigastrium and radiates up into the chest, associated with meals. Also complains of abdominal wall soreness with coughing. Denies unintentional weight loss. BM every day on Miralax. No melena, brbpr. Not ready to do colonoscopy (09/2004 last one).   Workup last fall included abdominal ultrasound. He had fatty liver, with normal LFTs. Normal CBC. 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 Vallecular residuals   Current Outpatient Prescriptions  Medication Sig Dispense Refill  . acetaminophen (TYLENOL) 650 MG CR tablet Take 650 mg by mouth daily as needed. For headache pain       . albuterol (PROVENTIL HFA;VENTOLIN HFA) 108 (90 BASE) MCG/ACT inhaler Inhale 2 puffs into the lungs every 6 (six) hours as needed. Shortness of Breath      . amLODipine (NORVASC) 5 MG tablet Take 1 tablet (5 mg total) by mouth daily.  30 tablet  6  .  aspirin 325 MG tablet Take 325 mg by mouth daily.      Marland Kitchen atenolol (TENORMIN) 50 MG tablet Take 50 mg by mouth every morning.       Marland Kitchen atorvastatin (LIPITOR) 20 MG tablet Take 20 mg by mouth daily.       Marland Kitchen atorvastatin (LIPITOR) 40 MG tablet Take 1 tablet (40 mg total) by mouth every morning.  90 tablet  3  . dexlansoprazole (DEXILANT) 60 MG capsule Take 60 mg by mouth every morning.       Marland Kitchen Dextromethorphan-Guaifenesin (CORICIDIN HBP CONGESTION/COUGH PO) Take 2 tablets by mouth every 4 (four) hours as needed. For bronchitis       . LORazepam (ATIVAN) 1 MG tablet Take 1 mg by mouth every 4 (four) hours as needed. For nerves      . nitroGLYCERIN (NITROSTAT) 0.4 MG SL tablet Place 0.4 mg under the tongue every 5 (five) minutes as needed. Chest Pain      . polyethylene glycol powder (GLYCOLAX/MIRALAX) powder Take 17 g by mouth 2 (two) times daily.      Marland Kitchen tiotropium (SPIRIVA) 18 MCG inhalation capsule Place 18 mcg into inhaler and inhale every morning.       No current facility-administered medications for this visit.    Allergies as of 02/25/2013 - Review Complete 02/25/2013  Allergen Reaction Noted  . Tomato Rash 06/16/2011    Past Medical History  Diagnosis Date  . Hypertension   . Hyperlipidemia   . Anxiety   . Gastroesophageal reflux  disease     possible small Schatzki's ring; esophageal dilatation in 2005  . COPD (chronic obstructive pulmonary disease)     Chronic bronchitis; 100-pack-year cigarette consumption  . Chest pain 2005    noncritical coronary disease in 2005: 40% distal RCA, 30% CX, 50% OM 2, EF-60%,  . Overweight   . Tobacco abuse     100 pack years    Past Surgical History  Procedure Laterality Date  . Cervical spine surgery      C5-6 and C6-7: Relief of spinal stenosis with corpectomy, foraminotomy and fusion  . Orif ankle fracture      right  . Appendectomy    . Tonsillectomy    . Esophagogastroduodenoscopy  09/27/2004    RMR:   A couple of tiny, distal  esophageal erosions, otherwise normal esophagus aside from a Schatzki ring status post Maloney dilation as described above/ Small hiatal hernia, otherwise normal stomach, normal D1-D2  . Colonoscopy  10/19/2004    RMR:   Friable anal canal, likely source of patient's hematochezia/  Otherwise normal rectum, normal colon.  . Orif tibia fracture      Left    Family History  Problem Relation Age of Onset  . Heart attack Mother     deceased, age 29s  . Ulcers Brother   . Diabetes Brother   . Colon cancer Neg Hx   . Hypertension      History   Social History  . Marital Status: Married    Spouse Name: N/A    Number of Children: 3  . Years of Education: N/A   Occupational History  . disability    Social History Main Topics  . Smoking status: Current Every Day Smoker -- 2.00 packs/day for 50 years    Types: Cigarettes  . Smokeless tobacco: Not on file     Comment: 1 pack per day as of 08/2012  . Alcohol Use: 1.2 oz/week    2 Cans of beer per week     Comment: occasionally, usually holidays but sometimes on weekends, prior heavy use  . Drug Use: No  . Sexually Active: Not on file   Other Topics Concern  . Not on file   Social History Narrative  . No narrative on file      ROS:  General: Negative for anorexia, weight loss, fever, chills, fatigue, weakness. Eyes: Negative for vision changes.  ENT: Negative for hoarseness, difficulty swallowing , nasal congestion. CV: Negative for chest pain, angina, palpitations,  peripheral edema. Chronic dyspnea on exertion. Respiratory: Negative for dyspnea at rest. Chronic dyspnea on exertion, cough. Negative for sputum, wheezing.  GI: See history of present illness. GU:  Negative for dysuria, hematuria, urinary incontinence, urinary frequency, nocturnal urination.  MS: Negative for joint pain, low back pain.  Derm: Negative for rash or itching.  Neuro: Negative for weakness, abnormal sensation, seizure, frequent headaches, memory  loss, confusion.  Psych: Negative for anxiety, depression, suicidal ideation, hallucinations.  Endo: Negative for unusual weight change.  Heme: Negative for bruising or bleeding. Allergy: Negative for rash or hives.    Physical Examination:  BP 132/74  Pulse 77  Temp(Src) 97.6 F (36.4 C) (Oral)  Ht 6' (1.829 m)  Wt 208 lb 6.4 oz (94.53 kg)  BMI 28.26 kg/m2   General: Well-nourished, well-developed in no acute distress. Accompanied by wife. Head: Normocephalic, atraumatic.   Eyes: Conjunctiva pink, no icterus. Mouth: Oropharyngeal mucosa moist and pink , no lesions erythema or exudate. Neck: Supple without  thyromegaly, masses, or lymphadenopathy.  Lungs: Clear to auscultation bilaterally.  Heart: Regular rate and rhythm, no murmurs rubs or gallops.  Abdomen: Bowel sounds are normal, tender throughout the upper abdomen and along lower rib cage margin bilaterally, nondistended, no hepatosplenomegaly or masses, no abdominal bruits or    hernia , no rebound or guarding.   Rectal: Not performed Extremities: No lower extremity edema. No clubbing or deformities.  Neuro: Alert and oriented x 4 , grossly normal neurologically.  Skin: Warm and dry, no rash or jaundice.   Psych: Alert and cooperative, normal mood and affect.

## 2013-02-25 NOTE — Assessment & Plan Note (Signed)
Doing better on Miralax. Patient not interested in colonoscopy at this time. He is due in 2015.

## 2013-02-25 NOTE — Patient Instructions (Addendum)
We have scheduled you for an upper endoscopy with possible esophageal dilation with Dr. Jena Gauss. Please see separate instructions.

## 2013-02-25 NOTE — Assessment & Plan Note (Signed)
Chronic GERD with breakthrough heartburn on Dexilant. Also with solid food dysphagia and now with dysphagia to large pills. Remote EGD with dilation helped in the past. Patient also complains with atypical chest pain related to meals, epigastric pain with meals. Abdominal ultrasound showed fatty liver last year. Recommend EGD for further evaluation of abdominal/chest pain as well as dysphagia. Suspect he has recurrent esophageal ring or stricture.  I have discussed the risks, alternatives, benefits with regards to but not limited to the risk of reaction to medication, bleeding, infection, perforation and the patient is agreeable to proceed. Written consent to be obtained.

## 2013-02-25 NOTE — Progress Notes (Signed)
Cc PCP 

## 2013-03-10 ENCOUNTER — Encounter (HOSPITAL_COMMUNITY): Payer: Self-pay | Admitting: Pharmacy Technician

## 2013-03-19 ENCOUNTER — Encounter (HOSPITAL_COMMUNITY)
Admission: RE | Disposition: A | Discharge status: Home / Self Care | Payer: Self-pay | Source: Ambulatory Visit | Attending: Internal Medicine

## 2013-03-19 ENCOUNTER — Encounter (HOSPITAL_COMMUNITY): Payer: Self-pay

## 2013-03-19 ENCOUNTER — Ambulatory Visit (HOSPITAL_COMMUNITY)
Admission: RE | Admit: 2013-03-19 | Discharge: 2013-03-19 | Disposition: A | Discharge status: Home / Self Care | Payer: Medicare Other | Source: Ambulatory Visit | Attending: Internal Medicine | Admitting: Internal Medicine

## 2013-03-19 DIAGNOSIS — K219 Gastro-esophageal reflux disease without esophagitis: Secondary | ICD-10-CM

## 2013-03-19 DIAGNOSIS — I10 Essential (primary) hypertension: Secondary | ICD-10-CM | POA: Insufficient documentation

## 2013-03-19 DIAGNOSIS — R1013 Epigastric pain: Secondary | ICD-10-CM | POA: Insufficient documentation

## 2013-03-19 DIAGNOSIS — R1310 Dysphagia, unspecified: Secondary | ICD-10-CM

## 2013-03-19 DIAGNOSIS — J4489 Other specified chronic obstructive pulmonary disease: Secondary | ICD-10-CM | POA: Insufficient documentation

## 2013-03-19 DIAGNOSIS — A048 Other specified bacterial intestinal infections: Secondary | ICD-10-CM | POA: Insufficient documentation

## 2013-03-19 DIAGNOSIS — J449 Chronic obstructive pulmonary disease, unspecified: Secondary | ICD-10-CM | POA: Insufficient documentation

## 2013-03-19 DIAGNOSIS — K2940 Chronic atrophic gastritis without bleeding: Secondary | ICD-10-CM | POA: Insufficient documentation

## 2013-03-19 HISTORY — PX: ESOPHAGOGASTRODUODENOSCOPY (EGD) WITH ESOPHAGEAL DILATION: SHX5812

## 2013-03-19 SURGERY — ESOPHAGOGASTRODUODENOSCOPY (EGD) WITH ESOPHAGEAL DILATION
Anesthesia: Moderate Sedation

## 2013-03-19 MED ORDER — MEPERIDINE HCL 100 MG/ML IJ SOLN
INTRAMUSCULAR | Status: AC
  Filled 2013-03-19: qty 1

## 2013-03-19 MED ORDER — MEPERIDINE HCL 100 MG/ML IJ SOLN
INTRAMUSCULAR | Status: DC | PRN
  Administered 2013-03-19: 50 mg via INTRAVENOUS
  Administered 2013-03-19 (×2): 25 mg via INTRAVENOUS

## 2013-03-19 MED ORDER — ONDANSETRON HCL 4 MG/2ML IJ SOLN
INTRAMUSCULAR | Status: AC
  Filled 2013-03-19: qty 2

## 2013-03-19 MED ORDER — ONDANSETRON HCL 4 MG/2ML IJ SOLN
INTRAMUSCULAR | Status: DC | PRN
  Administered 2013-03-19: 4 mg via INTRAVENOUS

## 2013-03-19 MED ORDER — MIDAZOLAM HCL 5 MG/5ML IJ SOLN
INTRAMUSCULAR | Status: AC
  Filled 2013-03-19: qty 10

## 2013-03-19 MED ORDER — SODIUM CHLORIDE 0.9 % IV SOLN
INTRAVENOUS | Status: DC
  Administered 2013-03-19: 09:00:00 via INTRAVENOUS

## 2013-03-19 MED ORDER — BUTAMBEN-TETRACAINE-BENZOCAINE 2-2-14 % EX AERO
INHALATION_SPRAY | CUTANEOUS | Status: DC | PRN
  Administered 2013-03-19: 2 via TOPICAL

## 2013-03-19 MED ORDER — STERILE WATER FOR IRRIGATION IR SOLN
Status: DC | PRN
  Administered 2013-03-19: 10:00:00

## 2013-03-19 MED ORDER — MIDAZOLAM HCL 5 MG/5ML IJ SOLN
INTRAMUSCULAR | Status: DC | PRN
  Administered 2013-03-19 (×3): 2 mg via INTRAVENOUS

## 2013-03-19 NOTE — Interval H&P Note (Signed)
History and Physical Interval Note:  03/19/2013 9:49 AM  Trevor Berg  has presented today for surgery, with the diagnosis of GERD/ Dysphagia/ atipical chest pain  The various methods of treatment have been discussed with the patient and family. After consideration of risks, benefits and other options for treatment, the patient has consented to  Procedure(s) with comments: ESOPHAGOGASTRODUODENOSCOPY (EGD) WITH ESOPHAGEAL DILATION (N/A) - 9:30 AM as a surgical intervention .  The patient's history has been reviewed, patient examined, no change in status, stable for surgery.  I have reviewed the patient's chart and labs.  Questions were answered to the patient's satisfaction.     Eula Listen   Colonoscopy today per plan .The risks, benefits, limitations, alternatives and imponderables have been reviewed with the patient. Questions have been answered. All parties are agreeable.

## 2013-03-19 NOTE — Op Note (Signed)
[facility] [address] [place], [address]   ENDOSCOPY PROCEDURE REPORT  PATIENT: Trevor, Berg  MR#: [id number] BIRTHDATE: [birth year] , 61  yrs. old GENDER: Male ENDOSCOPIST: R.  Roetta Sessions, MD FACP FACG REFERRED BY:  Butch Penny, M.D. PROCEDURE DATE:  03/19/2013 PROCEDURE:     EGD with Elease Hashimoto dilation followed by gastric biopsy  INDICATIONS:     epigastric pain; recurrent esophageal dysphagia-history of Schatzki's ring  INFORMED CONSENT:   The risks, benefits, limitations, alternatives and imponderables have been discussed.  The potential for biopsy, esophogeal dilation, etc. have also been reviewed.  Questions have been answered.  All parties agreeable.  Please see the history and physical in the medical record for more information.  MEDICATIONS:   Versed 6 mg IV and Demerol 100 mg IV in divided doses. Zofran 4 mg IV. Cetacaine spray.  DESCRIPTION OF PROCEDURE:   The EG-[id number] ([id number])  endoscope was introduced through the mouth and advanced to the second portion of the duodenum without difficulty or limitations.  The mucosal surfaces were surveyed very carefully during advancement of the scope and upon withdrawal.  Retroflexion view of the proximal stomach and esophagogastric junction was performed.      FINDINGS: Normal-appearing, patent tubular esophagus. Stomach empty. fairly impressive streaky erythema in the antrum. No ulcer or infiltrating process. One tiny erosion up in the cardia just distal to the EG junction on retroflexion.  THERAPEUTIC / DIAGNOSTIC MANEUVERS PERFORMED:  A 56 French Maloney dilator was passed to full insertion easily. A look back revealed no apparent complication related to this maneuver. Subsequently, biopsies of the abnormal antrum taken for histologic study   COMPLICATIONS:  None  IMPRESSION:   Normal esophagus-status post esophageal dilation. Abnormal stomach of uncertain clinical significance-status  post biopsy  RECOMMENDATIONS:  No change in medical regimen for the time being. We'll go ahead and proceed with a CT of abdomen and pelvis. Further recommendations to follow. Followup on pathology.    _______________________________ R. Roetta Sessions, MD FACP [facility] eSigned:  R. Roetta Sessions, MD FACP [facility] 03/19/2013 10:18 AM     CC:

## 2013-03-19 NOTE — Discharge Instructions (Addendum)
 CT abdomen and pelvis with contrast to further evaluate abdominal pain-negative EGD. Your insurance requires a pre-certification before a CT. Dr. Luvenia Starch office is working on getting that done. His office will call you in a few days to let you know when your CT is scheduled for.  Esophagogastroduodenoscopy Care After Refer to this sheet in the next few weeks. These instructions provide you with information on caring for yourself after your procedure. Your caregiver may also give you more specific instructions. Your treatment has been planned according to current medical practices, but problems sometimes occur. Call your caregiver if you have any problems or questions after your procedure.  HOME CARE INSTRUCTIONS  Do not eat or drink anything until the numbing medicine (local anesthetic) has worn off and your gag reflex has returned. You will know that the local anesthetic has worn off when you can swallow comfortably.  Do not drive for 12 hours after the procedure or as directed by your caregiver.  Only take medicines as directed by your caregiver. SEEK MEDICAL CARE IF:   You cannot stop coughing.  You are not urinating at all or less than usual. SEEK IMMEDIATE MEDICAL CARE IF:  You have difficulty swallowing.  You cannot eat or drink.  You have worsening throat or chest pain.  You have dizziness, lightheadedness, or you faint.  You have nausea or vomiting.  You have chills.  You have a fever.  You have severe abdominal pain.  You have black, tarry, or bloody stools. Document Released: 10/02/2012 Document Reviewed: 09/18/2012 [facility] Patient Information 2014 [place], [place].

## 2013-03-19 NOTE — H&P (View-Only) (Signed)
Primary Care Physician:  MCINNIS,ANGUS G, MD  Primary Gastroenterologist:  Michael Rourk, MD   Chief Complaint  Patient presents with  . Dysphagia    HPI:  Trevor Berg is a 62 y.o. male here to reschedule his upper endoscopy with esophageal dilation. Patient was seen in November 2013 for dysphagia to solid foods. Also with heartburn on Dexilant. Epigastric pain and constipation. We scheduled him for upper endoscopy but when he presented to and in the morning of the procedure he was having chest pain and sent to the emergency department. Patient has been following up with Dr. Rothbart his cardiologist. He had a negative stress nuclear study. Some of this chest pain felt to be related to chronic lung disease.   Patient reports that he continues to have dysphagia with solid foods but now with big pills too. Coughs meat up at times. Has to try and wash foods down but sometimes cannot. Feels food/pills hang up in chest. Heartburn even on Dexilant. No hematemesis. Complains of pain which begins in the epigastrium and radiates up into the chest, associated with meals. Also complains of abdominal wall soreness with coughing. Denies unintentional weight loss. BM every day on Miralax. No melena, brbpr. Not ready to do colonoscopy (09/2004 last one).   Workup last fall included abdominal ultrasound. He had fatty liver, with normal LFTs. Normal CBC. 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 Vallecular residuals   Current Outpatient Prescriptions  Medication Sig Dispense Refill  . acetaminophen (TYLENOL) 650 MG CR tablet Take 650 mg by mouth daily as needed. For headache pain       . albuterol (PROVENTIL HFA;VENTOLIN HFA) 108 (90 BASE) MCG/ACT inhaler Inhale 2 puffs into the lungs every 6 (six) hours as needed. Shortness of Breath      . amLODipine (NORVASC) 5 MG tablet Take 1 tablet (5 mg total) by mouth daily.  30 tablet  6  .  aspirin 325 MG tablet Take 325 mg by mouth daily.      . atenolol (TENORMIN) 50 MG tablet Take 50 mg by mouth every morning.       . atorvastatin (LIPITOR) 20 MG tablet Take 20 mg by mouth daily.       . atorvastatin (LIPITOR) 40 MG tablet Take 1 tablet (40 mg total) by mouth every morning.  90 tablet  3  . dexlansoprazole (DEXILANT) 60 MG capsule Take 60 mg by mouth every morning.       . Dextromethorphan-Guaifenesin (CORICIDIN HBP CONGESTION/COUGH PO) Take 2 tablets by mouth every 4 (four) hours as needed. For bronchitis       . LORazepam (ATIVAN) 1 MG tablet Take 1 mg by mouth every 4 (four) hours as needed. For nerves      . nitroGLYCERIN (NITROSTAT) 0.4 MG SL tablet Place 0.4 mg under the tongue every 5 (five) minutes as needed. Chest Pain      . polyethylene glycol powder (GLYCOLAX/MIRALAX) powder Take 17 g by mouth 2 (two) times daily.      . tiotropium (SPIRIVA) 18 MCG inhalation capsule Place 18 mcg into inhaler and inhale every morning.       No current facility-administered medications for this visit.    Allergies as of 02/25/2013 - Review Complete 02/25/2013  Allergen Reaction Noted  . Tomato Rash 06/16/2011    Past Medical History  Diagnosis Date  . Hypertension   . Hyperlipidemia   . Anxiety   . Gastroesophageal reflux   disease     possible small Schatzki's ring; esophageal dilatation in 2005  . COPD (chronic obstructive pulmonary disease)     Chronic bronchitis; 100-pack-year cigarette consumption  . Chest pain 2005    noncritical coronary disease in 2005: 40% distal RCA, 30% CX, 50% OM 2, EF-60%,  . Overweight   . Tobacco abuse     100 pack years    Past Surgical History  Procedure Laterality Date  . Cervical spine surgery      C5-6 and C6-7: Relief of spinal stenosis with corpectomy, foraminotomy and fusion  . Orif ankle fracture      right  . Appendectomy    . Tonsillectomy    . Esophagogastroduodenoscopy  09/27/2004    RMR:   A couple of tiny, distal  esophageal erosions, otherwise normal esophagus aside from a Schatzki ring status post Maloney dilation as described above/ Small hiatal hernia, otherwise normal stomach, normal D1-D2  . Colonoscopy  10/19/2004    RMR:   Friable anal canal, likely source of patient's hematochezia/  Otherwise normal rectum, normal colon.  . Orif tibia fracture      Left    Family History  Problem Relation Age of Onset  . Heart attack Mother     deceased, age 50s  . Ulcers Brother   . Diabetes Brother   . Colon cancer Neg Hx   . Hypertension      History   Social History  . Marital Status: Married    Spouse Name: N/A    Number of Children: 3  . Years of Education: N/A   Occupational History  . disability    Social History Main Topics  . Smoking status: Current Every Day Smoker -- 2.00 packs/day for 50 years    Types: Cigarettes  . Smokeless tobacco: Not on file     Comment: 1 pack per day as of 08/2012  . Alcohol Use: 1.2 oz/week    2 Cans of beer per week     Comment: occasionally, usually holidays but sometimes on weekends, prior heavy use  . Drug Use: No  . Sexually Active: Not on file   Other Topics Concern  . Not on file   Social History Narrative  . No narrative on file      ROS:  General: Negative for anorexia, weight loss, fever, chills, fatigue, weakness. Eyes: Negative for vision changes.  ENT: Negative for hoarseness, difficulty swallowing , nasal congestion. CV: Negative for chest pain, angina, palpitations,  peripheral edema. Chronic dyspnea on exertion. Respiratory: Negative for dyspnea at rest. Chronic dyspnea on exertion, cough. Negative for sputum, wheezing.  GI: See history of present illness. GU:  Negative for dysuria, hematuria, urinary incontinence, urinary frequency, nocturnal urination.  MS: Negative for joint pain, low back pain.  Derm: Negative for rash or itching.  Neuro: Negative for weakness, abnormal sensation, seizure, frequent headaches, memory  loss, confusion.  Psych: Negative for anxiety, depression, suicidal ideation, hallucinations.  Endo: Negative for unusual weight change.  Heme: Negative for bruising or bleeding. Allergy: Negative for rash or hives.    Physical Examination:  BP 132/74  Pulse 77  Temp(Src) 97.6 F (36.4 C) (Oral)  Ht 6' (1.829 m)  Wt 208 lb 6.4 oz (94.53 kg)  BMI 28.26 kg/m2   General: Well-nourished, well-developed in no acute distress. Accompanied by wife. Head: Normocephalic, atraumatic.   Eyes: Conjunctiva pink, no icterus. Mouth: Oropharyngeal mucosa moist and pink , no lesions erythema or exudate. Neck: Supple without   thyromegaly, masses, or lymphadenopathy.  Lungs: Clear to auscultation bilaterally.  Heart: Regular rate and rhythm, no murmurs rubs or gallops.  Abdomen: Bowel sounds are normal, tender throughout the upper abdomen and along lower rib cage margin bilaterally, nondistended, no hepatosplenomegaly or masses, no abdominal bruits or    hernia , no rebound or guarding.   Rectal: Not performed Extremities: No lower extremity edema. No clubbing or deformities.  Neuro: Alert and oriented x 4 , grossly normal neurologically.  Skin: Warm and dry, no rash or jaundice.   Psych: Alert and cooperative, normal mood and affect.   

## 2013-03-20 ENCOUNTER — Other Ambulatory Visit: Payer: Self-pay | Admitting: Internal Medicine

## 2013-03-20 DIAGNOSIS — G8929 Other chronic pain: Secondary | ICD-10-CM

## 2013-03-20 DIAGNOSIS — R1013 Epigastric pain: Secondary | ICD-10-CM

## 2013-03-21 ENCOUNTER — Encounter (HOSPITAL_COMMUNITY): Payer: Self-pay | Admitting: Internal Medicine

## 2013-03-25 ENCOUNTER — Encounter: Payer: Self-pay | Admitting: Internal Medicine

## 2013-03-26 ENCOUNTER — Encounter: Payer: Self-pay | Admitting: Internal Medicine

## 2013-03-26 ENCOUNTER — Ambulatory Visit (HOSPITAL_COMMUNITY)
Admission: RE | Admit: 2013-03-26 | Discharge: 2013-03-26 | Disposition: A | Discharge status: Home / Self Care | Payer: Medicare Other | Source: Ambulatory Visit | Attending: Internal Medicine | Admitting: Internal Medicine

## 2013-03-26 ENCOUNTER — Telehealth: Payer: Self-pay

## 2013-03-26 DIAGNOSIS — R197 Diarrhea, unspecified: Secondary | ICD-10-CM | POA: Insufficient documentation

## 2013-03-26 DIAGNOSIS — I723 Aneurysm of iliac artery: Secondary | ICD-10-CM | POA: Insufficient documentation

## 2013-03-26 DIAGNOSIS — G8929 Other chronic pain: Secondary | ICD-10-CM

## 2013-03-26 DIAGNOSIS — R1013 Epigastric pain: Secondary | ICD-10-CM | POA: Insufficient documentation

## 2013-03-26 MED ORDER — AMOXICILL-CLARITHRO-LANSOPRAZ PO MISC: Freq: Two times a day (BID) | ORAL | Status: DC

## 2013-03-26 MED ORDER — IOHEXOL 300 MG/ML  SOLN
100.0000 mL | Freq: Once | INTRAMUSCULAR | Status: AC | PRN
  Administered 2013-03-26: 100 mL via INTRAVENOUS

## 2013-03-26 NOTE — Telephone Encounter (Signed)
Tried to call pt- LM to return call.  

## 2013-03-26 NOTE — Telephone Encounter (Signed)
Letter from: Corbin Ade Send letter to patient.  Send copy of letter with path to referring provider and PCP.   Patient needs Prevpac or generic equivalent x14 days. Stop Dexilant and Lipitor while on this regimen then resume; please disregard previous letter

## 2013-03-26 NOTE — Telephone Encounter (Signed)
Received fax from pharmacy. Insurance will not pay for prevpac or pylera. Called in generics per RMR to [facility].

## 2013-03-26 NOTE — Telephone Encounter (Signed)
Pt and wife came by office and explained everything to them. rx sent to the pharmacy.

## 2013-04-04 ENCOUNTER — Encounter: Payer: Self-pay | Admitting: Internal Medicine

## 2013-04-24 ENCOUNTER — Ambulatory Visit: Payer: Medicare Other | Admitting: Cardiology

## 2013-05-06 ENCOUNTER — Encounter: Payer: Self-pay | Admitting: Internal Medicine

## 2013-05-07 ENCOUNTER — Encounter: Payer: Self-pay | Admitting: Gastroenterology

## 2013-05-07 ENCOUNTER — Ambulatory Visit (INDEPENDENT_AMBULATORY_CARE_PROVIDER_SITE_OTHER): Payer: Medicare Other | Admitting: Gastroenterology

## 2013-05-07 VITALS — BP 118/78 | HR 70 | Temp 98.4°F | Ht 66.0 in | Wt 208.8 lb

## 2013-05-07 DIAGNOSIS — J4489 Other specified chronic obstructive pulmonary disease: Secondary | ICD-10-CM

## 2013-05-07 DIAGNOSIS — R1013 Epigastric pain: Secondary | ICD-10-CM

## 2013-05-07 DIAGNOSIS — J449 Chronic obstructive pulmonary disease, unspecified: Secondary | ICD-10-CM

## 2013-05-07 DIAGNOSIS — K5900 Constipation, unspecified: Secondary | ICD-10-CM

## 2013-05-07 DIAGNOSIS — R059 Cough, unspecified: Secondary | ICD-10-CM

## 2013-05-07 DIAGNOSIS — K5909 Other constipation: Secondary | ICD-10-CM

## 2013-05-07 DIAGNOSIS — G8929 Other chronic pain: Secondary | ICD-10-CM

## 2013-05-07 DIAGNOSIS — K219 Gastro-esophageal reflux disease without esophagitis: Secondary | ICD-10-CM

## 2013-05-07 DIAGNOSIS — R058 Other specified cough: Secondary | ICD-10-CM

## 2013-05-07 DIAGNOSIS — R05 Cough: Secondary | ICD-10-CM

## 2013-05-07 MED ORDER — PANTOPRAZOLE SODIUM 40 MG PO TBEC: 40.0000 mg | DELAYED_RELEASE_TABLET | Freq: Every day | ORAL | Status: DC

## 2013-05-07 NOTE — Patient Instructions (Signed)
1. Stop Dexilant. Start pantoprazole 1 capsule 30 minutes before breakfast daily. 2. Decrease MiraLax to once daily. If you continue to have very loose stool, take MiraLax only at bedtime on days you have not had a bowel movement. Call if you continue to have diarrhea. 3. Referral to Dr. Juanetta Gosling for chronic cough which is likely resulting in chronic chest/abdominal wall pain. 4. I will request copy of your labs from Dr. Renard Matter for further review.

## 2013-05-07 NOTE — Progress Notes (Signed)
Primary Care Physician: Alice Reichert, MD  Primary Gastroenterologist:  Roetta Sessions, MD   Chief Complaint  Patient presents with  . Follow-up    HPI: Trevor Berg is a 62 y.o. male here for followup of recent EGD. He was found to have H. pylori gastritis. Normal-appearing esophagus but dilated to 56 French Maloney dilator. Last seen in the office in April for dysphagia to solid foods, refractory heartburn, epigastric pain and constipation. Next colonoscopy due December 2015. He also had a CT scan since his endoscopy for further evaluation of epigastric pain. No acute findings found. He had bilateral common iliac artery aneurysms, reviewed personally by Dr. Jena Gauss with radiologist who felt like no further management was necessary, see report. There was suggestion of constipation.  Patient continues to complain of upper abdominal pain. He states it is constant and keeps him up  During the night. With further discussion, he admits to forceful coughing throughout the day. He is coughing up milky phlegm. He uses his albuterol inhaler 4 times a day and spiriva as scheduled. Never evaluated by pulmonologist. Denies bloody sputum. Pain in upper abdomen and lower chest/RIBS constant. Some worsening of his upper normal pain related to meals.  No nausea. Heartburn on Dexilant. Some problems with swallowing meat but improved. BM anywhere from very loose to watery. When runs out MiraLax then does not have BM at all. Takes BID. No melena rectal bleeding. No weight loss.    Current Outpatient Prescriptions  Medication Sig Dispense Refill  . albuterol (PROVENTIL HFA;VENTOLIN HFA) 108 (90 BASE) MCG/ACT inhaler Inhale 2 puffs into the lungs every 6 (six) hours as needed. Shortness of Breath      . amLODipine (NORVASC) 5 MG tablet Take 1 tablet (5 mg total) by mouth daily.  30 tablet  6  . aspirin EC 81 MG tablet Take 81 mg by mouth daily.      Marland Kitchen atenolol (TENORMIN) 50 MG tablet Take 50 mg by mouth every  morning.       Marland Kitchen atorvastatin (LIPITOR) 20 MG tablet Take 20 mg by mouth daily.       Marland Kitchen atorvastatin (LIPITOR) 40 MG tablet Take 40 mg by mouth daily.      Marland Kitchen dexlansoprazole (DEXILANT) 60 MG capsule Take 60 mg by mouth every morning.       Marland Kitchen LORazepam (ATIVAN) 1 MG tablet Take 1 mg by mouth every 4 (four) hours as needed. For nerves      . nitroGLYCERIN (NITROSTAT) 0.4 MG SL tablet Place 0.4 mg under the tongue every 5 (five) minutes as needed. Chest Pain      . polyethylene glycol powder (GLYCOLAX/MIRALAX) powder Take 17 g by mouth 2 (two) times daily.      Marland Kitchen tiotropium (SPIRIVA) 18 MCG inhalation capsule Place 18 mcg into inhaler and inhale every morning.       No current facility-administered medications for this visit.    Allergies as of 05/07/2013 - Review Complete 05/07/2013  Allergen Reaction Noted  . Tomato Rash 06/16/2011   Past Medical History  Diagnosis Date  . Hypertension   . Hyperlipidemia   . Anxiety   . Gastroesophageal reflux disease     possible small Schatzki's ring; esophageal dilatation in 2005  . COPD (chronic obstructive pulmonary disease)     Chronic bronchitis; 100-pack-year cigarette consumption  . Chest pain 2005    noncritical coronary disease in 2005: 40% distal RCA, 30% CX, 50% OM 2, EF-60%,  . Overweight(278.02)   .  Tobacco abuse     100 pack years  . H. pylori infection 2014    treated with prevpac   Past Surgical History  Procedure Laterality Date  . Cervical spine surgery      C5-6 and C6-7: Relief of spinal stenosis with corpectomy, foraminotomy and fusion  . Orif ankle fracture      right  . Appendectomy    . Tonsillectomy    . Esophagogastroduodenoscopy  09/27/2004    RMR:   A couple of tiny, distal esophageal erosions, otherwise normal esophagus aside from a Schatzki ring status post Maloney dilation as described above/ Small hiatal hernia, otherwise normal stomach, normal D1-D2  . Colonoscopy  10/19/2004    RMR:   Friable anal canal,  likely source of patient's hematochezia/  Otherwise normal rectum, normal colon.  . Orif tibia fracture      Left  . Esophagogastroduodenoscopy (egd) with esophageal dilation N/A 03/19/2013    WUJ:[id number] esophagus s/p dilation/Abnormal stomach of uncertain clinical significance-s/p bx positive for H. pylori, status post Prevpac treatment     ROS:  General: Negative for anorexia, weight loss, fever, chills, fatigue, weakness. ENT: Negative for hoarseness, difficulty swallowing , nasal congestion. CV: Negative for chest pain, angina, palpitations,peripheral edema. See history of present illness Respiratory: Negative for dyspnea at rest,  wheezing. See history of present illness GI: See history of present illness. GU:  Negative for dysuria, hematuria, urinary incontinence, urinary frequency, nocturnal urination.  Endo: Negative for unusual weight change.    Physical Examination:   BP 118/78  Pulse 70  Temp(Src) 98.4 F (36.9 C) (Oral)  Ht 5\' 6"  (1.676 m)  Wt 208 lb 12.8 oz (94.711 kg)  BMI 33.72 kg/m2  General: Well-nourished, well-developed in no acute distress. Accompanied by wife Eyes: No icterus. Mouth: Oropharyngeal mucosa moist and pink , no lesions erythema or exudate. Lungs: Clear to auscultation bilaterally.  Heart: Regular rate and rhythm, no murmurs rubs or gallops.  Abdomen: Bowel sounds are normal, mild to moderate tenderness throughout the entire upper abdomen and bilateral lower rib cage margin. No CVA tenderness. nondistended, no hepatosplenomegaly or masses, no abdominal bruits or hernia , no rebound or guarding.   Extremities: No lower extremity edema. No clubbing or deformities. Neuro: Alert and oriented x 4   Skin: Warm and dry, no jaundice.   Psych: Alert and cooperative, normal mood and affect.

## 2013-05-07 NOTE — Assessment & Plan Note (Addendum)
Suspect chronic upper abdominal pain related to abdominal wall pain from frequent coughing. Also with lower chest wall pain on exam. Symptoms worse with movement. He has never seen a pulmonologist for his COPD. Given ongoing discomfort, we were refer him to Dr. Juanetta Gosling to make sure he has appropriate management of his COPD. We will change PPI to see if we can get any better improvement in his postprandial abdominal pain. Regarding loose stools, suspect MiraLax related. We'll start with decreasing dose as patient states that he develops recurrent constipation when he stops the medication. If he continues to have frequent loose stools, consider colonoscopy sooner than scheduled.   1. Stop Dexilant. Start pantoprazole 1 capsule 30 minutes before breakfast daily. 2. Decrease MiraLax to once daily. If you continue to have very loose stool, take MiraLax only at bedtime on days you have not had a bowel movement. Call if you continue to have diarrhea. 3. Referral to Dr. Juanetta Gosling for chronic cough which is likely resulting in chronic chest/abdominal wall pain. 4. I will request copy of your labs from Dr. Renard Matter for further review.

## 2013-05-07 NOTE — Progress Notes (Signed)
CC PCP 

## 2013-05-08 ENCOUNTER — Other Ambulatory Visit: Payer: Self-pay | Admitting: Gastroenterology

## 2013-05-08 DIAGNOSIS — J449 Chronic obstructive pulmonary disease, unspecified: Secondary | ICD-10-CM

## 2013-05-08 DIAGNOSIS — R079 Chest pain, unspecified: Secondary | ICD-10-CM

## 2013-05-08 DIAGNOSIS — R05 Cough: Secondary | ICD-10-CM

## 2013-05-08 DIAGNOSIS — R058 Other specified cough: Secondary | ICD-10-CM

## 2013-05-14 NOTE — Progress Notes (Signed)
Requested latest labs from PCP. Done December 2013. Creatinine 0.96, total bilirubin 0.4, alkaline phosphatase 70, AST 24, ALT 23, albumin 4.3, white blood cell count 9300, hemoglobin 15.1, hematocrit 43.9, MCV 92.6, platelets 225,000.

## 2013-05-19 ENCOUNTER — Ambulatory Visit (HOSPITAL_COMMUNITY)
Admission: RE | Admit: 2013-05-19 | Discharge: 2013-05-19 | Disposition: A | Discharge status: Home / Self Care | Payer: Medicare Other | Source: Ambulatory Visit | Attending: Pulmonary Disease | Admitting: Pulmonary Disease

## 2013-05-19 ENCOUNTER — Other Ambulatory Visit (HOSPITAL_COMMUNITY): Payer: Self-pay | Admitting: Pulmonary Disease

## 2013-05-19 DIAGNOSIS — M545 Low back pain, unspecified: Secondary | ICD-10-CM | POA: Insufficient documentation

## 2013-05-19 DIAGNOSIS — M5380 Other specified dorsopathies, site unspecified: Secondary | ICD-10-CM | POA: Insufficient documentation

## 2013-05-19 DIAGNOSIS — M546 Pain in thoracic spine: Secondary | ICD-10-CM | POA: Insufficient documentation

## 2013-05-19 DIAGNOSIS — R109 Unspecified abdominal pain: Secondary | ICD-10-CM | POA: Insufficient documentation

## 2013-05-19 DIAGNOSIS — M549 Dorsalgia, unspecified: Secondary | ICD-10-CM

## 2013-05-23 LAB — CBC
HCT: 44 %
Hgb: 15.1
MCV: 92.6 fL
WBC: 9.3
platelet count: 225

## 2013-05-23 LAB — COMPREHENSIVE METABOLIC PANEL
ALT: 23 U/L (ref 10–40)
AST: 24 U/L
Albumin: 4.3
Alkaline Phosphatase: 70 U/L
BUN: 10 mg/dL (ref 4–21)
Creat: 0.96
TSH: 1.22 u[IU]/mL (ref 0.41–5.90)
Total Bilirubin: 0.4 mg/dL (ref 0.1–1.4)
Total Protein: 5.9 g/dL

## 2013-06-10 ENCOUNTER — Other Ambulatory Visit (HOSPITAL_COMMUNITY): Payer: Self-pay | Admitting: Pulmonary Disease

## 2013-06-10 DIAGNOSIS — M545 Low back pain, unspecified: Secondary | ICD-10-CM

## 2013-06-16 ENCOUNTER — Other Ambulatory Visit (HOSPITAL_COMMUNITY): Payer: Self-pay | Admitting: Pulmonary Disease

## 2013-06-16 ENCOUNTER — Ambulatory Visit (HOSPITAL_COMMUNITY)
Admission: RE | Admit: 2013-06-16 | Discharge: 2013-06-16 | Disposition: A | Discharge status: Home / Self Care | Payer: Medicare Other | Source: Ambulatory Visit | Attending: Pulmonary Disease | Admitting: Pulmonary Disease

## 2013-06-16 DIAGNOSIS — M545 Low back pain, unspecified: Secondary | ICD-10-CM

## 2013-06-16 DIAGNOSIS — M5126 Other intervertebral disc displacement, lumbar region: Secondary | ICD-10-CM | POA: Insufficient documentation

## 2013-06-16 DIAGNOSIS — M47817 Spondylosis without myelopathy or radiculopathy, lumbosacral region: Secondary | ICD-10-CM | POA: Insufficient documentation

## 2013-06-24 ENCOUNTER — Emergency Department (HOSPITAL_COMMUNITY): Payer: Medicare Other

## 2013-06-24 ENCOUNTER — Encounter (HOSPITAL_COMMUNITY): Payer: Self-pay | Admitting: *Deleted

## 2013-06-24 ENCOUNTER — Emergency Department (HOSPITAL_COMMUNITY)
Admission: EM | Admit: 2013-06-24 | Discharge: 2013-06-25 | Disposition: A | Discharge status: Home / Self Care | Payer: Medicare Other | Attending: Emergency Medicine | Admitting: Emergency Medicine

## 2013-06-24 DIAGNOSIS — E785 Hyperlipidemia, unspecified: Secondary | ICD-10-CM | POA: Insufficient documentation

## 2013-06-24 DIAGNOSIS — Z79899 Other long term (current) drug therapy: Secondary | ICD-10-CM | POA: Insufficient documentation

## 2013-06-24 DIAGNOSIS — Z7982 Long term (current) use of aspirin: Secondary | ICD-10-CM | POA: Insufficient documentation

## 2013-06-24 DIAGNOSIS — J449 Chronic obstructive pulmonary disease, unspecified: Secondary | ICD-10-CM

## 2013-06-24 DIAGNOSIS — E663 Overweight: Secondary | ICD-10-CM | POA: Insufficient documentation

## 2013-06-24 DIAGNOSIS — K219 Gastro-esophageal reflux disease without esophagitis: Secondary | ICD-10-CM | POA: Insufficient documentation

## 2013-06-24 DIAGNOSIS — F17200 Nicotine dependence, unspecified, uncomplicated: Secondary | ICD-10-CM | POA: Insufficient documentation

## 2013-06-24 DIAGNOSIS — R05 Cough: Secondary | ICD-10-CM | POA: Insufficient documentation

## 2013-06-24 DIAGNOSIS — R059 Cough, unspecified: Secondary | ICD-10-CM | POA: Insufficient documentation

## 2013-06-24 DIAGNOSIS — J441 Chronic obstructive pulmonary disease with (acute) exacerbation: Secondary | ICD-10-CM | POA: Insufficient documentation

## 2013-06-24 DIAGNOSIS — R079 Chest pain, unspecified: Secondary | ICD-10-CM

## 2013-06-24 DIAGNOSIS — I10 Essential (primary) hypertension: Secondary | ICD-10-CM | POA: Insufficient documentation

## 2013-06-24 DIAGNOSIS — R0789 Other chest pain: Secondary | ICD-10-CM | POA: Insufficient documentation

## 2013-06-24 DIAGNOSIS — Z8619 Personal history of other infectious and parasitic diseases: Secondary | ICD-10-CM | POA: Insufficient documentation

## 2013-06-24 DIAGNOSIS — F411 Generalized anxiety disorder: Secondary | ICD-10-CM | POA: Insufficient documentation

## 2013-06-24 LAB — POCT I-STAT TROPONIN I: Troponin i, poc: 0.01 ng/mL (ref 0.00–0.08)

## 2013-06-24 NOTE — ED Notes (Signed)
Pt c/o chest pain along bottom of chest for months, + SOB and dizziness earlier

## 2013-06-24 NOTE — ED Provider Notes (Signed)
CSN: [id number]     Arrival date & time 06/24/13  2241 History   First MD Initiated Contact with Patient 06/24/13 2348     Chief Complaint  Patient presents with  . Chest Pain    Patient is a 62 y.o. male presenting with chest pain. The history is provided by the patient.  Chest Pain Chest pain location: lower chest  Pain quality: aching   Pain radiates to:  Does not radiate Pain radiates to the back: no   Pain severity:  Moderate Onset quality:  Gradual Duration: "months ago" Timing:  Constant Progression:  Worsening Relieved by:  Certain positions Worsened by:  Certain positions and deep breathing Associated symptoms: cough and shortness of breath   Associated symptoms: no abdominal pain, not vomiting and no weakness   pt reports lower chest wall pain for "months" reports ongoing workup by PCP and specialists without a specific diagnosis.  No injury.  No new fever.  He reports chronic cough/SOB due to his COPD.  No abd pain.  Pain is worse with movement/palpation and deep breathing He reports pain is constant 24/7, every day.  Tonight it seemed worse and he could not sleep and came to the ER He does not have pain meds at home  Past Medical History  Diagnosis Date  . Hypertension   . Hyperlipidemia   . Anxiety   . Gastroesophageal reflux disease     possible small Schatzki's ring; esophageal dilatation in 2005  . COPD (chronic obstructive pulmonary disease)     Chronic bronchitis; 100-pack-year cigarette consumption  . Chest pain 2005    noncritical coronary disease in 2005: 40% distal RCA, 30% CX, 50% OM 2, EF-60%,  . Overweight(278.02)   . Tobacco abuse     100 pack years  . H. pylori infection 2014    treated with prevpac   Past Surgical History  Procedure Laterality Date  . Cervical spine surgery      C5-6 and C6-7: Relief of spinal stenosis with corpectomy, foraminotomy and fusion  . Orif ankle fracture      right  . Appendectomy    . Tonsillectomy    .  Esophagogastroduodenoscopy  09/27/2004    RMR:   A couple of tiny, distal esophageal erosions, otherwise normal esophagus aside from a Schatzki ring status post Maloney dilation as described above/ Small hiatal hernia, otherwise normal stomach, normal D1-D2  . Colonoscopy  10/19/2004    RMR:   Friable anal canal, likely source of patient's hematochezia/  Otherwise normal rectum, normal colon.  . Orif tibia fracture      Left  . Esophagogastroduodenoscopy (egd) with esophageal dilation N/A 03/19/2013    [id number]:[id number] esophagus s/p dilation/Abnormal stomach of uncertain clinical significance-s/p bx positive for H. pylori, status post Prevpac treatment   Family History  Problem Relation Age of Onset  . Heart attack Mother     deceased, age 35s  . Ulcers Brother   . Diabetes Brother   . Colon cancer Neg Hx   . Hypertension     History  Substance Use Topics  . Smoking status: Current Every Day Smoker -- 2.00 packs/day for 50 years    Types: Cigarettes  . Smokeless tobacco: Not on file     Comment: 1 pack per day as of 08/2012  . Alcohol Use: 1.2 oz/week    2 Cans of beer per week     Comment: occasionally, usually holidays but sometimes on weekends, prior heavy use  Review of Systems  Respiratory: Positive for cough and shortness of breath.   Cardiovascular: Positive for chest pain.  Gastrointestinal: Negative for vomiting and abdominal pain.  Neurological: Negative for weakness.  All other systems reviewed and are negative.    Allergies  Tomato  Home Medications   Current Outpatient Rx  Name  Route  Sig  Dispense  Refill  . albuterol (PROVENTIL HFA;VENTOLIN HFA) 108 (90 BASE) MCG/ACT inhaler   Inhalation   Inhale 2 puffs into the lungs every 6 (six) hours as needed. Shortness of Breath         . amLODipine (NORVASC) 5 MG tablet   Oral   Take 1 tablet (5 mg total) by mouth daily.   30 tablet   6   . aspirin EC 81 MG tablet   Oral   Take 81 mg by mouth daily.          Marland Kitchen atenolol (TENORMIN) 50 MG tablet   Oral   Take 50 mg by mouth every morning.          Marland Kitchen atorvastatin (LIPITOR) 20 MG tablet   Oral   Take 20 mg by mouth daily.          Marland Kitchen atorvastatin (LIPITOR) 40 MG tablet   Oral   Take 40 mg by mouth daily.         Marland Kitchen LORazepam (ATIVAN) 1 MG tablet   Oral   Take 1 mg by mouth every 4 (four) hours as needed. For nerves         . nitroGLYCERIN (NITROSTAT) 0.4 MG SL tablet   Sublingual   Place 0.4 mg under the tongue every 5 (five) minutes as needed. Chest Pain         . pantoprazole (PROTONIX) 40 MG tablet   Oral   Take 1 tablet (40 mg total) by mouth daily before breakfast.   30 tablet   11   . polyethylene glycol powder (GLYCOLAX/MIRALAX) powder   Oral   Take 17 g by mouth daily.   255 g      . tiotropium (SPIRIVA) 18 MCG inhalation capsule   Inhalation   Place 18 mcg into inhaler and inhale every morning.          BP 144/83  Pulse 74  Temp(Src) 98.2 F (36.8 C) (Oral)  Resp 18  Ht 5' 6.5" (1.689 m)  Wt 206 lb (93.441 kg)  BMI 32.76 kg/m2  SpO2 96% Physical Exam CONSTITUTIONAL: Well developed/well nourished HEAD: Normocephalic/atraumatic EYES: EOMI/PERRL ENMT: Mucous membranes moist NECK: supple no meningeal signs SPINE:entire spine nontender CV: S1/S2 noted, no murmurs/rubs/gallops noted LUNGS: scattered wheezing noted, no crackles, no distress Chest - tenderness along bilateral lower costal margins.   ABDOMEN: soft, nontender, no rebound or guarding NEURO: Pt is awake/alert, moves all extremitiesx4 EXTREMITIES: pulses normal, full ROM SKIN: warm, color normal PSYCH: no abnormalities of mood noted  ED Course  Procedures  Labs Review Labs Reviewed  BASIC METABOLIC PANEL  CBC WITH DIFFERENTIAL  pt with chronic CP for months.  EKG unchanged.  Doubt ACS/PE/Dissection at this time.  I have asked him to f/u with his PCP for further evaluation.  Short course of pain meds prescribed Pt agreeable with  plan   MDM  No diagnosis found. Nursing notes including past medical history and social history reviewed and considered in documentation xrays reviewed and considered Labs/vital reviewed and considered Narcotic database reviewed   Date: 06/24/2013  Rate: 73  Rhythm: normal sinus  rhythm  QRS Axis: normal  Intervals: normal  ST/T Wave abnormalities: normal  Conduction Disutrbances:none  Narrative Interpretation:   Old EKG Reviewed: unchanged from 09/2012    Joya Gaskins, MD 06/25/13 [phone]

## 2013-06-25 LAB — BASIC METABOLIC PANEL
BUN: 11 mg/dL (ref 6–23)
CO2: 27 meq/L (ref 19–32)
Calcium: 9 mg/dL (ref 8.4–10.5)
Chloride: 101 mEq/L (ref 96–112)
Creatinine, Ser: 1.02 mg/dL (ref 0.50–1.35)
GFR calc Af Amer: 90 mL/min — ABNORMAL LOW (ref >90)
GFR calc non Af Amer: 77 mL/min — ABNORMAL LOW (ref >90)
Glucose, Bld: 95 mg/dL (ref 70–99)
Potassium: 4.1 mEq/L (ref 3.5–5.1)
Sodium: 137 meq/L (ref 135–145)

## 2013-06-25 LAB — CBC WITH DIFFERENTIAL/PLATELET
Basophils Absolute: 0.1 10*3/uL (ref 0.0–0.1)
Basophils Relative: 1 % (ref 0–1)
Eosinophils Absolute: 0.1 10*3/uL (ref 0.0–0.7)
Eosinophils Relative: 1 % (ref 0–5)
HCT: 45.5 % (ref 39.0–52.0)
Hemoglobin: 15.7 g/dL (ref 13.0–17.0)
Lymphocytes Relative: 37 % (ref 12–46)
Lymphs Abs: 3 10*3/uL (ref 0.7–4.0)
MCH: 32.3 pg (ref 26.0–34.0)
MCHC: 34.5 g/dL (ref 30.0–36.0)
MCV: 93.6 fL (ref 78.0–100.0)
Monocytes Absolute: 0.7 10*3/uL (ref 0.1–1.0)
Monocytes Relative: 9 % (ref 3–12)
Neutro Abs: 4.2 10*3/uL (ref 1.7–7.7)
Neutrophils Relative %: 52 % (ref 43–77)
Platelets: 185 10*3/uL (ref 150–400)
RBC: 4.86 MIL/uL (ref 4.22–5.81)
RDW: 14 % (ref 11.5–15.5)
WBC: 8.1 10*3/uL (ref 4.0–10.5)

## 2013-06-25 MED ORDER — MORPHINE SULFATE 4 MG/ML IJ SOLN
4.0000 mg | Freq: Once | INTRAMUSCULAR | Status: AC
  Administered 2013-06-25: 4 mg via INTRAVENOUS
  Filled 2013-06-25: qty 1

## 2013-06-25 MED ORDER — OXYCODONE-ACETAMINOPHEN 5-325 MG PO TABS: 1.0000 | ORAL_TABLET | ORAL | Status: DC | PRN

## 2013-06-25 NOTE — Discharge Instructions (Signed)

## 2013-07-22 ENCOUNTER — Encounter: Payer: Self-pay | Admitting: *Deleted

## 2013-08-01 ENCOUNTER — Other Ambulatory Visit: Payer: Self-pay | Admitting: *Deleted

## 2013-08-01 MED ORDER — AMLODIPINE BESYLATE 5 MG PO TABS: 5.0000 mg | ORAL_TABLET | Freq: Every day | ORAL | Status: DC

## 2013-08-10 ENCOUNTER — Encounter (HOSPITAL_COMMUNITY): Payer: Self-pay | Admitting: Acute Care

## 2013-08-14 ENCOUNTER — Encounter: Payer: Self-pay | Admitting: Internal Medicine

## 2013-10-01 ENCOUNTER — Other Ambulatory Visit: Payer: Self-pay

## 2013-10-01 MED ORDER — POLYETHYLENE GLYCOL 3350 17 GM/SCOOP PO POWD: 17.0000 g | Freq: Every day | ORAL | Status: DC

## 2013-11-02 ENCOUNTER — Emergency Department (HOSPITAL_COMMUNITY): Payer: Medicare Other

## 2013-11-02 ENCOUNTER — Encounter (HOSPITAL_COMMUNITY): Payer: Self-pay | Admitting: Emergency Medicine

## 2013-11-02 ENCOUNTER — Emergency Department (HOSPITAL_COMMUNITY)
Admission: EM | Admit: 2013-11-02 | Discharge: 2013-11-02 | Disposition: A | Discharge status: Home / Self Care | Payer: Medicare Other | Attending: Emergency Medicine | Admitting: Emergency Medicine

## 2013-11-02 DIAGNOSIS — S79919A Unspecified injury of unspecified hip, initial encounter: Secondary | ICD-10-CM | POA: Insufficient documentation

## 2013-11-02 DIAGNOSIS — Z8619 Personal history of other infectious and parasitic diseases: Secondary | ICD-10-CM | POA: Insufficient documentation

## 2013-11-02 DIAGNOSIS — Z79899 Other long term (current) drug therapy: Secondary | ICD-10-CM | POA: Insufficient documentation

## 2013-11-02 DIAGNOSIS — Z7982 Long term (current) use of aspirin: Secondary | ICD-10-CM | POA: Insufficient documentation

## 2013-11-02 DIAGNOSIS — S79929A Unspecified injury of unspecified thigh, initial encounter: Secondary | ICD-10-CM

## 2013-11-02 DIAGNOSIS — I10 Essential (primary) hypertension: Secondary | ICD-10-CM | POA: Insufficient documentation

## 2013-11-02 DIAGNOSIS — F411 Generalized anxiety disorder: Secondary | ICD-10-CM | POA: Insufficient documentation

## 2013-11-02 DIAGNOSIS — Y92410 Unspecified street and highway as the place of occurrence of the external cause: Secondary | ICD-10-CM | POA: Insufficient documentation

## 2013-11-02 DIAGNOSIS — S20229A Contusion of unspecified back wall of thorax, initial encounter: Secondary | ICD-10-CM | POA: Insufficient documentation

## 2013-11-02 DIAGNOSIS — K219 Gastro-esophageal reflux disease without esophagitis: Secondary | ICD-10-CM | POA: Insufficient documentation

## 2013-11-02 DIAGNOSIS — E785 Hyperlipidemia, unspecified: Secondary | ICD-10-CM | POA: Insufficient documentation

## 2013-11-02 DIAGNOSIS — J449 Chronic obstructive pulmonary disease, unspecified: Secondary | ICD-10-CM | POA: Insufficient documentation

## 2013-11-02 DIAGNOSIS — J4489 Other specified chronic obstructive pulmonary disease: Secondary | ICD-10-CM | POA: Insufficient documentation

## 2013-11-02 DIAGNOSIS — T07XXXA Unspecified multiple injuries, initial encounter: Secondary | ICD-10-CM

## 2013-11-02 DIAGNOSIS — E663 Overweight: Secondary | ICD-10-CM | POA: Insufficient documentation

## 2013-11-02 DIAGNOSIS — F17200 Nicotine dependence, unspecified, uncomplicated: Secondary | ICD-10-CM | POA: Insufficient documentation

## 2013-11-02 DIAGNOSIS — Y9389 Activity, other specified: Secondary | ICD-10-CM | POA: Insufficient documentation

## 2013-11-02 DIAGNOSIS — S301XXA Contusion of abdominal wall, initial encounter: Secondary | ICD-10-CM | POA: Insufficient documentation

## 2013-11-02 LAB — CBC WITH DIFFERENTIAL/PLATELET
Basophils Absolute: 0 10*3/uL (ref 0.0–0.1)
Basophils Relative: 1 % (ref 0–1)
Eosinophils Absolute: 0.1 10*3/uL (ref 0.0–0.7)
Eosinophils Relative: 2 % (ref 0–5)
HCT: 42.1 % (ref 39.0–52.0)
Hemoglobin: 14.4 g/dL (ref 13.0–17.0)
Lymphocytes Relative: 26 % (ref 12–46)
Lymphs Abs: 2.1 10*3/uL (ref 0.7–4.0)
MCH: 32.7 pg (ref 26.0–34.0)
MCHC: 34.2 g/dL (ref 30.0–36.0)
MCV: 95.7 fL (ref 78.0–100.0)
Monocytes Absolute: 0.6 10*3/uL (ref 0.1–1.0)
Monocytes Relative: 8 % (ref 3–12)
Neutro Abs: 5 10*3/uL (ref 1.7–7.7)
Neutrophils Relative %: 64 % (ref 43–77)
Platelets: 206 10*3/uL (ref 150–400)
RBC: 4.4 MIL/uL (ref 4.22–5.81)
RDW: 13.1 % (ref 11.5–15.5)
WBC: 7.8 10*3/uL (ref 4.0–10.5)

## 2013-11-02 LAB — URINALYSIS, ROUTINE W REFLEX MICROSCOPIC
Bilirubin Urine: NEGATIVE
Glucose, UA: NEGATIVE mg/dL
Hgb urine dipstick: NEGATIVE
Ketones, ur: NEGATIVE mg/dL
Leukocytes, UA: NEGATIVE
Nitrite: NEGATIVE
Protein, ur: NEGATIVE mg/dL
Specific Gravity, Urine: 1.02 (ref 1.005–1.030)
Urobilinogen, UA: 0.2 mg/dL (ref 0.0–1.0)
pH: 8.5 — ABNORMAL HIGH (ref 5.0–8.0)

## 2013-11-02 LAB — COMPREHENSIVE METABOLIC PANEL
ALT: 17 U/L (ref 0–53)
AST: 22 U/L (ref 0–37)
Albumin: 4 g/dL (ref 3.5–5.2)
Alkaline Phosphatase: 86 U/L (ref 39–117)
BUN: 10 mg/dL (ref 6–23)
CO2: 28 meq/L (ref 19–32)
Calcium: 9 mg/dL (ref 8.4–10.5)
Chloride: 104 mEq/L (ref 96–112)
Creatinine, Ser: 0.94 mg/dL (ref 0.50–1.35)
GFR calc Af Amer: >90 mL/min (ref >90)
GFR calc non Af Amer: 88 mL/min — ABNORMAL LOW (ref >90)
Glucose, Bld: 100 mg/dL — ABNORMAL HIGH (ref 70–99)
Potassium: 4.5 meq/L (ref 3.7–5.3)
Sodium: 141 mEq/L (ref 137–147)
Total Bilirubin: 0.3 mg/dL (ref 0.3–1.2)
Total Protein: 6.9 g/dL (ref 6.0–8.3)

## 2013-11-02 LAB — PROTIME-INR
INR: 0.97 (ref 0.00–1.49)
Prothrombin Time: 12.7 seconds (ref 11.6–15.2)

## 2013-11-02 LAB — TROPONIN I: Troponin I: <0.3 ng/mL (ref <0.30)

## 2013-11-02 MED ORDER — IOHEXOL 300 MG/ML  SOLN
100.0000 mL | Freq: Once | INTRAMUSCULAR | Status: AC | PRN
  Administered 2013-11-02: 100 mL via INTRAVENOUS

## 2013-11-02 MED ORDER — HYDROCODONE-ACETAMINOPHEN 5-325 MG PO TABS: 2.0000 | ORAL_TABLET | ORAL | Status: DC | PRN

## 2013-11-02 MED ORDER — SODIUM CHLORIDE 0.9 % IV SOLN
INTRAVENOUS | Status: DC
  Administered 2013-11-02: 11:00:00 via INTRAVENOUS

## 2013-11-02 NOTE — Discharge Instructions (Signed)
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
## 2013-11-06 ENCOUNTER — Encounter (HOSPITAL_COMMUNITY): Payer: Self-pay | Admitting: Emergency Medicine

## 2013-11-06 ENCOUNTER — Emergency Department (HOSPITAL_COMMUNITY): Payer: Medicare Other

## 2013-11-06 ENCOUNTER — Inpatient Hospital Stay (HOSPITAL_COMMUNITY)
Admission: EM | Admit: 2013-11-06 | Discharge: 2013-11-10 | DRG: 191 | Disposition: A | Discharge status: Home / Self Care | Payer: Medicare Other | Attending: Family Medicine | Admitting: Family Medicine

## 2013-11-06 DIAGNOSIS — J449 Chronic obstructive pulmonary disease, unspecified: Secondary | ICD-10-CM | POA: Diagnosis present

## 2013-11-06 DIAGNOSIS — J441 Chronic obstructive pulmonary disease with (acute) exacerbation: Principal | ICD-10-CM | POA: Diagnosis present

## 2013-11-06 DIAGNOSIS — I10 Essential (primary) hypertension: Secondary | ICD-10-CM | POA: Diagnosis present

## 2013-11-06 DIAGNOSIS — Z8249 Family history of ischemic heart disease and other diseases of the circulatory system: Secondary | ICD-10-CM

## 2013-11-06 DIAGNOSIS — Z7982 Long term (current) use of aspirin: Secondary | ICD-10-CM

## 2013-11-06 DIAGNOSIS — J189 Pneumonia, unspecified organism: Secondary | ICD-10-CM

## 2013-11-06 DIAGNOSIS — R0602 Shortness of breath: Secondary | ICD-10-CM | POA: Diagnosis not present

## 2013-11-06 DIAGNOSIS — E785 Hyperlipidemia, unspecified: Secondary | ICD-10-CM | POA: Diagnosis present

## 2013-11-06 DIAGNOSIS — F411 Generalized anxiety disorder: Secondary | ICD-10-CM | POA: Diagnosis present

## 2013-11-06 DIAGNOSIS — Z79899 Other long term (current) drug therapy: Secondary | ICD-10-CM

## 2013-11-06 DIAGNOSIS — F17200 Nicotine dependence, unspecified, uncomplicated: Secondary | ICD-10-CM | POA: Diagnosis present

## 2013-11-06 DIAGNOSIS — J45901 Unspecified asthma with (acute) exacerbation: Principal | ICD-10-CM

## 2013-11-06 DIAGNOSIS — I2510 Atherosclerotic heart disease of native coronary artery without angina pectoris: Secondary | ICD-10-CM | POA: Diagnosis present

## 2013-11-06 DIAGNOSIS — Z833 Family history of diabetes mellitus: Secondary | ICD-10-CM

## 2013-11-06 DIAGNOSIS — J111 Influenza due to unidentified influenza virus with other respiratory manifestations: Secondary | ICD-10-CM | POA: Diagnosis present

## 2013-11-06 DIAGNOSIS — J9610 Chronic respiratory failure, unspecified whether with hypoxia or hypercapnia: Secondary | ICD-10-CM | POA: Diagnosis present

## 2013-11-06 DIAGNOSIS — R0902 Hypoxemia: Secondary | ICD-10-CM

## 2013-11-06 DIAGNOSIS — K219 Gastro-esophageal reflux disease without esophagitis: Secondary | ICD-10-CM | POA: Diagnosis present

## 2013-11-06 DIAGNOSIS — J1100 Influenza due to unidentified influenza virus with unspecified type of pneumonia: Secondary | ICD-10-CM | POA: Diagnosis present

## 2013-11-06 HISTORY — DX: Angina pectoris, unspecified: I20.9

## 2013-11-06 LAB — INFLUENZA PANEL BY PCR (TYPE A & B)
H1N1 flu by pcr: NOT DETECTED
Influenza A By PCR: POSITIVE — AB
Influenza B By PCR: NEGATIVE

## 2013-11-06 LAB — CBC
HCT: 40.7 % (ref 39.0–52.0)
Hemoglobin: 13.9 g/dL (ref 13.0–17.0)
MCH: 32.3 pg (ref 26.0–34.0)
MCHC: 34.2 g/dL (ref 30.0–36.0)
MCV: 94.4 fL (ref 78.0–100.0)
Platelets: 186 10*3/uL (ref 150–400)
RBC: 4.31 MIL/uL (ref 4.22–5.81)
RDW: 13.3 % (ref 11.5–15.5)
WBC: 8.9 10*3/uL (ref 4.0–10.5)

## 2013-11-06 LAB — BASIC METABOLIC PANEL
BUN: 16 mg/dL (ref 6–23)
CO2: 22 meq/L (ref 19–32)
Calcium: 8.7 mg/dL (ref 8.4–10.5)
Chloride: 99 mEq/L (ref 96–112)
Creatinine, Ser: 1.09 mg/dL (ref 0.50–1.35)
GFR calc Af Amer: 83 mL/min — ABNORMAL LOW (ref >90)
GFR calc non Af Amer: 71 mL/min — ABNORMAL LOW (ref >90)
Glucose, Bld: 103 mg/dL — ABNORMAL HIGH (ref 70–99)
Potassium: 4.2 meq/L (ref 3.7–5.3)
Sodium: 135 meq/L — ABNORMAL LOW (ref 137–147)

## 2013-11-06 LAB — TROPONIN I: Troponin I: <0.3 ng/mL (ref <0.30)

## 2013-11-06 MED ORDER — AMLODIPINE BESYLATE 5 MG PO TABS
5.0000 mg | ORAL_TABLET | Freq: Every day | ORAL | Status: DC
  Administered 2013-11-07 – 2013-11-09 (×3): 5 mg via ORAL
  Filled 2013-11-06 (×3): qty 1

## 2013-11-06 MED ORDER — ACETAMINOPHEN 500 MG PO TABS
1000.0000 mg | ORAL_TABLET | Freq: Once | ORAL | Status: AC
  Administered 2013-11-06: 1000 mg via ORAL

## 2013-11-06 MED ORDER — NITROGLYCERIN 0.4 MG SL SUBL: 0.4000 mg | SUBLINGUAL_TABLET | SUBLINGUAL | Status: DC | PRN

## 2013-11-06 MED ORDER — ALBUTEROL SULFATE (2.5 MG/3ML) 0.083% IN NEBU: 2.5000 mg | INHALATION_SOLUTION | RESPIRATORY_TRACT | Status: DC | PRN

## 2013-11-06 MED ORDER — ATENOLOL 25 MG PO TABS
50.0000 mg | ORAL_TABLET | Freq: Every day | ORAL | Status: DC
  Administered 2013-11-07 – 2013-11-09 (×3): 50 mg via ORAL
  Filled 2013-11-06 (×3): qty 2

## 2013-11-06 MED ORDER — LORAZEPAM 1 MG PO TABS
1.0000 mg | ORAL_TABLET | ORAL | Status: DC | PRN
  Administered 2013-11-06 – 2013-11-09 (×7): 1 mg via ORAL
  Filled 2013-11-06 (×7): qty 1

## 2013-11-06 MED ORDER — LEVOFLOXACIN IN D5W 500 MG/100ML IV SOLN
500.0000 mg | Freq: Once | INTRAVENOUS | Status: AC
  Administered 2013-11-06: 500 mg via INTRAVENOUS
  Filled 2013-11-06: qty 100

## 2013-11-06 MED ORDER — ALBUTEROL (5 MG/ML) CONTINUOUS INHALATION SOLN
10.0000 mg/h | INHALATION_SOLUTION | Freq: Once | RESPIRATORY_TRACT | Status: AC
  Administered 2013-11-06: 10 mg/h via RESPIRATORY_TRACT
  Filled 2013-11-06: qty 20

## 2013-11-06 MED ORDER — LEVOFLOXACIN IN D5W 500 MG/100ML IV SOLN
500.0000 mg | INTRAVENOUS | Status: DC
  Administered 2013-11-07 – 2013-11-08 (×2): 500 mg via INTRAVENOUS
  Filled 2013-11-06 (×3): qty 100

## 2013-11-06 MED ORDER — SODIUM CHLORIDE 0.45 % IV SOLN
INTRAVENOUS | Status: DC
  Administered 2013-11-06 – 2013-11-09 (×6): via INTRAVENOUS

## 2013-11-06 MED ORDER — ASPIRIN EC 81 MG PO TBEC
81.0000 mg | DELAYED_RELEASE_TABLET | Freq: Every day | ORAL | Status: DC
  Administered 2013-11-07 – 2013-11-09 (×3): 81 mg via ORAL
  Filled 2013-11-06 (×3): qty 1

## 2013-11-06 MED ORDER — ACETAMINOPHEN 500 MG PO TABS
ORAL_TABLET | ORAL | Status: AC
  Filled 2013-11-06: qty 2

## 2013-11-06 MED ORDER — PANTOPRAZOLE SODIUM 40 MG PO TBEC
40.0000 mg | DELAYED_RELEASE_TABLET | Freq: Every day | ORAL | Status: DC
  Administered 2013-11-07 – 2013-11-09 (×3): 40 mg via ORAL
  Filled 2013-11-06 (×3): qty 1

## 2013-11-06 MED ORDER — POLYETHYLENE GLYCOL 3350 17 G PO PACK
17.0000 g | PACK | Freq: Every day | ORAL | Status: DC
  Administered 2013-11-07 – 2013-11-09 (×3): 17 g via ORAL
  Filled 2013-11-06 (×3): qty 1

## 2013-11-06 MED ORDER — POLYETHYLENE GLYCOL 3350 17 GM/SCOOP PO POWD: 17.0000 g | Freq: Every day | ORAL | Status: DC

## 2013-11-06 MED ORDER — ATORVASTATIN CALCIUM 20 MG PO TABS
60.0000 mg | ORAL_TABLET | Freq: Every day | ORAL | Status: DC
  Administered 2013-11-07 – 2013-11-09 (×3): 60 mg via ORAL
  Filled 2013-11-06 (×3): qty 3

## 2013-11-06 MED ORDER — IPRATROPIUM BROMIDE 0.02 % IN SOLN
0.5000 mg | Freq: Once | RESPIRATORY_TRACT | Status: AC
  Administered 2013-11-06: 0.5 mg via RESPIRATORY_TRACT
  Filled 2013-11-06: qty 2.5

## 2013-11-06 MED ORDER — ALBUTEROL SULFATE (2.5 MG/3ML) 0.083% IN NEBU
2.5000 mg | INHALATION_SOLUTION | Freq: Three times a day (TID) | RESPIRATORY_TRACT | Status: DC
  Administered 2013-11-07 – 2013-11-10 (×10): 2.5 mg via RESPIRATORY_TRACT
  Filled 2013-11-06 (×10): qty 3

## 2013-11-06 MED ORDER — OSELTAMIVIR PHOSPHATE 75 MG PO CAPS
75.0000 mg | ORAL_CAPSULE | Freq: Two times a day (BID) | ORAL | Status: DC
  Administered 2013-11-06 – 2013-11-09 (×7): 75 mg via ORAL
  Filled 2013-11-06 (×7): qty 1

## 2013-11-06 MED ORDER — HYDROCODONE-ACETAMINOPHEN 5-325 MG PO TABS
2.0000 | ORAL_TABLET | ORAL | Status: DC | PRN
  Administered 2013-11-06 – 2013-11-09 (×7): 2 via ORAL
  Filled 2013-11-06 (×7): qty 2

## 2013-11-06 MED ORDER — ALBUTEROL SULFATE (2.5 MG/3ML) 0.083% IN NEBU
2.5000 mg | INHALATION_SOLUTION | Freq: Four times a day (QID) | RESPIRATORY_TRACT | Status: DC | PRN
  Administered 2013-11-06: 2.5 mg via RESPIRATORY_TRACT
  Filled 2013-11-06: qty 3

## 2013-11-06 MED ORDER — ENOXAPARIN SODIUM 40 MG/0.4ML ~~LOC~~ SOLN
40.0000 mg | SUBCUTANEOUS | Status: DC
  Administered 2013-11-06 – 2013-11-09 (×4): 40 mg via SUBCUTANEOUS
  Filled 2013-11-06 (×4): qty 0.4

## 2013-11-06 MED ORDER — METHYLPREDNISOLONE SODIUM SUCC 125 MG IJ SOLR
125.0000 mg | Freq: Two times a day (BID) | INTRAMUSCULAR | Status: DC
  Administered 2013-11-06 – 2013-11-09 (×7): 125 mg via INTRAVENOUS
  Filled 2013-11-06 (×7): qty 2

## 2013-11-06 MED ORDER — LEVOFLOXACIN IN D5W 500 MG/100ML IV SOLN: 500.0000 mg | Freq: Once | INTRAVENOUS | Status: DC

## 2013-11-06 MED ORDER — TIOTROPIUM BROMIDE MONOHYDRATE 18 MCG IN CAPS
18.0000 ug | ORAL_CAPSULE | Freq: Every day | RESPIRATORY_TRACT | Status: DC
  Administered 2013-11-07 – 2013-11-10 (×4): 18 ug via RESPIRATORY_TRACT
  Filled 2013-11-06: qty 5

## 2013-11-06 MED ORDER — METHYLPREDNISOLONE SODIUM SUCC 125 MG IJ SOLR
125.0000 mg | Freq: Once | INTRAMUSCULAR | Status: AC
  Administered 2013-11-06: 125 mg via INTRAVENOUS
  Filled 2013-11-06: qty 2

## 2013-11-06 MED ORDER — SODIUM CHLORIDE 0.9 % IV BOLUS (SEPSIS)
1000.0000 mL | Freq: Once | INTRAVENOUS | Status: AC
  Administered 2013-11-06: 1000 mL via INTRAVENOUS

## 2013-11-06 NOTE — ED Provider Notes (Addendum)
CSN: [id number]     Arrival date & time 11/06/13  1301 History  This chart was scribed for Varney Biles, MD by Roxan Diesel, ED scribe.  This patient was seen in room APA12/APA12 and the patient's care was started at 2:28 PM.   Chief Complaint  Patient presents with  . Cough    The history is provided by the patient. No language interpreter was used.    HPI Comments: Trevor Berg is a 63 y.o. male with h/o COPD and asthma who presents to the Emergency Department complaining of cough with associated SOB that began last night.  Pt states cough is occasionally productive of milky sputum.   SOB is worsened by coughing.   He also complains of associated generalized body aches, generalized weakness, lightheadedness, congestion, rhinorrhea, and fever.  ED temperature is 101.5.  He states his throat is "dry" but denies sore throat.  He states that when he walks between rooms he is "staggering, like I'm drunk."  He normally is able to walk without difficulty.  Pt is unsure whether his SOB is related to his COPD.  He uses an inhaler occasionally at baseline but has had increased usage recently.  He has used it "a bunch" today with minimal temporary relief.  Pt also takes nitroglycerin for chest pain related to noncritical coronary artery disease.  He denies h/o MI or CHF.  He denies h/o DVT/PE.   Past Medical History  Diagnosis Date  . Hypertension   . Hyperlipidemia   . Anxiety   . Gastroesophageal reflux disease     possible small Schatzki's ring; esophageal dilatation in 2005  . COPD (chronic obstructive pulmonary disease)     Chronic bronchitis; 100-pack-year cigarette consumption  . Chest pain 2005    noncritical coronary disease in 2005: 40% distal RCA, 30% CX, 50% OM 2, EF-60%,  . Overweight(278.02)   . Tobacco abuse     100 pack years  . H. pylori infection 2014    treated with prevpac    Past Surgical History  Procedure Laterality Date  . Cervical spine surgery      C5-6 and  C6-7: Relief of spinal stenosis with corpectomy, foraminotomy and fusion  . Orif ankle fracture      right  . Tonsillectomy    . Esophagogastroduodenoscopy  09/27/2004    RMR:   A couple of tiny, distal esophageal erosions, otherwise normal esophagus aside from a Schatzki ring status post Maloney dilation as described above/ Small hiatal hernia, otherwise normal stomach, normal D1-D2  . Colonoscopy  10/19/2004    RMR:   Friable anal canal, likely source of patient's hematochezia/  Otherwise normal rectum, normal colon.  . Orif tibia fracture      Left  . Esophagogastroduodenoscopy (egd) with esophageal dilation N/A 03/19/2013    [facility]:NFAOZH esophagus s/p dilation/Abnormal stomach of uncertain clinical significance-s/p bx positive for H. pylori, status post Prevpac treatment    Family History  Problem Relation Age of Onset  . Heart attack Mother     deceased, age 32s  . Ulcers Brother   . Diabetes Brother   . Colon cancer Neg Hx   . Hypertension      History  Substance Use Topics  . Smoking status: Current Every Day Smoker -- 2.00 packs/day for 50 years    Types: Cigarettes  . Smokeless tobacco: Not on file     Comment: 1 pack per day as of 08/2012  . Alcohol Use: No  Comment: occasionally, usually holidays but sometimes on weekends, prior heavy use     Review of Systems  Constitutional: Positive for fever.  HENT: Positive for congestion and rhinorrhea. Negative for sore throat.   Respiratory: Positive for cough and shortness of breath.   Musculoskeletal: Positive for myalgias.  Neurological: Positive for weakness and light-headedness.     Allergies  Tomato  Home Medications   Current Outpatient Rx  Name  Route  Sig  Dispense  Refill  . albuterol (PROVENTIL HFA;VENTOLIN HFA) 108 (90 BASE) MCG/ACT inhaler   Inhalation   Inhale 2 puffs into the lungs every 6 (six) hours as needed. Shortness of Breath         . amLODipine (NORVASC) 5 MG tablet   Oral   Take 1  tablet (5 mg total) by mouth daily.   30 tablet   3     Please call and make an appointment with our offic ...   . aspirin EC 81 MG tablet   Oral   Take 81 mg by mouth daily.         Marland Kitchen atenolol (TENORMIN) 50 MG tablet   Oral   Take 50 mg by mouth every morning.          Marland Kitchen atorvastatin (LIPITOR) 20 MG tablet   Oral   Take 20 mg by mouth daily. Takes with a 40 mg tablet to equal dose of 60 mg         . atorvastatin (LIPITOR) 40 MG tablet   Oral   Take 40 mg by mouth daily. Takes with a 20 mg tablet to equal dose of 60 mg daily         . HYDROcodone-acetaminophen (NORCO/VICODIN) 5-325 MG per tablet   Oral   Take 2 tablets by mouth every 4 (four) hours as needed for moderate pain.   20 tablet   0   . LORazepam (ATIVAN) 1 MG tablet   Oral   Take 1 mg by mouth every 4 (four) hours as needed. For nerves         . nitroGLYCERIN (NITROSTAT) 0.4 MG SL tablet   Sublingual   Place 0.4 mg under the tongue every 5 (five) minutes as needed. Chest Pain         . pantoprazole (PROTONIX) 40 MG tablet   Oral   Take 1 tablet (40 mg total) by mouth daily before breakfast.   30 tablet   11   . polyethylene glycol powder (GLYCOLAX/MIRALAX) powder   Oral   Take 17 g by mouth daily.   255 g   3   . Pseudoephedrine-Guaifenesin (MUCINEX D) [phone] MG TB12   Oral   Take 1 tablet by mouth every 12 (twelve) hours.         Marland Kitchen tiotropium (SPIRIVA) 18 MCG inhalation capsule   Inhalation   Place 18 mcg into inhaler and inhale every morning.          BP 119/62  Pulse 94  Temp(Src) 101.5 F (38.6 C) (Oral)  Resp 20  Ht 5\' 6"  (1.676 m)  Wt 215 lb (97.523 kg)  BMI 34.72 kg/m2  SpO2 87%  Physical Exam  Nursing note and vitals reviewed. Constitutional: He is oriented to person, place, and time. He appears well-developed and well-nourished. No distress.  HENT:  Head: Normocephalic and atraumatic.  Eyes: EOM are normal.  Neck: Neck supple. No tracheal deviation present.   Cardiovascular: Normal rate, regular rhythm and normal heart sounds.  No murmur heard. Pulmonary/Chest: Effort normal. No respiratory distress. He has wheezes (fine wheezing with expiration only). He has no rhonchi. He has no rales.  Musculoskeletal: Normal range of motion.  No leg swelling, no pitting edema  Neurological: He is alert and oriented to person, place, and time.  Skin: Skin is warm and dry.  Psychiatric: He has a normal mood and affect. His behavior is normal.    ED Course  Procedures (including critical care time)  DIAGNOSTIC STUDIES: Oxygen Saturation is 87% on room air, low by my interpretation.    COORDINATION OF CARE: 2:36 PM-Discussed treatment plan which includes CXR, Tylenol, and labs with pt at bedside and pt agreed to plan.    Labs Review Labs Reviewed  CBC  BASIC METABOLIC PANEL  TROPONIN I    Imaging Review Dg Chest Port 1 View  11/06/2013   CLINICAL DATA:  Cough, congestion, chest pain  EXAM: PORTABLE CHEST - 1 VIEW  COMPARISON:  06/25/2013  FINDINGS: The heart size and mediastinal contours are within normal limits. Both lungs are clear. The visualized skeletal structures are unremarkable.  IMPRESSION: No active disease.   Electronically Signed   By: Margaree Mackintosh M.D.   On: 11/06/2013 14:19    EKG Interpretation    Date/Time:  Thursday November 06 2013 15:13:03 EST Ventricular Rate:  86 PR Interval:  162 QRS Duration: 88 QT Interval:  368 QTC Calculation: 440 R Axis:   36 Text Interpretation:  Normal sinus rhythm Normal ECG When compared with ECG of 02-Nov-2013 10:17, Incomplete right bundle branch block is no longer Present Confirmed by Kathrynn Humble, MD, Dona Walby ([id number]) on 11/06/2013 5:40:34 PM            MDM  No diagnosis found.  I personally performed the services described in this documentation, which was scribed in my presence. The recorded information has been reviewed and is accurate.   Pt comes in with cc of cough and dib. No CAD,  CHF hx, + COPD hx. Sx started last night, and has a productive cough - with fever. + wheezing on exam, hypoxic on room air and febrile.  ? Flu, ? COPD exacerbation with viral syndrome, ? CAP with COPD exacerbation. Pt has never reqd O2, and is dizzy with ambulation - wo will likely need to optimized and possibly evaluated for chronic O2 use.    Varney Biles, MD 11/06/13 [place], MD 11/06/13 1740

## 2013-11-06 NOTE — ED Notes (Signed)
Attempted to ambulate pt with pulseox. Pt states he was too dizzy to walk. Pt stood and sat back down. Pt was also dizzy on arrival to ED.

## 2013-11-06 NOTE — ED Notes (Signed)
Report given to floor. Pt and wife are both currently eating dinner. Will transport pt as soon as they are done eating. Receiving nurse is aware.

## 2013-11-06 NOTE — ED Notes (Signed)
Pt and wife have finished eating. Tech is currently ambulating patient with pulse ox as ordered earlier. Pt to be transported to floor afterwards.
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
## 2013-11-07 DIAGNOSIS — I2510 Atherosclerotic heart disease of native coronary artery without angina pectoris: Secondary | ICD-10-CM | POA: Diagnosis present

## 2013-11-07 DIAGNOSIS — F411 Generalized anxiety disorder: Secondary | ICD-10-CM | POA: Diagnosis present

## 2013-11-07 DIAGNOSIS — J441 Chronic obstructive pulmonary disease with (acute) exacerbation: Secondary | ICD-10-CM | POA: Diagnosis present

## 2013-11-07 DIAGNOSIS — K219 Gastro-esophageal reflux disease without esophagitis: Secondary | ICD-10-CM | POA: Diagnosis present

## 2013-11-07 DIAGNOSIS — R0602 Shortness of breath: Secondary | ICD-10-CM | POA: Diagnosis present

## 2013-11-07 DIAGNOSIS — E785 Hyperlipidemia, unspecified: Secondary | ICD-10-CM | POA: Diagnosis present

## 2013-11-07 DIAGNOSIS — J449 Chronic obstructive pulmonary disease, unspecified: Secondary | ICD-10-CM | POA: Diagnosis present

## 2013-11-07 DIAGNOSIS — I10 Essential (primary) hypertension: Secondary | ICD-10-CM | POA: Diagnosis present

## 2013-11-07 DIAGNOSIS — Z7982 Long term (current) use of aspirin: Secondary | ICD-10-CM | POA: Diagnosis not present

## 2013-11-07 DIAGNOSIS — Z8249 Family history of ischemic heart disease and other diseases of the circulatory system: Secondary | ICD-10-CM | POA: Diagnosis not present

## 2013-11-07 DIAGNOSIS — J111 Influenza due to unidentified influenza virus with other respiratory manifestations: Secondary | ICD-10-CM | POA: Diagnosis present

## 2013-11-07 DIAGNOSIS — Z833 Family history of diabetes mellitus: Secondary | ICD-10-CM | POA: Diagnosis not present

## 2013-11-07 DIAGNOSIS — Z79899 Other long term (current) drug therapy: Secondary | ICD-10-CM | POA: Diagnosis not present

## 2013-11-07 DIAGNOSIS — F17200 Nicotine dependence, unspecified, uncomplicated: Secondary | ICD-10-CM | POA: Diagnosis present

## 2013-11-07 DIAGNOSIS — J9610 Chronic respiratory failure, unspecified whether with hypoxia or hypercapnia: Secondary | ICD-10-CM | POA: Diagnosis present

## 2013-11-07 DIAGNOSIS — J45901 Unspecified asthma with (acute) exacerbation: Secondary | ICD-10-CM | POA: Diagnosis present

## 2013-11-07 MED ORDER — GUAIFENESIN ER 600 MG PO TB12
1200.0000 mg | ORAL_TABLET | Freq: Two times a day (BID) | ORAL | Status: DC
  Administered 2013-11-07 – 2013-11-09 (×6): 1200 mg via ORAL
  Filled 2013-11-07 (×6): qty 2

## 2013-11-07 NOTE — Care Management Note (Addendum)
    Page 1 of 1   11/10/2013     8:53:22 AM   CARE MANAGEMENT NOTE 11/10/2013  Patient:  Trevor Berg,Trevor Berg   Account Number:  [id number]  Date Initiated:  11/07/2013  Documentation initiated by:  Theophilus Kinds  Subjective/Objective Assessment:   Pt admitted from home with COPD. Pt lives with his wife and son. Pt is independent with ADL's.     Action/Plan:   No CM needs noted.   Anticipated DC Date:  11/09/2013   Anticipated DC Plan:  [place]  CM consult      Choice offered to / List presented to:             Status of service:  Completed, signed off Medicare Important Message given?  YES (If response is "NO", the following Medicare IM given date fields will be blank) Date Medicare IM given:  11/10/2013 Date Additional Medicare IM given:    Discharge Disposition:  HOME/SELF CARE  Per UR Regulation:    If discussed at Long Length of Stay Meetings, dates discussed:    Comments:  11/10/13 [place], RN BSN CM Pt discharged home today. No CM needs noted.  11/07/13 [place], RN BSN CM

## 2013-11-07 NOTE — Progress Notes (Signed)
Trevor Berg, Trevor Berg                 ACCOUNT NO.:  [id number]  MEDICAL RECORD NO.:  [id number]  LOCATION:  A311                          FACILITY:  [facility]  PHYSICIAN:  Alixandra Alfieri G. Everette Rank, MD   DATE OF BIRTH:  20-Apr-1951  DATE OF PROCEDURE: DATE OF DISCHARGE:                                PROGRESS NOTE   SUBJECTIVE:  This patient continues to cough intermittently and remains congested.  He is not as dizzy as he was.  He remains afebrile.  He did test positive for flu.  OBJECTIVE:  GENERAL:  Alert male. VITAL SIGNS:  Blood pressure 123/68, respirations 20, pulse 91, temperature 97.1.  HEENT:  Negative. NECK:  Supple.  No JVD or thyroid abnormalities. HEART:  Regular rate and rhythm.  No murmurs. LUNGS:  Occasional rhonchus heard over lower lung fields. ABDOMEN:  No palpable organs or masses.  ASSESSMENT:  The patient was admitted with chronic obstructive pulmonary disease.  She had exacerbation of flu syndrome bronchitis, hypoxia but is now 95% O2 saturations.  PLAN:  To continue medications.  Tamiflu was started 75 mg b.i.d. Continue Levaquin IV 500 mg.     Urania Pearlman G. Everette Rank, MD     AGM/MEDQ  D:  11/07/2013  T:  11/07/2013  Job:  [id number]

## 2013-11-07 NOTE — H&P (Signed)
[name], [name]                 ACCOUNT NO.:  [id number]  MEDICAL RECORD NO.:  [id number]  LOCATION:  A311                          FACILITY:  [facility]  PHYSICIAN:  Shrihan Putt G. Everette Rank, MD   DATE OF BIRTH:  Apr 27, 1951  DATE OF ADMISSION:  11/06/2013 DATE OF DISCHARGE:  LH                             HISTORY & PHYSICAL   HISTORY OF PRESENT ILLNESS:  A 63 year old male admitted with chief complaint of shortness of breath.  This 63 year old  was admitted through the emergency department having been seen by emergency room physician.  The patient was coughing and short of breath.  Apparently, this began within the last 24 hour.  He does have a history of shortness of breath and COPD.  He was found to be somewhat hypoxic in the emergency department with a PO2 of 87% and with given oxygen it was 94%. X-rays were obtained did not show evidence of pneumonia.  It was felt he should be admitted to med surgical floor for further evaluation.  He was started on IV Levaquin, steroids and antibiotics were to be continued along with nebulizer treatments.  PAST MEDICAL HISTORY:  Prior history of hypertension, hyperlipidemia, chronic anxiety, gastroesophageal reflux disease, COPD, noncritical coronary artery disease, tobacco abuse, H. pylori infection.  PAST SURGICAL HISTORY:  Surgery on cervical spine, C5-6 and C6-7, tonsillectomy, right ankle fracture, fracture left tibia.  EGD with a finding of esophageal erosions.  Schatzki's ring.  Small hiatal hernia.  FAMILY HISTORY:  Heart attack in mother.  Diabetes in brother.  Colon cancer, negative.  SOCIAL HISTORY:  The patient continues to smoke cigarettes, do not use alcohol.  ALLERGIES:  TOMATOES and no drugs.  REVIEW OF SYSTEMS:  HEENT:  Negative.  CARDIOPULMONARY:  Cough, wheezing and dyspnea with exertion.  GI:  No vomiting, diarrhea.  GU:  No dysuria, hematuria.  PHYSICAL EXAMINATION:  GENERAL:  Alert male in no acute distress. VITAL SIGNS:   Blood pressure 125/74, respirations 20, pulse 94, temperature 98.2.  HEENT:  Eyes, PERRLA.  TM, negative.  Oropharynx, benign. NECK:  Supple.  No JVD or thyroid abnormalities. HEART:  Regular rhythm.  No murmurs.  No cardiomegaly. LUNGS:  Occasional rhonchus heard in both lung fields. ABDOMEN:  No palpable organs or masses.  No organomegaly. EXTREMITIES:  Free of edema. NEUROLOGIC:  Cranial nerves intact.  No motor or sensory abnormalities. SKIN:  Warm and dry.  ASSESSMENT:  The patient does have a history of chronic obstructive pulmonary disease, hypoxia.  PLAN:  To continue nasal O2, continue daily IV antibiotic, neb treatments and steroids.  We will obtain Pulmonary consult.  MEDICATION LIST: 1. Norvasc 5 mg daily. 2. Proventil inhaler 2 puffs every 6 hours as needed for shortness of     breath. 3. Aspirin 81 mg daily. 4. Tenormin 50 mg daily. 5. Lipitor 20 mg daily. 6. Lipitor 40 mg daily. 7. Norco 5/325, 1-2 tablets every 4 hours as needed for moderate pain. 8. Ativan 1 mg every 4 hours as needed for nerves. 9. Nitrostat 0.4 mg sublingual as needed for chest pain. 10.Protonix 40 mg daily. 11.Guaifenesin 1 tab every 12 hours. 12.Spiriva HandiHaler 18 mcg 1  inhalation daily.     Nirvi Boehler G. Everette Rank, MD     AGM/MEDQ  D:  11/06/2013  T:  11/07/2013  Job:  [id number]

## 2013-11-07 NOTE — Progress Notes (Signed)
UR completed. Patient changed to inpatient r/t requiring IV antibiotics, IVF @ 75cc/hr, and IV solumedrol

## 2013-11-08 NOTE — Progress Notes (Signed)
Subjective: He says he feels better. He has no new complaints. His breathing is better and is able to cough up a little bit of sputum  Objective: Vital signs in last 24 hours: Temp:  [97.4 F (36.3 C)-98.5 F (36.9 C)] 97.6 F (36.4 C) (01/10 0630) Pulse Rate:  [78-85] 85 (01/10 0630) Resp:  [19-20] 20 (01/10 0630) BP: (110-120)/(64-74) 120/74 mmHg (01/10 0630) SpO2:  [90 %-96 %] 95 % (01/10 0700) Weight change:  Last BM Date: 11/07/13  Intake/Output from previous day: 01/09 0701 - 01/10 0700 In: 3101.3 [P.O.:720; I.V.:2281.3; IV Piggyback:100] Out: 1175 [Urine:1175]  PHYSICAL EXAM General appearance: alert, cooperative and no distress Resp: rhonchi bilaterally Cardio: regular rate and rhythm, S1, S2 normal, no murmur, click, rub or gallop GI: soft, non-tender; bowel sounds normal; no masses,  no organomegaly Extremities: extremities normal, atraumatic, no cyanosis or edema  Lab Results:    Basic Metabolic Panel:  Recent Labs  11/06/13 1431  NA 135*  K 4.2  CL 99  CO2 22  GLUCOSE 103*  BUN 16  CREATININE 1.09  CALCIUM 8.7   Liver Function Tests: No results found for this basename: AST, ALT, ALKPHOS, BILITOT, PROT, ALBUMIN,  in the last 72 hours No results found for this basename: LIPASE, AMYLASE,  in the last 72 hours No results found for this basename: AMMONIA,  in the last 72 hours CBC:  Recent Labs  11/06/13 1431  WBC 8.9  HGB 13.9  HCT 40.7  MCV 94.4  PLT 186   Cardiac Enzymes:  Recent Labs  11/06/13 1431  TROPONINI <0.30   BNP: No results found for this basename: PROBNP,  in the last 72 hours D-Dimer: No results found for this basename: DDIMER,  in the last 72 hours CBG: No results found for this basename: GLUCAP,  in the last 72 hours Hemoglobin A1C: No results found for this basename: HGBA1C,  in the last 72 hours Fasting Lipid Panel: No results found for this basename: CHOL, HDL, LDLCALC, TRIG, CHOLHDL, LDLDIRECT,  in the last 72  hours Thyroid Function Tests: No results found for this basename: TSH, T4TOTAL, FREET4, T3FREE, THYROIDAB,  in the last 72 hours Anemia Panel: No results found for this basename: VITAMINB12, FOLATE, FERRITIN, TIBC, IRON, RETICCTPCT,  in the last 72 hours Coagulation: No results found for this basename: LABPROT, INR,  in the last 72 hours Urine Drug Screen: Drugs of Abuse  No results found for this basename: labopia, cocainscrnur, labbenz, amphetmu, thcu, labbarb    Alcohol Level: No results found for this basename: ETH,  in the last 72 hours Urinalysis: No results found for this basename: COLORURINE, APPERANCEUR, LABSPEC, PHURINE, GLUCOSEU, HGBUR, BILIRUBINUR, KETONESUR, PROTEINUR, UROBILINOGEN, NITRITE, LEUKOCYTESUR,  in the last 72 hours Misc. Labs:  ABGS No results found for this basename: PHART, PCO2, PO2ART, TCO2, HCO3,  in the last 72 hours CULTURES No results found for this or any previous visit (from the past 240 hour(s)). Studies/Results: Dg Chest Port 1 View  11/06/2013   CLINICAL DATA:  Cough, congestion, chest pain  EXAM: PORTABLE CHEST - 1 VIEW  COMPARISON:  06/25/2013  FINDINGS: The heart size and mediastinal contours are within normal limits. Both lungs are clear. The visualized skeletal structures are unremarkable.  IMPRESSION: No active disease.   Electronically Signed   By: Margaree Mackintosh M.D.   On: 11/06/2013 14:19    Medications:  Scheduled: . albuterol  2.5 mg Nebulization TID  . amLODipine  5 mg Oral Daily  .  aspirin EC  81 mg Oral Daily  . atenolol  50 mg Oral Daily  . atorvastatin  60 mg Oral q1800  . enoxaparin (LOVENOX) injection  40 mg Subcutaneous Q24H  . guaiFENesin  1,200 mg Oral BID  . levofloxacin (LEVAQUIN) IV  500 mg Intravenous Q24H  . methylPREDNISolone (SOLU-MEDROL) injection  125 mg Intravenous Q12H  . oseltamivir  75 mg Oral BID  . pantoprazole  40 mg Oral QAC breakfast  . polyethylene glycol  17 g Oral Daily  . tiotropium  18 mcg  Inhalation Daily   Continuous: . sodium chloride 75 mL/hr at 11/08/13 0139   OQH:UTMLYYTKP, HYDROcodone-acetaminophen, LORazepam, nitroGLYCERIN  Assesment: He is admitted with COPD exacerbation and flu. He is improving. He says he feels better. He has no new complaints. Active Problems:   COPD exacerbation   Pneumonia and influenza   COPD (chronic obstructive pulmonary disease)    Plan: Continue with current treatments. He does seem to be getting better    LOS: 2 days   Trevor Berg 11/08/2013, 9:22 AM

## 2013-11-08 NOTE — Progress Notes (Signed)
Trevor Berg, Trevor Berg                 ACCOUNT NO.:  [id number]  MEDICAL RECORD NO.:  [id number]  LOCATION:  A311                          FACILITY:  [facility]  PHYSICIAN:  Yeily Link G. Everette Rank, MD   DATE OF BIRTH:  Mar 31, 1951  DATE OF PROCEDURE: DATE OF DISCHARGE:                                PROGRESS NOTE   SUBJECTIVE:  This patient continues to cough intermittently and remains congested.  He is afebrile.  He does have influenza and bronchitis and does have history of chronic obstructive pulmonary disease, chronic respiratory failure.  OBJECTIVE:  VITAL SIGNS:  Blood pressure 120/74, respirations 20, pulse 85, temperature 97.8. HEENT:  Eyes PERRLA.  TM negative.  Oropharynx benign. NECK:  Supple.  No JVD or thyroid abnormalities. HEART:  Regular rhythm.  No murmurs. LUNGS:  Rhonchi heard over lower lung fields. ABDOMEN:  No palpable organs or masses. EXTREMITIES:  Free of edema.  ASSESSMENT:  The patient was admitted with chronic obstructive pulmonary disease and exacerbation, flu syndrome, bronchitis, hypoxia, but now O2 sat is 95%.  PLAN:  Continue Tamiflu, Solu-Medrol 125 mg every 12 hours, IV Levaquin 500 mg daily.     Ina Scrivens G. Everette Rank, MD     AGM/MEDQ  D:  11/08/2013  T:  11/08/2013  Job:  [id number]
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
## 2013-11-08 NOTE — Plan of Care (Signed)
Problem: Phase I Progression Outcomes Goal: Flu/PneumoVaccines if indicated Outcome: Completed/Met Date Met:  11/08/13 Flu vac 2014 Pneumonia vac due 2017 pt is aware

## 2013-11-09 MED ORDER — LEVOFLOXACIN 500 MG PO TABS
500.0000 mg | ORAL_TABLET | Freq: Every day | ORAL | Status: DC
  Administered 2013-11-09: 500 mg via ORAL
  Filled 2013-11-09: qty 1

## 2013-11-09 NOTE — Progress Notes (Signed)
NAMEREGINA, COPPOLINO                 ACCOUNT NO.:  [id number]  MEDICAL RECORD NO.:  [id number]  LOCATION:  A311                          FACILITY:  [facility]  PHYSICIAN:  Boneta Standre G. Everette Rank, MD   DATE OF BIRTH:  [birth year]  DATE OF PROCEDURE: DATE OF DISCHARGE:                                PROGRESS NOTE   This patient continues to improve.  He still has a cough, which is intermittent, remained somewhat congested.  He does have influenza, bronchitis, and chronic obstructive pulmonary disease, chronic respiratory failure with acute phase.  PHYSICAL EXAMINATION:  VITAL SIGNS:  Alert male with blood pressure 112/64, respirations 20, pulse 73, temp 97.8. HEENT:  Eyes PERRLA.  TM negative.  Oropharynx benign. NECK:  Supple.  No JVD or thyroid abnormalities. HEART:  Regular rhythm.  No murmurs. LUNGS:  Occasional rhonchus heard over lower lung field. ABDOMEN:  No palpable organs or masses. EXTREMITIES:  Free of edema.  ASSESSMENT:  The patient was admitted with chronic obstructive pulmonary disease and exacerbation, chronic renal failure, flu syndrome, hypoxia, but now O2 sats have improved and are 99% range.  PLAN:  To continue Tamiflu, Solu-Medrol 125 mg every 12 hours, IV Levaquin 500 mg daily.     Berkleigh Beckles G. Everette Rank, MD     AGM/MEDQ  D:  11/09/2013  T:  11/09/2013  Job:  [id number]

## 2013-11-09 NOTE — Progress Notes (Signed)
PHARMACIST - PHYSICIAN COMMUNICATION DR:   Everette Rank CONCERNING: Antibiotic IV to Oral Route Change Policy  RECOMMENDATION: This patient is receiving Levaquin by the intravenous route.  Based on criteria approved by the Pharmacy and Therapeutics Committee, the antibiotic(s) is/are being converted to the equivalent oral dose form(s).   DESCRIPTION: These criteria include:  Patient being treated for a respiratory tract infection, urinary tract infection, cellulitis or clostridium difficile associated diarrhea if on metronidazole  The patient is not neutropenic and does not exhibit a GI malabsorption state  The patient is eating (either orally or via tube) and/or has been taking other orally administered medications for a least 24 hours  The patient is improving clinically and has a Tmax < 100.5  If you have questions about this conversion, please contact the Pharmacy Department  [x]   [phone] )  [facility] []   [phone] )  [facility]  []   [phone] )  [facility] []   [phone] )  [facility]   11/09/2013 ,1:54 PM Pricilla Larsson, [facility]

## 2013-11-10 MED ORDER — LEVOFLOXACIN 500 MG PO TABS: 500.0000 mg | ORAL_TABLET | Freq: Every day | ORAL | Status: DC

## 2013-11-10 NOTE — Progress Notes (Signed)
Pt O2 at rest on room air was at 96%. Pt ambulated around the hall without oxygen. O2 sats were at 95% at room air with walking. Pt was in no acute distress, no SOB. MD aware.

## 2013-11-10 NOTE — Progress Notes (Signed)
UR chart review completed.  

## 2013-11-10 NOTE — Progress Notes (Signed)
Patient states understanding of discharge instructions, prescription given. 

## 2013-11-10 NOTE — Discharge Summary (Signed)
NAMEILYAAS, MUSTO                 ACCOUNT NO.:  [id number]  MEDICAL RECORD NO.:  [id number]  LOCATION:  A311                          FACILITY:  [facility]  PHYSICIAN:  Kahli Fitzgerald G. Tangela Dolliver, MD   DATE OF BIRTH:  1951-08-07  DATE OF ADMISSION:  11/06/2013 DATE OF DISCHARGE:  LH                              DISCHARGE SUMMARY   ADDENDUM:  The patient will be sent home on the following medications: Ativan 1 mg every 4 hours as needed, Tenormin 50 mg daily, Spiriva HandiHaler 18 mcg 1 inhalation daily, Nitrostat 0.4 mg for use p.r.n. for chest pain, Proventil inhaler 2 puffs every 6 hours as needed, Lipitor 40 mg daily, aspirin 81 mg 1 daily, Protonix 40 mg daily, amlodipine 5 mg daily, MiraLax 17 g daily as needed, Mucinex 120/1200 mg every 12 hours, Norco 5/325 every 4 hours as needed.  He will also be sent home on Levaquin 500 mg daily and Tamiflu 75 mg b.i.d.     Adalyna Godbee G. Everette Rank, MD     AGM/MEDQ  D:  11/10/2013  T:  11/10/2013  Job:  [id number]

## 2013-11-10 NOTE — Progress Notes (Signed)
He is being discharged after a COPD exacerbation associated with influenza. I will of course sign off.    Thanks for allow me to see him with you

## 2013-11-11 NOTE — Discharge Summary (Signed)
Trevor Berg, Trevor Berg                 ACCOUNT NO.:  [id number]  MEDICAL RECORD NO.:  [id number]  LOCATION:  A311                          FACILITY:  [facility]  PHYSICIAN:  Trevor Aydt G. Everette Rank, MD   DATE OF BIRTH:  01-03-1951  DATE OF ADMISSION:  11/06/2013 DATE OF DISCHARGE:  01/12/2015LH                              DISCHARGE SUMMARY   DIAGNOSES: 1. Influenza. 2. Acute-on-chronic respiratory failure with chronic obstructive     pulmonary disease. 3. Hypoxia. 4. Hypertension, essential.  CONDITION:  Stable and improved at the time of his discharge.  This patient was admitted through the Emergency Department with increased shortness of breath and cough.  He was found to be hypoxic with oxygen saturation to 87%.  X-ray did not show evidence of pneumonia.  He was started on IV Levaquin, steroids, antibiotics, along with neb treatment.  PHYSICAL EXAMINATION:  GENERAL:  On admission, alert male, in no acute distress, but having some difficulty with breathing. VITAL SIGNS:  Blood pressure 125/74, respirations 20, pulse 94, temp 99.2. HEENT:  PERRLA.  TM negative.  Oropharynx benign. NECK:  Supple.  No JVD or thyroid abnormalities. HEART:  Regular rhythm.  No murmurs.  No cardiomegaly. LUNGS:  Occasional rhonchus heard over both lung fields. ABDOMEN:  No palpable organs or masses.  No organomegaly. EXTREMITIES:  Free of edema. NEUROLOGIC:  Cranial nerves intact.  No motor or sensory abnormalities.  LABORATORY DATA:  CBC on admission; WBC 7.8, hemoglobin 14.4, hematocrit 42.1.  Chemistries on admission; sodium 141, potassium 4.5, chloride 104, CO2 of 28, BUN is 10, creatinine 0.94.  Subsequent chemistries; sodium 135, potassium 4.2, chloride 99, CO2 of 22, BUN 16, creatinine 1.09.  Subsequent CBC; WBC 8.9, hemoglobin of 13.9, hematocrit 40.1. Troponin less than 0.30.  RADIOLOGY:  Chest x-ray, no active disease.  HOSPITAL COURSE:  The patient at the time of his admission was placed on the  following medications; Proventil 2.5 nebulization t.i.d.  He was continued on Norvasc 5 mg daily, aspirin 81 mg daily, Tenormin 50 mg daily, Lipitor 60 mg daily, Lovenox 40 mg subcutaneously daily for DVT prophylaxis, Mucinex 1200 mg b.i.d., IV Levaquin 500 mg daily, methylprednisone in the form of Solu-Medrol 125 mg IV every 12 hours, Tamiflu 75 mg b.i.d., Protonix 40 mg daily, Spiriva HandiHaler 18 mcg daily.  He was continued on IV fluids and nasal O2.  It was discovered that he had a positive flu test shortly after admission.  The patient slowly and progressively improved during his hospital stay.  He had continued cough, but his cough did improve, O2 sats did improve, and after 3 days hospitalization, it was felt he could go home on medications.     Jair Lindblad G. Everette Rank, MD     AGM/MEDQ  D:  11/10/2013  T:  11/10/2013  Job:  [id number]

## 2013-12-01 ENCOUNTER — Other Ambulatory Visit: Payer: Self-pay | Admitting: Adult Health

## 2013-12-01 ENCOUNTER — Other Ambulatory Visit: Payer: Self-pay | Admitting: Cardiology

## 2013-12-01 NOTE — Telephone Encounter (Signed)
Must make appointment to refill mediation. 2nd attempt through refills to make appt.

## 2013-12-10 ENCOUNTER — Telehealth: Payer: Self-pay | Admitting: Gastroenterology

## 2013-12-10 NOTE — Telephone Encounter (Signed)
Please touch base with the patient. We've received a fax from [facility] showing that he has continued to get his [place] since 04/2013 when I switched him to pantoprazole. He needs to take only one of these. See document if needed.

## 2013-12-15 NOTE — Telephone Encounter (Signed)
Spoke with patients wife. He is only taking dexilant.

## 2013-12-15 NOTE — Telephone Encounter (Signed)
Interesting since the pharmacy is filling both per [employer]. Can we touch base with his pharmacy and see if they are filling both and if so, stop the pantoprazole.

## 2013-12-18 NOTE — Telephone Encounter (Signed)
Called and left message with [facility]. Couldn't get a person on the phone. Advised that pt should only be filling the dexilant.

## 2013-12-29 ENCOUNTER — Other Ambulatory Visit: Payer: Self-pay

## 2013-12-29 MED ORDER — PANTOPRAZOLE SODIUM 40 MG PO TBEC: 40.0000 mg | DELAYED_RELEASE_TABLET | Freq: Every day | ORAL | Status: DC

## 2014-01-28 ENCOUNTER — Other Ambulatory Visit: Payer: Self-pay | Admitting: Adult Health

## 2014-01-29 ENCOUNTER — Other Ambulatory Visit: Payer: Self-pay

## 2014-01-29 MED ORDER — POLYETHYLENE GLYCOL 3350 17 GM/SCOOP PO POWD: 17.0000 g | Freq: Every day | ORAL | Status: DC

## 2014-02-20 NOTE — Progress Notes (Signed)
HPI: [name] is a 63 year old former patient of Dr. Lattie Haw we are following for ongoing assessment and management of chronic chest pain, with history of COPD and multiple cardiovascular risk factors. The patient was recently hospitalized in January 2004 in the setting of COPD exacerbation. He was also treated for pneumonia. He was continued on Tenormin 50 mg daily along with inhalers nitroglycerin statin he continued on pulmonary meds.    He comes today without any cardiac complaints. He had been admitted to the hospital in January 2015 for the flu and spent 5 days there. Other noncardiac issues include chronic arthritis where he wears devices called cough her hands to assist with his pain. He has decreased his smoking habit to just short of a pack a day, he had been up to 3 packs a day and had been a smoker since age of 60. He is medically compliant.      Allergies  Allergen Reactions  . Tomato Rash    Current Outpatient Prescriptions  Medication Sig Dispense Refill  . albuterol (PROVENTIL HFA;VENTOLIN HFA) 108 (90 BASE) MCG/ACT inhaler Inhale 2 puffs into the lungs every 6 (six) hours as needed. Shortness of Breath      . amLODipine (NORVASC) 5 MG tablet Take 1 tablet (5 mg total) by mouth daily.  90 tablet  3  . aspirin EC 81 MG tablet Take 81 mg by mouth daily.      Marland Kitchen atenolol (TENORMIN) 50 MG tablet Take 1 tablet (50 mg total) by mouth every morning.  90 tablet  3  . atorvastatin (LIPITOR) 40 MG tablet Take 40 mg by mouth daily. Takes with a 20 mg tablet to equal dose of 60 mg daily      . DEXILANT 60 MG capsule Take 60 mg by mouth daily.       Marland Kitchen HYDROcodone-acetaminophen (NORCO/VICODIN) 5-325 MG per tablet Take 2 tablets by mouth every 4 (four) hours as needed for moderate pain.  20 tablet  0  . LORazepam (ATIVAN) 1 MG tablet Take 1 mg by mouth every 4 (four) hours as needed. For nerves      . nitroGLYCERIN (NITROSTAT) 0.4 MG SL tablet Place 0.4 mg under the tongue every 5 (five)  minutes as needed. Chest Pain      . polyethylene glycol powder (GLYCOLAX/MIRALAX) powder Take 17 g by mouth daily.  255 g  3  . Pseudoephedrine-Guaifenesin (MUCINEX D) [phone] MG TB12 Take 1 tablet by mouth every 12 (twelve) hours.      Marland Kitchen tiotropium (SPIRIVA) 18 MCG inhalation capsule Place 18 mcg into inhaler and inhale every morning.       No current facility-administered medications for this visit.    Past Medical History  Diagnosis Date  . Hypertension   . Hyperlipidemia   . Anxiety   . Gastroesophageal reflux disease     possible small Schatzki's ring; esophageal dilatation in 2005  . COPD (chronic obstructive pulmonary disease)     Chronic bronchitis; 100-pack-year cigarette consumption  . Chest pain 2005    noncritical coronary disease in 2005: 40% distal RCA, 30% CX, 50% OM 2, EF-60%,  . Overweight   . Tobacco abuse     100 pack years  . H. pylori infection 2014    treated with prevpac  . Anginal pain     Past Surgical History  Procedure Laterality Date  . Cervical spine surgery      C5-6 and C6-7: Relief of spinal stenosis with corpectomy,  foraminotomy and fusion  . Orif ankle fracture      right  . Tonsillectomy    . Esophagogastroduodenoscopy  09/27/2004    RMR:   A couple of tiny, distal esophageal erosions, otherwise normal esophagus aside from a Schatzki ring status post Maloney dilation as described above/ Small hiatal hernia, otherwise normal stomach, normal D1-D2  . Colonoscopy  10/19/2004    RMR:   Friable anal canal, likely source of patient's hematochezia/  Otherwise normal rectum, normal colon.  . Orif tibia fracture      Left  . Esophagogastroduodenoscopy (egd) with esophageal dilation N/A 03/19/2013    YTK:PTWSFK esophagus s/p dilation/Abnormal stomach of uncertain clinical significance-s/p bx positive for H. pylori, status post Prevpac treatment    ROS: Review of systems complete and found to be negative unless listed above  PHYSICAL EXAM BP  117/65  Pulse 79  Ht 5\' 6"  (1.676 m)  Wt 196 lb (88.905 kg)  BMI 31.65 kg/m2 General: Well developed, well nourished, in no acute distress Head: Eyes PERRLA, No xanthomas.   Normal cephalic and atramatic  Lungs: Clear bilaterally to auscultation and percussion. Mild inspiratory wheezes cleared with cough. Heart: HRRR S1 S2, soft 1/6 systolic murmur.  Pulses are 2+ & equal.            No carotid bruit. No JVD.  No abdominal bruits. No femoral bruits. Abdomen: Bowel sounds are positive, abdomen soft and non-tender without masses or                  Hernia's noted. Msk:  Back normal, normal gait. Normal strength and tone for age. Extremities: No clubbing, cyanosis or edema.  DP +1 he is wearing "copper hands" gloves for arthritis pain. Neuro: Alert and oriented X 3. Psych:  Good affect, responds appropriately   ASSESSMENT AND PLAN

## 2014-02-23 ENCOUNTER — Ambulatory Visit (INDEPENDENT_AMBULATORY_CARE_PROVIDER_SITE_OTHER): Payer: Medicare Other | Admitting: Adult Health

## 2014-02-23 ENCOUNTER — Encounter: Payer: Self-pay | Admitting: Adult Health

## 2014-02-23 VITALS — BP 117/65 | HR 79 | Ht 66.0 in | Wt 196.0 lb

## 2014-02-23 DIAGNOSIS — R079 Chest pain, unspecified: Secondary | ICD-10-CM

## 2014-02-23 DIAGNOSIS — F17200 Nicotine dependence, unspecified, uncomplicated: Secondary | ICD-10-CM

## 2014-02-23 DIAGNOSIS — I10 Essential (primary) hypertension: Secondary | ICD-10-CM

## 2014-02-23 DIAGNOSIS — Z720 Tobacco use: Secondary | ICD-10-CM

## 2014-02-23 MED ORDER — ATENOLOL 50 MG PO TABS: 50.0000 mg | ORAL_TABLET | ORAL | Status: DC

## 2014-02-23 MED ORDER — AMLODIPINE BESYLATE 5 MG PO TABS: 5.0000 mg | ORAL_TABLET | Freq: Every day | ORAL | Status: DC

## 2014-02-23 NOTE — Progress Notes (Deleted)
Name: Trevor Berg    DOB: 15-Jul-1951  Age: 63 y.o.  MR#: [id number]       PCP:  Lanette Hampshire, MD      Insurance: Payor: [employer] MEDICARE / Plan: AARP MEDICARE COMPLETE / Product Type: *No Product type* /   CC:    Chief Complaint  Patient presents with  . Shortness of Breath  . Hypertension    VS Filed Vitals:   02/23/14 1418  BP: 117/65  Pulse: 79  Height: 5\' 6"  (1.676 m)  Weight: 196 lb (88.905 kg)    Weights Current Weight  02/23/14 196 lb (88.905 kg)  11/06/13 210 lb 15.7 oz (95.7 kg)  11/02/13 215 lb (97.523 kg)    Blood Pressure  BP Readings from Last 3 Encounters:  02/23/14 117/65  11/10/13 134/78  11/02/13 130/79     Admit date:  (Not on file) Last encounter with RMR:  01/28/2014   Allergy Tomato  Current Outpatient Prescriptions  Medication Sig Dispense Refill  . albuterol (PROVENTIL HFA;VENTOLIN HFA) 108 (90 BASE) MCG/ACT inhaler Inhale 2 puffs into the lungs every 6 (six) hours as needed. Shortness of Breath      . amLODipine (NORVASC) 5 MG tablet Take 1 tablet (5 mg total) by mouth daily.  30 tablet  1  . aspirin EC 81 MG tablet Take 81 mg by mouth daily.      Marland Kitchen atenolol (TENORMIN) 50 MG tablet Take 50 mg by mouth every morning.       Marland Kitchen atorvastatin (LIPITOR) 40 MG tablet Take 40 mg by mouth daily. Takes with a 20 mg tablet to equal dose of 60 mg daily      . DEXILANT 60 MG capsule Take 60 mg by mouth daily.       Marland Kitchen HYDROcodone-acetaminophen (NORCO/VICODIN) 5-325 MG per tablet Take 2 tablets by mouth every 4 (four) hours as needed for moderate pain.  20 tablet  0  . LORazepam (ATIVAN) 1 MG tablet Take 1 mg by mouth every 4 (four) hours as needed. For nerves      . nitroGLYCERIN (NITROSTAT) 0.4 MG SL tablet Place 0.4 mg under the tongue every 5 (five) minutes as needed. Chest Pain      . polyethylene glycol powder (GLYCOLAX/MIRALAX) powder Take 17 g by mouth daily.  255 g  3  . Pseudoephedrine-Guaifenesin (MUCINEX D) [phone] MG TB12 Take 1  tablet by mouth every 12 (twelve) hours.      Marland Kitchen tiotropium (SPIRIVA) 18 MCG inhalation capsule Place 18 mcg into inhaler and inhale every morning.       No current facility-administered medications for this visit.    Discontinued Meds:    Medications Discontinued During This Encounter  Medication Reason  . atorvastatin (LIPITOR) 40 MG tablet Error  . levofloxacin (LEVAQUIN) 500 MG tablet Error  . pantoprazole (PROTONIX) 40 MG tablet Error    Patient Active Problem List   Diagnosis Date Noted  . COPD (chronic obstructive pulmonary disease) 11/07/2013  . COPD exacerbation 11/06/2013  . Pneumonia and influenza 11/06/2013  . Cough with sputum 05/07/2013  . Esophageal dysphagia 02/25/2013  . Chronic constipation 02/25/2013  . Abdominal pain, chronic, epigastric 02/25/2013  . Chest pain   . Tobacco abuse     LABS    Component Value Date/Time   NA 135* 11/06/2013 1431   NA 141 11/02/2013 1015   NA 137 06/24/2013 2336   K 4.2 11/06/2013 1431   K 4.5 11/02/2013 1015  K 4.1 06/24/2013 2336   CL 99 11/06/2013 1431   CL 104 11/02/2013 1015   CL 101 06/24/2013 2336   CO2 22 11/06/2013 1431   CO2 28 11/02/2013 1015   CO2 27 06/24/2013 2336   GLUCOSE 103* 11/06/2013 1431   GLUCOSE 100* 11/02/2013 1015   GLUCOSE 95 06/24/2013 2336   BUN 16 11/06/2013 1431   BUN 10 11/02/2013 1015   BUN 11 06/24/2013 2336   BUN 10 10/15/2012   CREATININE 1.09 11/06/2013 1431   CREATININE 0.94 11/02/2013 1015   CREATININE 1.02 06/24/2013 2336   CREATININE 0.96 10/15/2012   CALCIUM 8.7 11/06/2013 1431   CALCIUM 9.0 11/02/2013 1015   CALCIUM 9.0 06/24/2013 2336   GFRNONAA 71* 11/06/2013 1431   GFRNONAA 88* 11/02/2013 1015   GFRNONAA 77* 06/24/2013 2336   GFRAA 83* 11/06/2013 1431   GFRAA >90 11/02/2013 1015   GFRAA 90* 06/24/2013 2336   CMP     Component Value Date/Time   NA 135* 11/06/2013 1431   K 4.2 11/06/2013 1431   CL 99 11/06/2013 1431   CO2 22 11/06/2013 1431   GLUCOSE 103* 11/06/2013 1431   BUN 16 11/06/2013 1431   BUN 10  10/15/2012   CREATININE 1.09 11/06/2013 1431   CREATININE 0.96 10/15/2012   CALCIUM 8.7 11/06/2013 1431   PROT 6.9 11/02/2013 1015   PROT 5.9 10/15/2012   ALBUMIN 4.0 11/02/2013 1015   AST 22 11/02/2013 1015   AST 24 10/15/2012   ALT 17 11/02/2013 1015   ALKPHOS 86 11/02/2013 1015   ALKPHOS 70 10/15/2012   BILITOT 0.3 11/02/2013 1015   GFRNONAA 71* 11/06/2013 1431   GFRAA 83* 11/06/2013 1431       Component Value Date/Time   WBC 8.9 11/06/2013 1431   WBC 7.8 11/02/2013 1015   WBC 8.1 06/24/2013 2336   HGB 13.9 11/06/2013 1431   HGB 14.4 11/02/2013 1015   HGB 15.7 06/24/2013 2336   HCT 40.7 11/06/2013 1431   HCT 42.1 11/02/2013 1015   HCT 45.5 06/24/2013 2336   HCT 44 05/17/2013   MCV 94.4 11/06/2013 1431   MCV 95.7 11/02/2013 1015   MCV 93.6 06/24/2013 2336   MCV 92.6 05/17/2013    Lipid Panel  No results found for this basename: chol, trig, hdl, cholhdl, vldl, ldlcalc    ABG No results found for this basename: phart, pco2, pco2art, po2, po2art, hco3, tco2, acidbasedef, o2sat     Lab Results  Component Value Date   TSH 1.22 10/15/2012   BNP (last 3 results) No results found for this basename: PROBNP,  in the last 8760 hours Cardiac Panel (last 3 results) No results found for this basename: CKTOTAL, CKMB, TROPONINI, RELINDX,  in the last 72 hours  Iron/TIBC/Ferritin No results found for this basename: iron, tibc, ferritin     EKG Orders placed during the hospital encounter of 11/06/13  . ED EKG  . ED EKG  . EKG 12-LEAD  . EKG 12-LEAD  . EKG     Prior Assessment and Plan Problem List as of 02/23/2014     Respiratory   COPD exacerbation   Pneumonia and influenza   COPD (chronic obstructive pulmonary disease)     Digestive   Esophageal dysphagia   Last Assessment & Plan   02/25/2013 Office Visit Written 02/25/2013 11:53 AM by Mahala Menghini, PA-C     Chronic GERD with breakthrough heartburn on Dexilant. Also with solid food dysphagia and now with dysphagia to  large pills. Remote EGD with  dilation helped in the past. Patient also complains with atypical chest pain related to meals, epigastric pain with meals. Abdominal ultrasound showed fatty liver last year. Recommend EGD for further evaluation of abdominal/chest pain as well as dysphagia. Suspect he has recurrent esophageal ring or stricture.  I have discussed the risks, alternatives, benefits with regards to but not limited to the risk of reaction to medication, bleeding, infection, perforation and the patient is agreeable to proceed. Written consent to be obtained.     Chronic constipation   Last Assessment & Plan   02/25/2013 Office Visit Written 02/25/2013 11:51 AM by Mahala Menghini, PA-C     Doing better on Miralax. Patient not interested in colonoscopy at this time. He is due in 2015.       Other   Chest pain   Last Assessment & Plan   12/26/2012 Office Visit Edited 12/28/2012  7:06 PM by Yehuda Savannah, MD     Chest discomfort of unclear etiology continues. I think it more likely that his symptoms are related to chronic lung disease than to coronary artery disease as suggested by a recent negative stress nuclear study. Consideration could be given to addition of a combination long-acting beta agonist/inhaled steroid MDI.  Since additional lowering of blood pressure is warranted, amlodipine dose will be increased to 5 mg per day.    Tobacco abuse   Last Assessment & Plan   11/07/2012 Office Visit Written 11/07/2012  3:30 PM by Yehuda Savannah, MD     Quit attempt advised with the assistance of transdermal nicotine patches.  We will continue to monitor.    Abdominal pain, chronic, epigastric   Last Assessment & Plan   05/07/2013 Office Visit Edited 05/07/2013  2:56 PM by Mahala Menghini, PA-C     Suspect chronic upper abdominal pain related to abdominal wall pain from frequent coughing. Also with lower chest wall pain on exam. Symptoms worse with movement. He has never seen a pulmonologist for his COPD. Given ongoing discomfort,  we were refer him to Dr. Luan Pulling to make sure he has appropriate management of his COPD. We will change PPI to see if we can get any better improvement in his postprandial abdominal pain. Regarding loose stools, suspect MiraLax related. We'll start with decreasing dose as patient states that he develops recurrent constipation when he stops the medication. If he continues to have frequent loose stools, consider colonoscopy sooner than scheduled.   1. Stop Dexilant. Start pantoprazole 1 capsule 30 minutes before breakfast daily. 2. Decrease MiraLax to once daily. If you continue to have very loose stool, take MiraLax only at bedtime on days you have not had a bowel movement. Call if you continue to have diarrhea. 3. Referral to Dr. Luan Pulling for chronic cough which is likely resulting in chronic chest/abdominal wall pain. 4. I will request copy of your labs from Dr. Everette Rank for further review.    Cough with sputum       Imaging: No results found.

## 2014-02-23 NOTE — Patient Instructions (Signed)
Your physician recommends that you schedule a follow-up appointment in: 1 year with Dr Branch You will receive a reminder letter two months in advance reminding you to call and schedule your appointment. If you don't receive this letter, please contact our office.  Your physician recommends that you continue on your current medications as directed. Please refer to the Current Medication list given to you today.   

## 2014-02-23 NOTE — Assessment & Plan Note (Signed)
He remains on amlodipine 5 mg daily and atenolol 50 mg daily. Labs are drawn by primary care physician Dr. Emilee Hero. He has recently had them done 2 weeks ago. He is given refills on both of the medications. We will see him again in one year unless he is symptomatic.

## 2014-02-23 NOTE — Assessment & Plan Note (Signed)
He is continuing to cut down on his tobacco use. He is stopped from 3 packs a day to just under a pack a day. He is unable to tolerate can take to do to nightmares and is now a changes. I will place this on his allergy medication list.
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
## 2014-06-01 ENCOUNTER — Other Ambulatory Visit: Payer: Self-pay | Admitting: *Deleted

## 2014-06-01 MED ORDER — POLYETHYLENE GLYCOL 3350 17 GM/SCOOP PO POWD: ORAL | Status: DC

## 2014-06-29 ENCOUNTER — Telehealth: Payer: Self-pay | Admitting: Internal Medicine

## 2014-06-29 MED ORDER — PANTOPRAZOLE SODIUM 40 MG PO TBEC: 40.0000 mg | DELAYED_RELEASE_TABLET | Freq: Every day | ORAL | Status: DC

## 2014-06-29 NOTE — Telephone Encounter (Signed)
Completed.

## 2014-06-29 NOTE — Telephone Encounter (Signed)
PATIENT NEEDS REFILL CALLED INTO [facility] [place]. PANTOPRAZOLE 40MG 

## 2014-06-29 NOTE — Telephone Encounter (Signed)
Routing to refill box  

## 2014-10-01 ENCOUNTER — Other Ambulatory Visit: Payer: Self-pay

## 2014-10-02 MED ORDER — POLYETHYLENE GLYCOL 3350 17 GM/SCOOP PO POWD: ORAL | Status: DC

## 2014-10-05 ENCOUNTER — Other Ambulatory Visit: Payer: Self-pay

## 2014-10-06 MED ORDER — POLYETHYLENE GLYCOL 3350 17 GM/SCOOP PO POWD: ORAL | Status: DC

## 2014-10-08 ENCOUNTER — Ambulatory Visit (HOSPITAL_COMMUNITY)
Admission: RE | Admit: 2014-10-08 | Discharge: 2014-10-08 | Disposition: A | Discharge status: Home / Self Care | Payer: Medicare Other | Source: Ambulatory Visit | Attending: Family Medicine | Admitting: Family Medicine

## 2014-10-08 ENCOUNTER — Other Ambulatory Visit (HOSPITAL_COMMUNITY): Payer: Self-pay | Admitting: Family Medicine

## 2014-10-08 DIAGNOSIS — R059 Cough, unspecified: Secondary | ICD-10-CM

## 2014-10-08 DIAGNOSIS — M25561 Pain in right knee: Secondary | ICD-10-CM | POA: Insufficient documentation

## 2014-10-08 DIAGNOSIS — G8929 Other chronic pain: Secondary | ICD-10-CM | POA: Diagnosis not present

## 2014-10-08 DIAGNOSIS — R05 Cough: Secondary | ICD-10-CM | POA: Insufficient documentation

## 2014-10-08 DIAGNOSIS — Z87891 Personal history of nicotine dependence: Secondary | ICD-10-CM | POA: Insufficient documentation

## 2014-11-12 ENCOUNTER — Encounter (HOSPITAL_COMMUNITY): Payer: Self-pay | Admitting: Internal Medicine

## 2015-03-01 ENCOUNTER — Other Ambulatory Visit: Payer: Self-pay | Admitting: Adult Health

## 2015-03-01 ENCOUNTER — Telehealth: Payer: Self-pay | Admitting: Adult Health

## 2015-03-01 NOTE — Telephone Encounter (Signed)
atenolol (TENORMIN) 50 MG tablet- patient is out of this medication  [facility] [place]

## 2015-03-01 NOTE — Telephone Encounter (Signed)
Refill complete 

## 2015-03-01 NOTE — Telephone Encounter (Signed)
amLODipine (NORVASC) 5 MG tablet [facility] [facility]  Out of medication   Requesting 3 month supply on both

## 2015-03-09 ENCOUNTER — Encounter: Payer: Self-pay | Admitting: Cardiology

## 2015-03-09 ENCOUNTER — Ambulatory Visit (INDEPENDENT_AMBULATORY_CARE_PROVIDER_SITE_OTHER): Payer: Medicare HMO | Admitting: Cardiology

## 2015-03-09 VITALS — BP 122/82 | HR 71 | Ht 66.0 in | Wt 214.0 lb

## 2015-03-09 DIAGNOSIS — E785 Hyperlipidemia, unspecified: Secondary | ICD-10-CM | POA: Diagnosis not present

## 2015-03-09 DIAGNOSIS — I2510 Atherosclerotic heart disease of native coronary artery without angina pectoris: Secondary | ICD-10-CM | POA: Diagnosis not present

## 2015-03-09 DIAGNOSIS — R0789 Other chest pain: Secondary | ICD-10-CM | POA: Diagnosis not present

## 2015-03-09 DIAGNOSIS — I10 Essential (primary) hypertension: Secondary | ICD-10-CM

## 2015-03-09 NOTE — Progress Notes (Signed)
Clinical Summary Trevor Berg is a 64 y.o.male last seen by NP Purcell Nails, this is our first visit together. She is seen for the following medical problems.  1.CAD - cath in 2005 without mild to moderate non-obstructive disease - negative stress echo 09/2012, baseline LVEF 55-60% - sharp aching pain, 7/10. Can occur at rest or with exrtion. No other associated symptoms. Can last up to 1 hour. Worst with movment. Variable in frequency. Similar to previous pain. No relation to food.  - DOE at 2 blocks, which is stable.  - compliant with meds  2. COPD - compliant with meds  3. HTN - compliant with meds   4. HL - reports recent labs with pcp    Past Medical History  Diagnosis Date  . Hypertension   . Hyperlipidemia   . Anxiety   . Gastroesophageal reflux disease     possible small Schatzki's ring; esophageal dilatation in 2005  . COPD (chronic obstructive pulmonary disease)     Chronic bronchitis; 100-pack-year cigarette consumption  . Chest pain 2005    noncritical coronary disease in 2005: 40% distal RCA, 30% CX, 50% OM 2, EF-60%,  . Overweight(278.02)   . Tobacco abuse     100 pack years  . H. pylori infection 2014    treated with prevpac  . Anginal pain      Allergies  Allergen Reactions  . Chantix [Varenicline]     Nightmares and personality changes.  . Tomato Rash     Current Outpatient Prescriptions  Medication Sig Dispense Refill  . albuterol (PROVENTIL HFA;VENTOLIN HFA) 108 (90 BASE) MCG/ACT inhaler Inhale 2 puffs into the lungs every 6 (six) hours as needed. Shortness of Breath    . amLODipine (NORVASC) 5 MG tablet TAKE ONE TABLET BY MOUTH ONCE DAILY 90 tablet 0  . aspirin EC 81 MG tablet Take 81 mg by mouth daily.    Marland Kitchen atenolol (TENORMIN) 50 MG tablet TAKE ONE TABLET BY MOUTH IN THE MORNING 90 tablet 0  . atorvastatin (LIPITOR) 40 MG tablet Take 40 mg by mouth daily. Takes with a 20 mg tablet to equal dose of 60 mg daily    . DEXILANT 60 MG capsule  Take 60 mg by mouth daily.     Marland Kitchen HYDROcodone-acetaminophen (NORCO/VICODIN) 5-325 MG per tablet Take 2 tablets by mouth every 4 (four) hours as needed for moderate pain. 20 tablet 0  . LORazepam (ATIVAN) 1 MG tablet Take 1 mg by mouth every 4 (four) hours as needed. For nerves    . nitroGLYCERIN (NITROSTAT) 0.4 MG SL tablet Place 0.4 mg under the tongue every 5 (five) minutes as needed. Chest Pain    . pantoprazole (PROTONIX) 40 MG tablet Take 1 tablet (40 mg total) by mouth daily. 90 tablet 3  . polyethylene glycol powder (GLYCOLAX/MIRALAX) powder Use 1 capful daily as needed for constipation 225 g 3  . Pseudoephedrine-Guaifenesin (MUCINEX D) [phone] MG TB12 Take 1 tablet by mouth every 12 (twelve) hours.    Marland Kitchen tiotropium (SPIRIVA) 18 MCG inhalation capsule Place 18 mcg into inhaler and inhale every morning.     No current facility-administered medications for this visit.     Past Surgical History  Procedure Laterality Date  . Cervical spine surgery      C5-6 and C6-7: Relief of spinal stenosis with corpectomy, foraminotomy and fusion  . Orif ankle fracture      right  . Tonsillectomy    . Esophagogastroduodenoscopy  09/27/2004  RMR:   A couple of tiny, distal esophageal erosions, otherwise normal esophagus aside from a Schatzki ring status post Maloney dilation as described above/ Small hiatal hernia, otherwise normal stomach, normal D1-D2  . Colonoscopy  10/19/2004    RMR:   Friable anal canal, likely source of patient's hematochezia/  Otherwise normal rectum, normal colon.  . Orif tibia fracture      Left  . Esophagogastroduodenoscopy (egd) with esophageal dilation N/A 03/19/2013    ENI:DPOEUM esophagus s/p dilation/Abnormal stomach of uncertain clinical significance-s/p bx positive for H. pylori, status post Prevpac treatment     Allergies  Allergen Reactions  . Chantix [Varenicline]     Nightmares and personality changes.  . Tomato Rash      Family History  Problem  Relation Age of Onset  . Heart attack Mother     deceased, age 68s  . Ulcers Brother   . Diabetes Brother   . Colon cancer Neg Hx   . Hypertension       Social History Trevor Berg reports that he has been smoking Cigarettes.  He has a 75 pack-year smoking history. He does not have any smokeless tobacco history on file. Trevor Berg reports that he does not drink alcohol.   Review of Systems CONSTITUTIONAL: No weight loss, fever, chills, weakness or fatigue.  HEENT: Eyes: No visual loss, blurred vision, double vision or yellow sclerae.No hearing loss, sneezing, congestion, runny nose or sore throat.  SKIN: No rash or itching.  CARDIOVASCULAR: per HPI RESPIRATORY: No shortness of breath, cough or sputum.  GASTROINTESTINAL: No anorexia, nausea, vomiting or diarrhea. No abdominal pain or blood.  GENITOURINARY: No burning on urination, no polyuria NEUROLOGICAL: No headache, dizziness, syncope, paralysis, ataxia, numbness or tingling in the extremities. No change in bowel or bladder control.  MUSCULOSKELETAL: No muscle, back pain, joint pain or stiffness.  LYMPHATICS: No enlarged nodes. No history of splenectomy.  PSYCHIATRIC: No history of depression or anxiety.  ENDOCRINOLOGIC: No reports of sweating, cold or heat intolerance. No polyuria or polydipsia.  Marland Kitchen   Physical Examination p 71 bp 122/82 Wt 214 lbs BMI 35 Gen: resting comfortably, no acute distress HEENT: no scleral icterus, pupils equal round and reactive, no palptable cervical adenopathy,  CV: RRR, no m/r/g, no JVD, no carotid bruits Resp: Clear to auscultation bilaterally GI: abdomen is soft, non-tender, non-distended, normal bowel sounds, no hepatosplenomegaly MSK: extremities are warm, no edema.  Skin: warm, no rash Neuro:  no focal deficits Psych: appropriate affect   Diagnostic Studies 07/2014 Cath The left main is a large vessel with no significant disease.  The LAD is a medium size vessel which courses three  quarters of the way down the anterior wall and has no significant disease. There are two small diagonal branches with no significant disease.  The left circumflex is a large vessel coursing through the AV groove. The third obtuse marginal Laquita Harlan is free of the AV groove circumflex and noted to have a mild 30% narrowing after the takeoff the second OM.  The first OM is a small vessel which bifurcates in the proximal portion with no significant disease.  The second OM is a large vessel which bifurcates distally and has 50% mid vessel stenosis.  The third OM is a small vessel with no significant disease.  The right coronary artery is a large vessel which is dominant and gives rise to both the PDA as well as the posterolateral Lisett Dirusso. The RCA is ectatic but has no high grade  stenosis.  The PDA is a medium size vessel with no significant disease.  The PLA is a large vessel which bifurcates in the mid segment. There is 40% lesion in the proximal portion fo the lower bifurcation.  Left ventriculogram reveals a preserved ejection fraction of 60%.  Hemodynamics: The systemic arterial pressure was 112/69. Left ventricular pressure was 115/8. Left ventricular end diastolic pressure was 13.  CONCLUSION: 1. Non-critical coronary artery disease. 2. Normal left ventricular systolic function.   09/2012 Stress echo Study Conclusions  - Stress: There was resting hypertension with a hypertensive response to stress. Atypical chest pain was present before and during exercise without increase in intensity, and it persisted following conclusion of the test. - Stress ECG conclusions: No diagnostic ST abnormalities or arrhythmias. The stress ECG was negative for ischemia. Duke scoring: exercise time of 71min; maximum ST deviation of 71mm; angina present but did not limit exercise; resulting score is -1. This score predicts a moderate  risk of cardiac events. - Staged echo: There was no echocardiographic evidence for stress-induced ischemia. - Peak stress: LV global systolic function was vigorous. The estimated LV ejection fraction was 70% to 75%. No evidence for new LV regional wall motion abnormalities.  Assessment and Plan  1. CAD - mild to moderate non-obstructive CAD by cath 2005, negative stress echo 09/2012 - chronic atypical chest pain that is unchanged.  - continue risk factor modification  2. HTN - at goal, continue curreng meds  3. HL - request most recent lipid panel from pcp - continue staitn    F/u 1 year   Arnoldo Lenis, M.D.

## 2015-03-09 NOTE — Patient Instructions (Signed)
Your physician wants you to follow-up in: 1 year with DrBranch You will receive a reminder letter in the mail two months in advance. If you don't receive a letter, please call our office to schedule the follow-up appointment.     Your physician recommends that you continue on your current medications as directed. Please refer to the Current Medication list given to you today.      Thank you for choosing [facility] !        

## 2015-05-03 ENCOUNTER — Emergency Department (HOSPITAL_COMMUNITY)
Admission: EM | Admit: 2015-05-03 | Discharge: 2015-05-03 | Disposition: A | Discharge status: Home / Self Care | Payer: Medicare HMO | Attending: Emergency Medicine | Admitting: Emergency Medicine

## 2015-05-03 ENCOUNTER — Encounter (HOSPITAL_COMMUNITY): Payer: Self-pay | Admitting: Emergency Medicine

## 2015-05-03 ENCOUNTER — Emergency Department (HOSPITAL_COMMUNITY): Payer: Medicare HMO

## 2015-05-03 DIAGNOSIS — Z7982 Long term (current) use of aspirin: Secondary | ICD-10-CM | POA: Diagnosis not present

## 2015-05-03 DIAGNOSIS — F419 Anxiety disorder, unspecified: Secondary | ICD-10-CM | POA: Diagnosis not present

## 2015-05-03 DIAGNOSIS — J441 Chronic obstructive pulmonary disease with (acute) exacerbation: Secondary | ICD-10-CM | POA: Insufficient documentation

## 2015-05-03 DIAGNOSIS — Z8619 Personal history of other infectious and parasitic diseases: Secondary | ICD-10-CM | POA: Insufficient documentation

## 2015-05-03 DIAGNOSIS — I10 Essential (primary) hypertension: Secondary | ICD-10-CM | POA: Diagnosis not present

## 2015-05-03 DIAGNOSIS — E663 Overweight: Secondary | ICD-10-CM | POA: Diagnosis not present

## 2015-05-03 DIAGNOSIS — Z720 Tobacco use: Secondary | ICD-10-CM | POA: Diagnosis not present

## 2015-05-03 DIAGNOSIS — Z79899 Other long term (current) drug therapy: Secondary | ICD-10-CM | POA: Diagnosis not present

## 2015-05-03 DIAGNOSIS — R0602 Shortness of breath: Secondary | ICD-10-CM | POA: Diagnosis present

## 2015-05-03 DIAGNOSIS — K219 Gastro-esophageal reflux disease without esophagitis: Secondary | ICD-10-CM | POA: Diagnosis not present

## 2015-05-03 DIAGNOSIS — E785 Hyperlipidemia, unspecified: Secondary | ICD-10-CM | POA: Insufficient documentation

## 2015-05-03 LAB — CBC
HCT: 42.6 % (ref 39.0–52.0)
Hemoglobin: 14.5 g/dL (ref 13.0–17.0)
MCH: 32.7 pg (ref 26.0–34.0)
MCHC: 34 g/dL (ref 30.0–36.0)
MCV: 95.9 fL (ref 78.0–100.0)
Platelets: 199 10*3/uL (ref 150–400)
RBC: 4.44 MIL/uL (ref 4.22–5.81)
RDW: 13.4 % (ref 11.5–15.5)
WBC: 7.4 10*3/uL (ref 4.0–10.5)

## 2015-05-03 LAB — COMPREHENSIVE METABOLIC PANEL
ALT: 19 U/L (ref 17–63)
AST: 20 U/L (ref 15–41)
Albumin: 4 g/dL (ref 3.5–5.0)
Alkaline Phosphatase: 68 U/L (ref 38–126)
Anion gap: 6 (ref 5–15)
BUN: 13 mg/dL (ref 6–20)
CO2: 28 mmol/L (ref 22–32)
Calcium: 8.5 mg/dL — ABNORMAL LOW (ref 8.9–10.3)
Chloride: 108 mmol/L (ref 101–111)
Creatinine, Ser: 0.92 mg/dL (ref 0.61–1.24)
GFR calc Af Amer: >60 mL/min (ref >60)
GFR calc non Af Amer: >60 mL/min (ref >60)
Glucose, Bld: 84 mg/dL (ref 65–99)
Potassium: 4.3 mmol/L (ref 3.5–5.1)
Sodium: 142 mmol/L (ref 135–145)
Total Bilirubin: 0.6 mg/dL (ref 0.3–1.2)
Total Protein: 6.6 g/dL (ref 6.5–8.1)

## 2015-05-03 LAB — TROPONIN I: Troponin I: <0.03 ng/mL (ref <0.031)

## 2015-05-03 LAB — BRAIN NATRIURETIC PEPTIDE: B Natriuretic Peptide: 15 pg/mL (ref 0.0–100.0)

## 2015-05-03 MED ORDER — IPRATROPIUM-ALBUTEROL 0.5-2.5 (3) MG/3ML IN SOLN
3.0000 mL | Freq: Once | RESPIRATORY_TRACT | Status: AC
  Administered 2015-05-03: 3 mL via RESPIRATORY_TRACT
  Filled 2015-05-03: qty 3

## 2015-05-03 MED ORDER — IPRATROPIUM BROMIDE 0.02 % IN SOLN: 0.5000 mg | Freq: Once | RESPIRATORY_TRACT | Status: DC

## 2015-05-03 MED ORDER — ALBUTEROL SULFATE (2.5 MG/3ML) 0.083% IN NEBU: 5.0000 mg | INHALATION_SOLUTION | Freq: Once | RESPIRATORY_TRACT | Status: DC

## 2015-05-03 MED ORDER — PREDNISONE 10 MG PO TABS: 20.0000 mg | ORAL_TABLET | Freq: Two times a day (BID) | ORAL | Status: DC

## 2015-05-03 MED ORDER — ALBUTEROL SULFATE (2.5 MG/3ML) 0.083% IN NEBU
2.5000 mg | INHALATION_SOLUTION | Freq: Once | RESPIRATORY_TRACT | Status: AC
  Administered 2015-05-03: 2.5 mg via RESPIRATORY_TRACT
  Filled 2015-05-03: qty 3

## 2015-05-03 MED ORDER — METHYLPREDNISOLONE SODIUM SUCC 125 MG IJ SOLR
125.0000 mg | Freq: Once | INTRAMUSCULAR | Status: AC
  Administered 2015-05-03: 125 mg via INTRAVENOUS
  Filled 2015-05-03: qty 2

## 2015-05-03 MED ORDER — AZITHROMYCIN 250 MG PO TABS: ORAL_TABLET | ORAL | Status: DC

## 2015-05-03 NOTE — ED Notes (Signed)
Patient with no complaints at this time. Respirations even and unlabored. Skin warm/dry. Discharge instructions reviewed with patient at this time. Patient given opportunity to voice concerns/ask questions. IV removed per policy and band-aid applied to site. Patient discharged at this time and left Emergency Department with steady gait.  

## 2015-05-03 NOTE — ED Provider Notes (Signed)
CSN: [id number]     Arrival date & time 05/03/15  1305 History   First MD Initiated Contact with Patient 05/03/15 1423     Chief Complaint  Patient presents with  . Shortness of Breath     (Consider location/radiation/quality/duration/timing/severity/associated sxs/prior Treatment) HPI Comments: Patient is a 64 year old male patient is a 64 year old male with history of COPD, hypertension, and tobacco abuse. He presents for evaluation of shortness of breath and cough that started yesterday. He denies fevers or chills. No chest pain or increased leg swelling.  He continues to smoke 1 PPD.    Patient is a 64 y.o. male presenting with shortness of breath. The history is provided by the patient.  Shortness of Breath Severity:  Moderate Onset quality:  Sudden Duration:  2 days Timing:  Constant Progression:  Worsening Chronicity:  New Relieved by:  Nothing Worsened by:  Nothing tried Ineffective treatments:  Inhaler Associated symptoms: cough   Associated symptoms: no hemoptysis     Past Medical History  Diagnosis Date  . Hypertension   . Hyperlipidemia   . Anxiety   . Gastroesophageal reflux disease     possible small Schatzki's ring; esophageal dilatation in 2005  . COPD (chronic obstructive pulmonary disease)     Chronic bronchitis; 100-pack-year cigarette consumption  . Chest pain 2005    noncritical coronary disease in 2005: 40% distal RCA, 30% CX, 50% OM 2, EF-60%,  . Overweight(278.02)   . Tobacco abuse     100 pack years  . H. pylori infection 2014    treated with prevpac  . Anginal pain    Past Surgical History  Procedure Laterality Date  . Cervical spine surgery      C5-6 and C6-7: Relief of spinal stenosis with corpectomy, foraminotomy and fusion  . Orif ankle fracture      right  . Tonsillectomy    . Esophagogastroduodenoscopy  09/27/2004    RMR:   A couple of tiny, distal esophageal erosions, otherwise normal esophagus aside from a Schatzki ring status  post Maloney dilation as described above/ Small hiatal hernia, otherwise normal stomach, normal D1-D2  . Colonoscopy  10/19/2004    RMR:   Friable anal canal, likely source of patient's hematochezia/  Otherwise normal rectum, normal colon.  . Orif tibia fracture      Left  . Esophagogastroduodenoscopy (egd) with esophageal dilation N/A 03/19/2013    [id number] esophagus s/p dilation/Abnormal stomach of uncertain clinical significance-s/p bx positive for H. pylori, status post Prevpac treatment   Family History  Problem Relation Age of Onset  . Heart attack Mother     deceased, age 75s  . Ulcers Brother   . Diabetes Brother   . Colon cancer Neg Hx   . Hypertension     History  Substance Use Topics  . Smoking status: Current Every Day Smoker -- 1.50 packs/day for 50 years    Types: Cigarettes  . Smokeless tobacco: Never Used     Comment: 1 pack per day as of 08/2012  . Alcohol Use: No     Comment: occasionally, usually holidays but sometimes on weekends, prior heavy use    Review of Systems  Respiratory: Positive for cough and shortness of breath. Negative for hemoptysis.   All other systems reviewed and are negative.     Allergies  Chantix and Tomato  Home Medications   Prior to Admission medications   Medication Sig Start Date End Date Taking? Authorizing Provider  albuterol (PROVENTIL HFA;VENTOLIN HFA) 108 (  90 BASE) MCG/ACT inhaler Inhale 2 puffs into the lungs every 6 (six) hours as needed. Shortness of Breath    Historical Provider, MD  amLODipine (NORVASC) 5 MG tablet TAKE ONE TABLET BY MOUTH ONCE DAILY 03/01/15   Lendon Colonel, NP  ANORO ELLIPTA 62.5-25 MCG/INH AEPB daily.  03/08/15   Historical Provider, MD  aspirin EC 81 MG tablet Take 81 mg by mouth daily.    Historical Provider, MD  atenolol (TENORMIN) 50 MG tablet TAKE ONE TABLET BY MOUTH IN THE MORNING 03/01/15   Lendon Colonel, NP  atorvastatin (LIPITOR) 40 MG tablet Take 40 mg by mouth daily. Takes with a  20 mg tablet to equal dose of 60 mg daily    Historical Provider, MD  DEXILANT 60 MG capsule Take 60 mg by mouth daily.  01/28/14   Historical Provider, MD  HYDROcodone-acetaminophen (NORCO/VICODIN) 5-325 MG per tablet Take 2 tablets by mouth every 4 (four) hours as needed for moderate pain. 11/02/13   Orpah Greek, MD  LORazepam (ATIVAN) 1 MG tablet Take 1 mg by mouth every 4 (four) hours as needed. For nerves    Historical Provider, MD  nitroGLYCERIN (NITROSTAT) 0.4 MG SL tablet Place 0.4 mg under the tongue every 5 (five) minutes as needed. Chest Pain    Historical Provider, MD  pantoprazole (PROTONIX) 40 MG tablet Take 1 tablet (40 mg total) by mouth daily. 06/29/14   Orvil Feil, NP  polyethylene glycol powder [facility]) powder Use 1 capful daily as needed for constipation 10/06/14   Orvil Feil, NP  Pseudoephedrine-Guaifenesin (MUCINEX D) 475 [id number] 2674 MG TB12 Take 1 tablet by mouth every 12 (twelve) hours.    Historical Provider, MD   BP 139/86 mmHg  Pulse 73  Temp(Src) 97.7 F (36.5 C) (Oral)  Resp 18  Ht 5\' 6"  (1.676 m)  Wt 224 lb (101.606 kg)  BMI 36.17 kg/m2  SpO2 96% Physical Exam  Constitutional: He is oriented to person, place, and time. He appears well-developed and well-nourished. No distress.  HENT:  Head: Normocephalic and atraumatic.  Mouth/Throat: Oropharynx is clear and moist.  Neck: Normal range of motion. Neck supple.  Cardiovascular: Normal rate, regular rhythm and normal heart sounds.   No murmur heard. Pulmonary/Chest: Effort normal. No respiratory distress. He has wheezes. He has no rales.  There are bilateral expiratory wheezes present.  Abdominal: Soft. Bowel sounds are normal. He exhibits no distension. There is no tenderness.  Musculoskeletal: Normal range of motion. He exhibits edema.  Lymphadenopathy:    He has no cervical adenopathy.  Neurological: He is alert and oriented to person, place, and time.  Skin: Skin is warm and dry. He is not  diaphoretic.  Nursing note and vitals reviewed.   ED Course  Procedures (including critical care time) Labs Review Labs Reviewed  CBC  COMPREHENSIVE METABOLIC PANEL  TROPONIN I  BRAIN NATRIURETIC PEPTIDE    Imaging Review No results found.   EKG Interpretation   Date/Time:  Monday May 03 2015 14:29:35 EDT Ventricular Rate:  69 PR Interval:  181 QRS Duration: 94 QT Interval:  402 QTC Calculation: 431 R Axis:   27 Text Interpretation:  Sinus rhythm Normal ECG No change from 11/06/2013  Confirmed by Jerrie Gullo  MD, Devita Nies ([id number]) on 05/03/2015 2:34:34 PM      MDM   Final diagnoses:  None    Patient presents with complaints of difficulty breathing. He has a history of COPD and continues to smoke cigarettes. He has  wheezing initially upon presentation which has improved significantly with albuterol and Atrovent. Workup reveals negative troponin, unchanged EKG, normal BNP, and chest x-ray showing no acute pulmonary disease. I believe this to be a flareup of COPD which will be treated with Zithromax, prednisone, and continued use of his inhaler.    Veryl Speak, MD 05/03/15 [phone]

## 2015-05-03 NOTE — Discharge Instructions (Signed)
Zithromax and prednisone as prescribed.  Continue your albuterol inhaler: 2 puffs every 4 hours as needed for wheezing.  Return to the emergency department if symptoms significantly worsen or change.   Chronic Obstructive Pulmonary Disease Exacerbation Chronic obstructive pulmonary disease (COPD) is a common lung condition in which airflow from the lungs is limited. COPD is a general term that can be used to describe many different lung problems that limit airflow, including chronic bronchitis and emphysema. COPD exacerbations are episodes when breathing symptoms become much worse and require extra treatment. Without treatment, COPD exacerbations can be life threatening, and frequent COPD exacerbations can cause further damage to your lungs. CAUSES   Respiratory infections.   Exposure to smoke.   Exposure to air pollution, chemical fumes, or dust. Sometimes there is no apparent cause or trigger. RISK FACTORS  Smoking cigarettes.  Older age.  Frequent prior COPD exacerbations. SIGNS AND SYMPTOMS   Increased coughing.   Increased thick spit (sputum) production.   Increased wheezing.   Increased shortness of breath.   Rapid breathing.   Chest tightness. DIAGNOSIS  Your medical history, a physical exam, and tests will help your health care provider make a diagnosis. Tests may include:  A chest X-ray.  Basic lab tests.  Sputum testing.  An arterial blood gas test. TREATMENT  Depending on the severity of your COPD exacerbation, you may need to be admitted to a hospital for treatment. Some of the treatments commonly used to treat COPD exacerbations are:   Antibiotic medicines.   Bronchodilators. These are drugs that expand the air passages. They may be given with an inhaler or nebulizer. Spacer devices may be needed to help improve drug delivery.  Corticosteroid medicines.  Supplemental oxygen therapy.  HOME CARE INSTRUCTIONS   Do not smoke. Quitting  smoking is very important to prevent COPD from getting worse and exacerbations from happening as often.  Avoid exposure to all substances that irritate the airway, especially to tobacco smoke.   If you were prescribed an antibiotic medicine, finish it all even if you start to feel better.  Take all medicines as directed by your health care provider.It is important to use correct technique with inhaled medicines.  Drink enough fluids to keep your urine clear or pale yellow (unless you have a medical condition that requires fluid restriction).  Use a cool mist vaporizer. This makes it easier to clear your chest when you cough.   If you have a home nebulizer and oxygen, continue to use them as directed.   Maintain all necessary vaccinations to prevent infections.   Exercise regularly.   Eat a healthy diet.   Keep all follow-up appointments as directed by your health care provider. SEEK IMMEDIATE MEDICAL CARE IF:  You have worsening shortness of breath.   You have trouble talking.   You have severe chest pain.  You have blood in your sputum.  You have a fever.  You have weakness, vomit repeatedly, or faint.   You feel confused.   You continue to get worse. MAKE SURE YOU:   Understand these instructions.  Will watch your condition.  Will get help right away if you are not doing well or get worse. Document Released: 08/13/2007 Document Revised: 03/02/2014 Document Reviewed: 06/20/2013 [facility] Patient Information 2015 [place], [place]. This information is not intended to replace advice given to you by your health care provider. Make sure you discuss any questions you have with your health care provider.

## 2015-05-03 NOTE — ED Notes (Signed)
MD Delo at bedside. 

## 2015-05-03 NOTE — ED Notes (Signed)
Pt reports SOB, headache, productive coughing with thick "milky looking" sputum.

## 2015-06-01 ENCOUNTER — Other Ambulatory Visit: Payer: Self-pay | Admitting: Adult Health

## 2015-06-29 ENCOUNTER — Encounter: Payer: Self-pay | Admitting: Internal Medicine

## 2015-06-29 ENCOUNTER — Ambulatory Visit (INDEPENDENT_AMBULATORY_CARE_PROVIDER_SITE_OTHER): Payer: Medicare HMO | Admitting: Internal Medicine

## 2015-06-29 ENCOUNTER — Other Ambulatory Visit: Payer: Self-pay

## 2015-06-29 VITALS — BP 175/92 | HR 70 | Temp 97.5°F | Ht 66.0 in | Wt 215.4 lb

## 2015-06-29 DIAGNOSIS — K219 Gastro-esophageal reflux disease without esophagitis: Secondary | ICD-10-CM

## 2015-06-29 DIAGNOSIS — Z1211 Encounter for screening for malignant neoplasm of colon: Secondary | ICD-10-CM | POA: Diagnosis not present

## 2015-06-29 DIAGNOSIS — R1314 Dysphagia, pharyngoesophageal phase: Secondary | ICD-10-CM

## 2015-06-29 DIAGNOSIS — Z1212 Encounter for screening for malignant neoplasm of rectum: Secondary | ICD-10-CM

## 2015-06-29 MED ORDER — NA SULFATE-K SULFATE-MG SULF 17.5-3.13-1.6 GM/177ML PO SOLN: 1.0000 | ORAL | Status: DC

## 2015-06-29 NOTE — Patient Instructions (Signed)
Schedule diagnostic EGD with esophageal dilation (dysphagia), upper abdominal pain and screening colonoscopy  Split Movie prep  Continue Protonix 40 mg daily for now  Further recommendations to follow

## 2015-06-29 NOTE — Progress Notes (Signed)
Primary Care Physician:  Lanette Hampshire, MD Primary Gastroenterologist:  Dr. Gala Romney  Pre-Procedure History & Physical: HPI:  Trevor Berg is a 64 y.o. male here for further evaluation of recent esophageal dysphagia symptoms. Patient notes over the past couple months insidiously progressive esophageal dysphagia to solids and pills. GERD symptoms breaking through on Protonix 40 mg daily has been taking Dexilant intermittently as well. Long time smoker. Heavy drinker until a few years ago when he completely stopped. EGD 2-1/2 years ago demonstrated normal esophagus but dysphagia symptoms responded at that time nicely to passage 56 [place] Maloney dilator. Also complains of chronic constipation. Has used MiraLax but does not feel it works very well.  No rectal bleeding. Negative colonoscopy for average risk screening 2005; he is due for average risk screening colonoscopy at this time. He was treated for H. Pylori infection 2014. He has gained 7 pounds since his last office visit.  Past Medical History  Diagnosis Date  . Hypertension   . Hyperlipidemia   . Anxiety   . Gastroesophageal reflux disease     possible small Schatzki's ring; esophageal dilatation in 2005  . COPD (chronic obstructive pulmonary disease)     Chronic bronchitis; 100-pack-year cigarette consumption  . Chest pain 2005    noncritical coronary disease in 2005: 40% distal RCA, 30% CX, 50% OM 2, EF-60%,  . Overweight(278.02)   . Tobacco abuse     100 pack years  . H. pylori infection 2014    treated with prevpac  . Anginal pain     Past Surgical History  Procedure Laterality Date  . Cervical spine surgery      C5-6 and C6-7: Relief of spinal stenosis with corpectomy, foraminotomy and fusion  . Orif ankle fracture      right  . Tonsillectomy    . Esophagogastroduodenoscopy  09/27/2004    RMR:   A couple of tiny, distal esophageal erosions, otherwise normal esophagus aside from a Schatzki ring status post Maloney  dilation as described above/ Small hiatal hernia, otherwise normal stomach, normal D1-D2  . Colonoscopy  10/19/2004    RMR:   Friable anal canal, likely source of patient's hematochezia/  Otherwise normal rectum, normal colon.  . Orif tibia fracture      Left  . Esophagogastroduodenoscopy (egd) with esophageal dilation N/A 03/19/2013    [id number] esophagus s/p dilation/Abnormal stomach of uncertain clinical significance-s/p bx positive for H. pylori, status post Prevpac treatment    Prior to Admission medications   Medication Sig Start Date End Date Taking? Authorizing Provider  albuterol (PROVENTIL HFA;VENTOLIN HFA) 108 (90 BASE) MCG/ACT inhaler Inhale 2 puffs into the lungs every 6 (six) hours as needed. Shortness of Breath   Yes Historical Provider, MD  amLODipine (NORVASC) 5 MG tablet TAKE ONE TABLET BY MOUTH ONCE DAILY 06/01/15  Yes Arnoldo Lenis, MD  ANORO ELLIPTA 62.5-25 MCG/INH AEPB Inhale 1 puff into the lungs daily.  03/08/15  Yes Historical Provider, MD  aspirin EC 81 MG tablet Take 81 mg by mouth daily.   Yes Historical Provider, MD  atenolol (TENORMIN) 50 MG tablet TAKE ONE TABLET BY MOUTH ONCE DAILY IN THE MORNING 06/01/15  Yes Arnoldo Lenis, MD  atorvastatin (LIPITOR) 20 MG tablet Take 1 tablet by mouth daily. Take with 40 mg tablet to equal 60 mg. 02/09/15  Yes Historical Provider, MD  atorvastatin (LIPITOR) 40 MG tablet Take 40 mg by mouth daily. Takes with a 20 mg tablet to equal dose  of 60 mg daily   Yes Historical Provider, MD  DEXILANT 60 MG capsule Take 60 mg by mouth daily.  01/28/14  Yes Historical Provider, MD  ibuprofen (ADVIL,MOTRIN) 800 MG tablet Take 1 tablet by mouth 3 (three) times daily as needed for moderate pain.  04/26/15  Yes Historical Provider, MD  lisinopril (PRINIVIL,ZESTRIL) 20 MG tablet  06/08/15  Yes Historical Provider, MD  LORazepam (ATIVAN) 1 MG tablet Take 1 mg by mouth every 4 (four) hours as needed. For nerves   Yes Historical Provider, MD    nitroGLYCERIN (NITROSTAT) 0.4 MG SL tablet Place 0.4 mg under the tongue every 5 (five) minutes as needed (used today 06/29/2015). Chest Pain   Yes Historical Provider, MD  pantoprazole (PROTONIX) 40 MG tablet Take 1 tablet (40 mg total) by mouth daily. 06/29/14  Yes Orvil Feil, NP  polyethylene glycol powder (GLYCOLAX/MIRALAX) powder Use 1 capful daily as needed for constipation Patient taking differently: Take 1 Container by mouth daily.  10/06/14  Yes Orvil Feil, NP  Pseudoephedrine-Guaifenesin (MUCINEX D) [id number] MG TB12 Take 1 tablet by mouth daily.    Yes Historical Provider, MD  azithromycin (ZITHROMAX Z-PAK) 250 MG tablet 2 po day one, then 1 daily x 4 days Patient not taking: Reported on 06/29/2015 05/03/15   Veryl Speak, MD  HYDROcodone-acetaminophen (NORCO/VICODIN) 5-325 MG per tablet Take 2 tablets by mouth every 4 (four) hours as needed for moderate pain. Patient not taking: Reported on 06/29/2015 11/02/13   Orpah Greek, MD  predniSONE (DELTASONE) 10 MG tablet Take 2 tablets (20 mg total) by mouth 2 (two) times daily. Patient not taking: Reported on 06/29/2015 05/03/15   Veryl Speak, MD    Allergies as of 06/29/2015 - Review Complete 06/29/2015  Allergen Reaction Noted  . Chantix [varenicline]  02/23/2014  . Tomato Rash 06/16/2011    Family History  Problem Relation Age of Onset  . Heart attack Mother     deceased, age 51s  . Ulcers Brother   . Diabetes Brother   . Colon cancer Neg Hx   . Hypertension      Social History   Social History  . Marital Status: Married    Spouse Name: N/A  . Number of Children: 3  . Years of Education: N/A   Occupational History  . disability    Social History Main Topics  . Smoking status: Current Every Day Smoker -- 1.50 packs/day for 50 years    Types: Cigarettes  . Smokeless tobacco: Never Used     Comment: 1 pack per day as of 08/2012  . Alcohol Use: No     Comment: occasionally, usually holidays but sometimes on  weekends, prior heavy use  . Drug Use: No  . Sexual Activity: Yes   Other Topics Concern  . Not on file   Social History Narrative    Review of Systems: See HPI, otherwise negative ROS  Physical Exam: BP 175/92 mmHg  Pulse 70  Temp(Src) 97.5 F (36.4 C)  Ht 5\' 6"  (1.676 m)  Wt 215 lb 6.4 oz (97.705 kg)  BMI 34.78 kg/m2 General:   Alert,   pleasant and cooperative in NAD - accompanied by his wife. Skin:  Intact without significant lesions or rashes. Eyes:  Sclera clear, no icterus.   Conjunctiva pink. Lungs:  Clear throughout to auscultation.   No wheezes, crackles, or rhonchi. No acute distress. Heart:  Regular rate and rhythm; no murmurs, clicks, rubs,  or gallops. Abdomen: Non-distended,  normal bowel sounds.  Soft and nontender without appreciable mass or hepatosplenomegaly.  Pulses:  Normal pulses noted. Extremities:  Without clubbing or edema.  Impression:  Pleasant 64 year old gentleman with long-standing GERD now with recurrent esophageal dysphagia. Normal esophagus a little over 2 years ago-Maloney dilation empirically at that time produced fairly long-lasting improvement until recently.  Positive alcohol and tobacco history. GERD symptoms fairly poorly controlled at this time. He needs further evaluation of his dysphagia. Chronic constipation.  He is due for average risk screening colonoscopy   Recommendations:  Schedule diagnostic EGD with esophageal dilation (dysphagia), upper abdominal pain and screening colonoscopy.  The risks, benefits, limitations, imponderables and alternatives regarding both EGD and colonoscopy have been reviewed with the patient. Questions have been answered. All parties agreeable.   Split Movie prep  Continue Protonix 40 mg daily for now  Further recommendations to follow   Notice: This dictation was prepared with Dragon dictation along with smaller phrase technology. Any transcriptional errors that result from this process are  unintentional and may not be corrected upon review.

## 2015-07-26 ENCOUNTER — Other Ambulatory Visit: Payer: Self-pay

## 2015-07-26 ENCOUNTER — Encounter (HOSPITAL_COMMUNITY)
Admission: RE | Disposition: A | Discharge status: Home / Self Care | Payer: Self-pay | Source: Ambulatory Visit | Attending: Emergency Medicine

## 2015-07-26 ENCOUNTER — Encounter (HOSPITAL_COMMUNITY): Payer: Self-pay

## 2015-07-26 ENCOUNTER — Ambulatory Visit (HOSPITAL_COMMUNITY): Payer: Medicare HMO

## 2015-07-26 ENCOUNTER — Emergency Department (HOSPITAL_COMMUNITY)
Admission: RE | Admit: 2015-07-26 | Discharge: 2015-07-26 | Disposition: A | Discharge status: Home / Self Care | Payer: Medicare HMO | Source: Ambulatory Visit | Attending: Emergency Medicine | Admitting: Emergency Medicine

## 2015-07-26 ENCOUNTER — Encounter (HOSPITAL_COMMUNITY): Payer: Self-pay | Admitting: Emergency Medicine

## 2015-07-26 ENCOUNTER — Emergency Department (HOSPITAL_COMMUNITY)
Admission: EM | Admit: 2015-07-26 | Discharge: 2015-07-26 | Disposition: A | Discharge status: Home / Self Care | Payer: Medicare HMO

## 2015-07-26 DIAGNOSIS — Z720 Tobacco use: Secondary | ICD-10-CM | POA: Insufficient documentation

## 2015-07-26 DIAGNOSIS — I209 Angina pectoris, unspecified: Secondary | ICD-10-CM | POA: Diagnosis not present

## 2015-07-26 DIAGNOSIS — Z79899 Other long term (current) drug therapy: Secondary | ICD-10-CM | POA: Insufficient documentation

## 2015-07-26 DIAGNOSIS — E785 Hyperlipidemia, unspecified: Secondary | ICD-10-CM | POA: Diagnosis not present

## 2015-07-26 DIAGNOSIS — E663 Overweight: Secondary | ICD-10-CM | POA: Insufficient documentation

## 2015-07-26 DIAGNOSIS — R079 Chest pain, unspecified: Secondary | ICD-10-CM | POA: Diagnosis present

## 2015-07-26 DIAGNOSIS — J441 Chronic obstructive pulmonary disease with (acute) exacerbation: Secondary | ICD-10-CM | POA: Diagnosis not present

## 2015-07-26 DIAGNOSIS — Z8619 Personal history of other infectious and parasitic diseases: Secondary | ICD-10-CM | POA: Insufficient documentation

## 2015-07-26 DIAGNOSIS — K219 Gastro-esophageal reflux disease without esophagitis: Secondary | ICD-10-CM | POA: Insufficient documentation

## 2015-07-26 DIAGNOSIS — F419 Anxiety disorder, unspecified: Secondary | ICD-10-CM | POA: Diagnosis not present

## 2015-07-26 DIAGNOSIS — Z7982 Long term (current) use of aspirin: Secondary | ICD-10-CM | POA: Diagnosis not present

## 2015-07-26 DIAGNOSIS — I10 Essential (primary) hypertension: Secondary | ICD-10-CM | POA: Diagnosis not present

## 2015-07-26 LAB — CBC WITH DIFFERENTIAL/PLATELET
Basophils Absolute: 0.1 10*3/uL (ref 0.0–0.1)
Basophils Relative: 1 %
Eosinophils Absolute: 0.1 10*3/uL (ref 0.0–0.7)
Eosinophils Relative: 1 %
HCT: 46.2 % (ref 39.0–52.0)
Hemoglobin: 15.5 g/dL (ref 13.0–17.0)
Lymphocytes Relative: 34 %
Lymphs Abs: 2.5 10*3/uL (ref 0.7–4.0)
MCH: 31.7 pg (ref 26.0–34.0)
MCHC: 33.5 g/dL (ref 30.0–36.0)
MCV: 94.5 fL (ref 78.0–100.0)
Monocytes Absolute: 0.6 10*3/uL (ref 0.1–1.0)
Monocytes Relative: 9 %
Neutro Abs: 4.1 10*3/uL (ref 1.7–7.7)
Neutrophils Relative %: 55 %
Platelets: 206 10*3/uL (ref 150–400)
RBC: 4.89 MIL/uL (ref 4.22–5.81)
RDW: 13.1 % (ref 11.5–15.5)
WBC: 7.3 10*3/uL (ref 4.0–10.5)

## 2015-07-26 LAB — COMPREHENSIVE METABOLIC PANEL
ALT: 24 U/L (ref 17–63)
AST: 25 U/L (ref 15–41)
Albumin: 4.5 g/dL (ref 3.5–5.0)
Alkaline Phosphatase: 76 U/L (ref 38–126)
Anion gap: 9 (ref 5–15)
BUN: 9 mg/dL (ref 6–20)
CO2: 24 mmol/L (ref 22–32)
Calcium: 8.9 mg/dL (ref 8.9–10.3)
Chloride: 107 mmol/L (ref 101–111)
Creatinine, Ser: 0.9 mg/dL (ref 0.61–1.24)
GFR calc Af Amer: >60 mL/min (ref >60)
GFR calc non Af Amer: >60 mL/min (ref >60)
Glucose, Bld: 98 mg/dL (ref 65–99)
Potassium: 4.3 mmol/L (ref 3.5–5.1)
Sodium: 140 mmol/L (ref 135–145)
Total Bilirubin: 0.6 mg/dL (ref 0.3–1.2)
Total Protein: 7.3 g/dL (ref 6.5–8.1)

## 2015-07-26 LAB — TROPONIN I: Troponin I: <0.03 ng/mL (ref <0.031)

## 2015-07-26 SURGERY — EGD (ESOPHAGOGASTRODUODENOSCOPY)
Anesthesia: Moderate Sedation

## 2015-07-26 NOTE — Discharge Instructions (Signed)
Follow up with your heart md to get cleared for colonoscopy and upper endoscopy
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
## 2015-07-26 NOTE — ED Notes (Signed)
Pt says he told 2 NTG yesterday for chest pain, took first NTG at 0730 and second about 0900 with mild relief.

## 2015-07-26 NOTE — ED Provider Notes (Signed)
CSN: [id number]     Arrival date & time 07/26/15  1157 History  This chart was scribed for Milton Ferguson, MD by Eustaquio Maize, ED Scribe. This patient was seen in room APA15/APA15 and the patient's care was started at 12:34 PM.  Chief Complaint  Patient presents with  . Chest Pain  . Shortness of Breath   Patient is a 64 y.o. male presenting with chest pain. The history is provided by the patient. No language interpreter was used.  Chest Pain Pain location:  Substernal area Pain radiates to:  Does not radiate Pain radiates to the back: no   Pain severity:  Moderate Timing:  Constant Progression:  Unchanged Chronicity:  Chronic Associated symptoms: shortness of breath   Associated symptoms: no abdominal pain, no back pain, no cough, no fatigue and no headache   Risk factors: high cholesterol, hypertension, male sex and smoking     HPI Comments: Trevor Berg is a 64 y.o. male with hx HTN, HLD, COPD who presents to the Emergency Department complaining of chronic chest pain and shortness of breath x "years."  Pt was scheduled for a colonoscopy and esophageal dilation today with Dr. Gala Romney but was sent here for further evaluation when he told Dr. Gala Romney that he was having chest pain. Pt usually takes 2-3 NTG per day for his chest pain. He has never had a catheterization in the past.    Past Medical History  Diagnosis Date  . Hypertension   . Hyperlipidemia   . Anxiety   . Gastroesophageal reflux disease     possible small Schatzki's ring; esophageal dilatation in 2005  . COPD (chronic obstructive pulmonary disease)     Chronic bronchitis; 100-pack-year cigarette consumption  . Chest pain 2005    noncritical coronary disease in 2005: 40% distal RCA, 30% CX, 50% OM 2, EF-60%,  . Overweight(278.02)   . Tobacco abuse     100 pack years  . H. pylori infection 2014    treated with prevpac  . Anginal pain    Past Surgical History  Procedure Laterality Date  . Cervical spine surgery       C5-6 and C6-7: Relief of spinal stenosis with corpectomy, foraminotomy and fusion  . Orif ankle fracture      right  . Tonsillectomy    . Esophagogastroduodenoscopy  09/27/2004    RMR:   A couple of tiny, distal esophageal erosions, otherwise normal esophagus aside from a Schatzki ring status post Maloney dilation as described above/ Small hiatal hernia, otherwise normal stomach, normal D1-D2  . Colonoscopy  10/19/2004    RMR:   Friable anal canal, likely source of patient's hematochezia/  Otherwise normal rectum, normal colon.  . Orif tibia fracture      Left  . Esophagogastroduodenoscopy (egd) with esophageal dilation N/A 03/19/2013    IRW:ERXVQM esophagus s/p dilation/Abnormal stomach of uncertain clinical significance-s/p bx positive for H. pylori, status post Prevpac treatment   Family History  Problem Relation Age of Onset  . Heart attack Mother     deceased, age 67s  . Ulcers Brother   . Diabetes Brother   . Colon cancer Neg Hx   . Hypertension     Social History  Substance Use Topics  . Smoking status: Current Every Day Smoker -- 1.50 packs/day for 50 years    Types: Cigarettes  . Smokeless tobacco: Never Used     Comment: 1 pack per day as of 08/2012  . Alcohol Use: No  Comment: occasionally, usually holidays but sometimes on weekends, prior heavy use    Review of Systems  Constitutional: Negative for appetite change and fatigue.  HENT: Negative for congestion, ear discharge and sinus pressure.   Eyes: Negative for discharge.  Respiratory: Positive for shortness of breath. Negative for cough.   Cardiovascular: Negative for chest pain.  Gastrointestinal: Negative for abdominal pain and diarrhea.  Genitourinary: Negative for frequency and hematuria.  Musculoskeletal: Negative for back pain.  Skin: Negative for rash.  Neurological: Negative for seizures and headaches.  Psychiatric/Behavioral: Negative for hallucinations.      Allergies  Chantix and  Tomato  Home Medications   Prior to Admission medications   Medication Sig Start Date End Date Taking? Authorizing Provider  albuterol (PROVENTIL HFA;VENTOLIN HFA) 108 (90 BASE) MCG/ACT inhaler Inhale 2 puffs into the lungs every 6 (six) hours as needed. Shortness of Breath   Yes Historical Provider, MD  amLODipine (NORVASC) 5 MG tablet TAKE ONE TABLET BY MOUTH ONCE DAILY 06/01/15  Yes Arnoldo Lenis, MD  ANORO ELLIPTA 62.5-25 MCG/INH AEPB Inhale 1 puff into the lungs daily.  03/08/15  Yes Historical Provider, MD  aspirin EC 81 MG tablet Take 81 mg by mouth daily.   Yes Historical Provider, MD  atenolol (TENORMIN) 50 MG tablet TAKE ONE TABLET BY MOUTH ONCE DAILY IN THE MORNING 06/01/15  Yes Arnoldo Lenis, MD  atorvastatin (LIPITOR) 20 MG tablet Take 1 tablet by mouth daily. Take with 40 mg tablet to equal 60 mg. 02/09/15  Yes Historical Provider, MD  atorvastatin (LIPITOR) 40 MG tablet Take 40 mg by mouth daily. Takes with a 20 mg tablet to equal dose of 60 mg daily   Yes Historical Provider, MD  DEXILANT 60 MG capsule Take 60 mg by mouth daily.  01/28/14  Yes Historical Provider, MD  ibuprofen (ADVIL,MOTRIN) 800 MG tablet Take 1 tablet by mouth 3 (three) times daily as needed for moderate pain.  04/26/15  Yes Historical Provider, MD  lisinopril (PRINIVIL,ZESTRIL) 20 MG tablet Take 20 mg by mouth daily.  06/08/15  Yes Historical Provider, MD  LORazepam (ATIVAN) 1 MG tablet Take 1 mg by mouth every 4 (four) hours as needed. For nerves   Yes Historical Provider, MD  Na Sulfate-K Sulfate-Mg Sulf (SUPREP BOWEL PREP) SOLN Take 1 kit by mouth as directed. 06/29/15  Yes Daneil Dolin, MD  nitroGLYCERIN (NITROSTAT) 0.4 MG SL tablet Place 0.4 mg under the tongue every 5 (five) minutes as needed (used today 06/29/2015). Chest Pain   Yes Historical Provider, MD  pantoprazole (PROTONIX) 40 MG tablet Take 1 tablet (40 mg total) by mouth daily. 06/29/14  Yes Orvil Feil, NP  polyethylene glycol powder  (GLYCOLAX/MIRALAX) powder Use 1 capful daily as needed for constipation Patient taking differently: Take 1 Container by mouth daily.  10/06/14  Yes Orvil Feil, NP  Pseudoephedrine-Guaifenesin (MUCINEX D) [phone] MG TB12 Take 1 tablet by mouth daily.    Yes Historical Provider, MD   BP 151/85 mmHg  Pulse 64  Resp 12  SpO2 94%   Physical Exam  Constitutional: He is oriented to person, place, and time. He appears well-developed.  HENT:  Head: Normocephalic.  Eyes: Conjunctivae and EOM are normal. No scleral icterus.  Neck: Neck supple. No thyromegaly present.  Cardiovascular: Normal rate and regular rhythm.  Exam reveals no gallop and no friction rub.   No murmur heard. Pulmonary/Chest: No stridor. He has no wheezes. He has no rales. He exhibits  no tenderness.  Abdominal: He exhibits no distension. There is no tenderness. There is no rebound.  Musculoskeletal: Normal range of motion. He exhibits no edema.  Lymphadenopathy:    He has no cervical adenopathy.  Neurological: He is oriented to person, place, and time. He exhibits normal muscle tone. Coordination normal.  Skin: No rash noted. No erythema.  Psychiatric: He has a normal mood and affect. His behavior is normal.    ED Course  Procedures (including critical care time)  DIAGNOSTIC STUDIES: Oxygen Saturation is 94% on RA, adequate by my interpretation.    COORDINATION OF CARE: 12:41 PM-Discussed treatment plan which includes CXR, CBC, CMP, Troponin with pt at bedside and pt agreed to plan.   Labs Review Labs Reviewed  CBC WITH DIFFERENTIAL/PLATELET  COMPREHENSIVE METABOLIC PANEL  TROPONIN I    Imaging Review No results found. I have personally reviewed and evaluated these images and lab results as part of my medical decision-making.   EKG Interpretation None      MDM   Final diagnoses:  None    This pain resolved labs and EKG unremarkable. I spoke with cardiology and they will feel follow-up with the  patient in 1-2 weeks. At that time they will possibly clear him to get colonoscopy and upper endoscopy done. I called Dr. Sydell Axon in total plan.j The chart was scribed for me under my direct supervision.  I personally performed the history, physical, and medical decision making and all procedures in the evaluation of this patient.Milton Ferguson, MD 07/26/15 [phone]

## 2015-07-26 NOTE — OR Nursing (Signed)
Patient arrived for procedure and during assessment, patient stated he had been having chest pain since yesterday and had taken a Nitroglycerin yesterday without relief. Patient complained of chest pain and shortness of breath after walking in from parking lot to Short Stay. Dr. Gala Romney notified and ordered for patient to go to the emergency room. Patient transported to the emergency room without incident.

## 2015-07-28 ENCOUNTER — Encounter: Payer: Self-pay | Admitting: Physician Assistant

## 2015-07-28 ENCOUNTER — Ambulatory Visit (INDEPENDENT_AMBULATORY_CARE_PROVIDER_SITE_OTHER): Payer: Medicare HMO | Admitting: Physician Assistant

## 2015-07-28 ENCOUNTER — Encounter: Payer: Self-pay | Admitting: *Deleted

## 2015-07-28 VITALS — BP 164/84 | HR 75 | Ht 66.0 in | Wt 213.0 lb

## 2015-07-28 DIAGNOSIS — R0789 Other chest pain: Secondary | ICD-10-CM | POA: Diagnosis not present

## 2015-07-28 DIAGNOSIS — R072 Precordial pain: Secondary | ICD-10-CM

## 2015-07-28 DIAGNOSIS — Z720 Tobacco use: Secondary | ICD-10-CM | POA: Diagnosis not present

## 2015-07-28 DIAGNOSIS — I10 Essential (primary) hypertension: Secondary | ICD-10-CM

## 2015-07-28 MED ORDER — LISINOPRIL 40 MG PO TABS: 40.0000 mg | ORAL_TABLET | Freq: Every day | ORAL | Status: DC

## 2015-07-28 NOTE — Assessment & Plan Note (Signed)
Patient has atypical chest pain that is constant and sharp shooting. 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 Negative stress echo in 2013. He continues to smoke heavily , has uncontrolled hypertension and hyperlipidemia as well as strong family history of CAD. His endoscopy and colonoscopy were canceled because of chest pain. We'll proceed with Lexi scan Myoview to rule out ischemia. Follow-up with Dr. Harl Bowie.

## 2015-07-28 NOTE — Assessment & Plan Note (Signed)
Smoking cessation discussed with patient. 

## 2015-07-28 NOTE — Patient Instructions (Signed)
Your physician recommends that you schedule a follow-up appointment in: 1 Month with Dr. Harl Bowie  Your physician has recommended you make the following change in your medication:   Increase Lisinopril to 40 mg Daily   Your physician has requested that you have a lexiscan myoview. For further information please visit [email or web address]. Please follow instruction sheet, as given.  Thank you for choosing [facility]!

## 2015-07-28 NOTE — Assessment & Plan Note (Signed)
Blood pressure has been elevated. Recommend  Increasing lisinopril to 40 mg daily.

## 2015-07-28 NOTE — Progress Notes (Signed)
Cardiology Office Note   Date:  07/28/2015   ID:  Trevor Berg, DOB 10-07-1951, MRN [id number]  PCP:  Trevor Hampshire, MD  Cardiologist:   Dr. Harl Berg  Chief Complaint:    History of Present Illness: Trevor Berg is a 64 y.o. male who presents for  Follow-up of chest pain. He was scheduled for colonoscopy and endoscopy with Dr. Buford Berg but was sent to the emergency room because of chest pain. Chest pain has been chronic and the patient takes 2-3 nitroglycerin's a day for this. troponins were negative, EKG unchanged and all other labs stable.  Patient describes his chest pain as sharp across his upper abdomen and lower chest. It is there almost constantly. Nitroglycerin eases it some but it never totally goes away. He also has some chest tightness when he tries to walk. Sometimes he has shoulder pain when he is laying down. He can  Only walk very short distances because of his COPD and knee problems. He has chronic dyspnea on exertion. He was smoking 2 packs of cigarettes daily but is down to 1 pack per day. He is not a diabetic. His blood pressure has been elevated. His mother died of an MI in her 64s.   He has a history of CAD status post cath in 2005 with mild to moderate nonobstructive disease. Negative stress echo 09/2012 EF 55-60%. He saw Dr. Harl Berg 02/2015 complaining of sharp aching pain occurring at rest or with exertion and could last up to an hour. He also had dyspnea on exertion walking 2 blocks which is stable. His chest pain was felt to be atypical and no changes were made. Patient also has hypertension, hyperlipidemia and COPD.  Past Medical History  Diagnosis Date  . Hypertension   . Hyperlipidemia   . Anxiety   . Gastroesophageal reflux disease     possible small Schatzki's ring; esophageal dilatation in 2005  . COPD (chronic obstructive pulmonary disease)     Chronic bronchitis; 100-pack-year cigarette consumption  . Chest pain 2005    noncritical coronary disease in 2005:  40% distal RCA, 30% CX, 50% OM 2, EF-60%,  . Overweight(278.02)   . Tobacco abuse     100 pack years  . H. pylori infection 2014    treated with prevpac  . Anginal pain     Past Surgical History  Procedure Laterality Date  . Cervical spine surgery      C5-6 and C6-7: Relief of spinal stenosis with corpectomy, foraminotomy and fusion  . Orif ankle fracture      right  . Tonsillectomy    . Esophagogastroduodenoscopy  09/27/2004    RMR:   A couple of tiny, distal esophageal erosions, otherwise normal esophagus aside from a Schatzki ring status post Maloney dilation as described above/ Small hiatal hernia, otherwise normal stomach, normal D1-D2  . Colonoscopy  10/19/2004    RMR:   Friable anal canal, likely source of patient's hematochezia/  Otherwise normal rectum, normal colon.  . Orif tibia fracture      Left  . Esophagogastroduodenoscopy (egd) with esophageal dilation N/A 03/19/2013    YDX:AJOINO esophagus s/p dilation/Abnormal stomach of uncertain clinical significance-s/p bx positive for H. pylori, status post Prevpac treatment     Current Outpatient Prescriptions  Medication Sig Dispense Refill  . albuterol (PROVENTIL HFA;VENTOLIN HFA) 108 (90 BASE) MCG/ACT inhaler Inhale 2 puffs into the lungs every 6 (six) hours as needed. Shortness of Breath    . ANORO ELLIPTA 62.5-25 MCG/INH AEPB  Inhale 1 puff into the lungs daily.     Marland Kitchen aspirin EC 81 MG tablet Take 81 mg by mouth daily.    Marland Kitchen atenolol (TENORMIN) 50 MG tablet TAKE ONE TABLET BY MOUTH ONCE DAILY IN THE MORNING 90 tablet 4  . atorvastatin (LIPITOR) 20 MG tablet Take 1 tablet by mouth daily. Take with 40 mg tablet to equal 60 mg.    . atorvastatin (LIPITOR) 40 MG tablet Take 40 mg by mouth daily. Takes with a 20 mg tablet to equal dose of 60 mg daily    . DEXILANT 60 MG capsule Take 60 mg by mouth daily.     Marland Kitchen ibuprofen (ADVIL,MOTRIN) 800 MG tablet Take 1 tablet by mouth 3 (three) times daily as needed for moderate pain.     Marland Kitchen  LORazepam (ATIVAN) 1 MG tablet Take 1 mg by mouth every 4 (four) hours as needed. For nerves    . Na Sulfate-K Sulfate-Mg Sulf (SUPREP BOWEL PREP) SOLN Take 1 kit by mouth as directed. 1 Bottle 0  . nitroGLYCERIN (NITROSTAT) 0.4 MG SL tablet Place 0.4 mg under the tongue every 5 (five) minutes as needed (used today 06/29/2015). Chest Pain    . pantoprazole (PROTONIX) 40 MG tablet Take 1 tablet (40 mg total) by mouth daily. 90 tablet 3  . polyethylene glycol powder (GLYCOLAX/MIRALAX) powder Use 1 capful daily as needed for constipation (Patient taking differently: Take 1 Container by mouth daily. ) 225 g 3  . Pseudoephedrine-Guaifenesin (MUCINEX D) [phone] MG TB12 Take 1 tablet by mouth daily.     . [DISCONTINUED] lisinopril (PRINIVIL,ZESTRIL) 20 MG tablet Take 20 mg by mouth daily.      No current facility-administered medications for this visit.    Allergies:   Chantix and Tomato    Social History:  The patient  reports that he has been smoking Cigarettes.  He has a 75 pack-year smoking history. He has never used smokeless tobacco. He reports that he does not drink alcohol or use illicit drugs.   Family History:  The patient's    family history includes Diabetes in his brother; Heart attack in his mother; Hypertension in an other family member; Ulcers in his brother. There is no history of Colon cancer.    ROS:  Please see the history of present illness.   Otherwise, review of systems are positive for none.   All other systems are reviewed and negative.    PHYSICAL EXAM: VS:  BP 164/84 mmHg  Pulse 75  Ht 5' 6" (1.676 m)  Wt 213 lb (96.616 kg)  BMI 34.40 kg/m2  SpO2 95% , BMI Body mass index is 34.4 kg/(m^2). GEN: Well nourished, well developed, in no acute distress , smells of cigarette smoke Neck: no JVD, HJR, carotid bruits, or masses Cardiac: RRR;  Positive S4,no murmurs, rubs, thrill or heave,  Respiratory:   Decreased breath sounds without wheezing GI: soft, nontender,  nondistended, + BS MS: no deformity or atrophy Extremities: without cyanosis, clubbing, edema, good distal pulses bilaterally.  Skin: warm and dry, no rash Neuro:  Strength and sensation are intact    EKG:  EKG is not ordered today.  EKG reviewed from emergency room and shows normal sinus rhythm without acute change   Recent Labs: 05/03/2015: B Natriuretic Peptide 15.0 07/26/2015: ALT 24; BUN 9; Creatinine, Ser 0.90; Hemoglobin 15.5; Platelets 206; Potassium 4.3; Sodium 140    Lipid Panel No results found for: CHOL, TRIG, HDL, CHOLHDL, VLDL, LDLCALC, LDLDIRECT  Wt Readings from Last 3 Encounters:  07/28/15 213 lb (96.616 kg)  07/26/15 215 lb (97.523 kg)  06/29/15 215 lb 6.4 oz (97.705 kg)      Other studies Reviewed: Additional studies/ records that were reviewed today include and review of the records demonstrates:    09/2012 Stress echo Study Conclusions  - Stress: There was resting hypertension with a hypertensive   response to stress. Atypical chest pain was present before   and during exercise without increase in intensity, and it   persisted following conclusion of the test. - Stress ECG conclusions: No diagnostic ST abnormalities or   arrhythmias. The stress ECG was negative for ischemia.   [facility] scoring: exercise time of 44min; maximum ST deviation   of 53mm; angina present but did not limit exercise;   resulting score is -1. This score predicts a moderate risk   of cardiac events. - Staged echo: There was no echocardiographic evidence for   stress-induced ischemia. - Peak stress: LV global systolic function was vigorous. The   estimated LV ejection fraction was 70% to 75%. No evidence   for new LV regional wall motion abnormalities.  Diagnostic Studies 07/2004 Cath The left main is a large vessel with no significant disease.    The LAD is a medium size vessel which courses three quarters of the way down  the anterior wall and has no significant disease.   There are two small  diagonal branches with no significant disease.    The left circumflex is a large vessel coursing through the AV groove.  The  third obtuse marginal branch is free of the AV groove circumflex and noted  to have a mild 30% narrowing after the takeoff the second OM.    The first OM is a small vessel which bifurcates in the proximal portion with  no significant disease.    The second OM is a large vessel which bifurcates distally and has 50% mid  vessel stenosis.    The third OM is a small vessel with no significant disease.    The right coronary artery is a large vessel which is dominant and gives rise  to both the PDA as well as the posterolateral branch.  The RCA is ectatic  but has no high grade stenosis.    The PDA is a medium size vessel with no significant disease.    The PLA is a large vessel which bifurcates in the mid segment. There is 40%  lesion in the proximal portion fo the lower bifurcation.    Left ventriculogram reveals a preserved ejection fraction of 60%.    Hemodynamics:  The systemic arterial pressure was 112/69.  Left ventricular  pressure was 115/8.  Left ventricular end diastolic pressure was 13.    CONCLUSION:  1.  Non-critical coronary artery disease.  2.  Normal left ventricular systolic function.     ASSESSMENT AND PLAN:  Chest pain  Patient has atypical chest pain that is constant and sharp shooting. 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 Negative stress echo in 2013. He continues to smoke heavily , has uncontrolled hypertension and hyperlipidemia as well as strong family history of CAD. His endoscopy and colonoscopy were canceled because of chest pain. We'll proceed with Lexi scan Myoview to rule out ischemia. Follow-up with Dr. Harl Berg.  Essential hypertension  Blood pressure has been elevated. Recommend  Increasing lisinopril to 40 mg daily.  Tobacco abuse  Smoking cessation discussed with  patient  Sumner Boast, PA-C  07/28/2015 1:19 PM    [facility] [address], [place], [place]  [address] Phone: [phone]; Fax: [phone]

## 2015-08-03 ENCOUNTER — Inpatient Hospital Stay (HOSPITAL_COMMUNITY): Admission: RE | Admit: 2015-08-03 | Payer: Medicare HMO | Source: Ambulatory Visit

## 2015-08-03 ENCOUNTER — Encounter (HOSPITAL_COMMUNITY)
Admission: RE | Admit: 2015-08-03 | Discharge: 2015-08-03 | Disposition: A | Discharge status: Home / Self Care | Payer: Medicare HMO | Source: Ambulatory Visit | Attending: Physician Assistant | Admitting: Physician Assistant

## 2015-08-03 ENCOUNTER — Encounter (HOSPITAL_COMMUNITY): Payer: Self-pay

## 2015-08-03 DIAGNOSIS — R072 Precordial pain: Secondary | ICD-10-CM | POA: Diagnosis not present

## 2015-08-03 HISTORY — DX: Unspecified asthma, uncomplicated: J45.909

## 2015-08-03 LAB — NM MYOCAR MULTI W/SPECT W/WALL MOTION / EF
LV dias vol: 104 mL
LV sys vol: 39 mL
Peak HR: 79 {beats}/min
RATE: 0.31
Rest HR: 60 {beats}/min
SDS: 4
SRS: 0
SSS: 4
TID: 1.01

## 2015-08-03 MED ORDER — TECHNETIUM TC 99M SESTAMIBI GENERIC - CARDIOLITE
30.0000 | Freq: Once | INTRAVENOUS | Status: AC | PRN
  Administered 2015-08-03: 33 via INTRAVENOUS

## 2015-08-03 MED ORDER — SODIUM CHLORIDE 0.9 % IJ SOLN
INTRAMUSCULAR | Status: AC
  Administered 2015-08-03: 10 mL via INTRAVENOUS
  Filled 2015-08-03: qty 3

## 2015-08-03 MED ORDER — SODIUM CHLORIDE 0.9 % IJ SOLN
INTRAMUSCULAR | Status: AC
  Filled 2015-08-03: qty 36

## 2015-08-03 MED ORDER — TECHNETIUM TC 99M SESTAMIBI - CARDIOLITE
10.0000 | Freq: Once | INTRAVENOUS | Status: AC | PRN
  Administered 2015-08-03: 08:00:00 9 via INTRAVENOUS

## 2015-08-03 MED ORDER — REGADENOSON 0.4 MG/5ML IV SOLN
INTRAVENOUS | Status: AC
  Administered 2015-08-03: 0.4 mg via INTRAVENOUS
  Filled 2015-08-03: qty 5

## 2015-08-04 ENCOUNTER — Encounter: Payer: Self-pay | Admitting: Internal Medicine

## 2015-08-04 NOTE — Progress Notes (Signed)
APPOINTMENT MADE °

## 2015-08-31 ENCOUNTER — Encounter: Payer: Self-pay | Admitting: Internal Medicine

## 2015-08-31 ENCOUNTER — Other Ambulatory Visit: Payer: Self-pay

## 2015-08-31 ENCOUNTER — Ambulatory Visit (INDEPENDENT_AMBULATORY_CARE_PROVIDER_SITE_OTHER): Payer: Medicare HMO | Admitting: Internal Medicine

## 2015-08-31 VITALS — BP 157/90 | HR 90 | Temp 98.1°F | Ht 66.0 in | Wt 211.4 lb

## 2015-08-31 DIAGNOSIS — R0789 Other chest pain: Secondary | ICD-10-CM | POA: Diagnosis not present

## 2015-08-31 DIAGNOSIS — Z1211 Encounter for screening for malignant neoplasm of colon: Secondary | ICD-10-CM | POA: Diagnosis not present

## 2015-08-31 DIAGNOSIS — K219 Gastro-esophageal reflux disease without esophagitis: Secondary | ICD-10-CM | POA: Diagnosis not present

## 2015-08-31 DIAGNOSIS — R1310 Dysphagia, unspecified: Secondary | ICD-10-CM

## 2015-08-31 MED ORDER — PEG 3350-KCL-NA BICARB-NACL 420 G PO SOLR: 4000.0000 mL | Freq: Once | ORAL | Status: DC

## 2015-08-31 NOTE — Patient Instructions (Signed)
Schedule EGD with Esophageal dilation (GERD and Dysphagia)  Schedule a screening colonoscopy  Split prep  Continue Protonix 40 mg daily for now

## 2015-08-31 NOTE — Progress Notes (Signed)
  Primary Care Physician:  MCINNIS,ANGUS G, MD Primary Gastroenterologist:  Dr. Rourk  Pre-Procedure History & Physical: HPI:  Trevor Berg is a 64 y.o. male here for  Rescheduling of EGD with possible esophageal dilation as well as screening colonoscopy. Procedure recently canceled due to chest pain. Saw the cardiologist. He underwent a nuclear stress test. Low risk study. He was cleared for endoscopic evaluation. He continues to have some chest pain, somewhat exertional and certainly somewhat positional in nature. He does have  some chest discomfort which she attributes to reflux as well. He takes Protonix 40 mg daily. He continues to have intermittent esophageal dysphagia. No lower GI tract symptoms. He is due for average risk screening colonoscopy this time as well.  Past Medical History  Diagnosis Date  . Hypertension   . Hyperlipidemia   . Anxiety   . Gastroesophageal reflux disease     possible small Schatzki's ring; esophageal dilatation in 2005  . COPD (chronic obstructive pulmonary disease) (HCC)     Chronic bronchitis; 100-pack-year cigarette consumption  . Chest pain 2005    noncritical coronary disease in 2005: 40% distal RCA, 30% CX, 50% OM 2, EF-60%,  . Overweight(278.02)   . Tobacco abuse     100 pack years  . H. pylori infection 2014    treated with prevpac  . Anginal pain (HCC)   . Asthma     Past Surgical History  Procedure Laterality Date  . Cervical spine surgery      C5-6 and C6-7: Relief of spinal stenosis with corpectomy, foraminotomy and fusion  . Orif ankle fracture      right  . Tonsillectomy    . Esophagogastroduodenoscopy  09/27/2004    RMR:   A couple of tiny, distal esophageal erosions, otherwise normal esophagus aside from a Schatzki ring status post Maloney dilation as described above/ Small hiatal hernia, otherwise normal stomach, normal D1-D2  . Colonoscopy  10/19/2004    RMR:   Friable anal canal, likely source of patient's hematochezia/   Otherwise normal rectum, normal colon.  . Orif tibia fracture      Left  . Esophagogastroduodenoscopy (egd) with esophageal dilation N/A 03/19/2013    RMR:Normal esophagus s/p dilation/Abnormal stomach of uncertain clinical significance-s/p bx positive for H. pylori, status post Prevpac treatment    Prior to Admission medications   Medication Sig Start Date End Date Taking? Authorizing Provider  albuterol (PROVENTIL HFA;VENTOLIN HFA) 108 (90 BASE) MCG/ACT inhaler Inhale 2 puffs into the lungs every 6 (six) hours as needed. Shortness of Breath   Yes Historical Provider, MD  ANORO ELLIPTA 62.5-25 MCG/INH AEPB Inhale 1 puff into the lungs daily.  03/08/15  Yes Historical Provider, MD  aspirin EC 81 MG tablet Take 81 mg by mouth daily.   Yes Historical Provider, MD  atenolol (TENORMIN) 50 MG tablet TAKE ONE TABLET BY MOUTH ONCE DAILY IN THE MORNING 06/01/15  Yes Jonathan F Branch, MD  atorvastatin (LIPITOR) 20 MG tablet Take 1 tablet by mouth daily. Take with 40 mg tablet to equal 60 mg. 02/09/15  Yes Historical Provider, MD  ibuprofen (ADVIL,MOTRIN) 800 MG tablet Take 1 tablet by mouth 3 (three) times daily as needed for moderate pain.  04/26/15  Yes Historical Provider, MD  lisinopril (PRINIVIL,ZESTRIL) 40 MG tablet Take 1 tablet (40 mg total) by mouth daily. 07/28/15  Yes Michele M Lenze, PA-C  LORazepam (ATIVAN) 1 MG tablet Take 1 mg by mouth every 4 (four) hours as needed. For   nerves   Yes Historical Provider, MD  nitroGLYCERIN (NITROSTAT) 0.4 MG SL tablet Place 0.4 mg under the tongue every 5 (five) minutes as needed (used today 06/29/2015). Chest Pain   Yes Historical Provider, MD  pantoprazole (PROTONIX) 40 MG tablet Take 1 tablet (40 mg total) by mouth daily. 06/29/14  Yes Orvil Feil, NP  DEXILANT 60 MG capsule Take 60 mg by mouth daily.  01/28/14   Historical Provider, MD  Na Sulfate-K Sulfate-Mg Sulf (SUPREP BOWEL PREP) SOLN Take 1 kit by mouth as directed. Patient not taking: Reported on 08/31/2015  06/29/15   Daneil Dolin, MD  polyethylene glycol powder [facility]) powder Use 1 capful daily as needed for constipation Patient not taking: Reported on 08/31/2015 10/06/14   Orvil Feil, NP  Pseudoephedrine-Guaifenesin (MUCINEX D) [phone] MG TB12 Take 1 tablet by mouth daily.     Historical Provider, MD    Allergies as of 08/31/2015 - Review Complete 08/31/2015  Allergen Reaction Noted  . Chantix [varenicline]  02/23/2014  . Tomato Rash 06/16/2011    Family History  Problem Relation Age of Onset  . Heart attack Mother     deceased, age 37s  . Ulcers Brother   . Diabetes Brother   . Colon cancer Neg Hx   . Hypertension      Social History   Social History  . Marital Status: Married    Spouse Name: N/A  . Number of Children: 3  . Years of Education: N/A   Occupational History  . disability    Social History Main Topics  . Smoking status: Current Every Day Smoker -- 1.50 packs/day for 50 years    Types: Cigarettes  . Smokeless tobacco: Never Used     Comment: 1 pack per day as of 08/2012  . Alcohol Use: No     Comment: occasionally, usually holidays but sometimes on weekends, prior heavy use  . Drug Use: No  . Sexual Activity: Yes   Other Topics Concern  . Not on file   Social History Narrative    Review of Systems: See HPI, otherwise negative ROS  Physical Exam: BP 157/90 mmHg  Pulse 90  Temp(Src) 98.1 F (36.7 C) (Oral)  Ht 5' 6" (1.676 m)  Wt 211 lb 6.4 oz (95.89 kg)  BMI 34.14 kg/m2 General:   Somewhat disheveled in appearance. pleasant and cooperative in NAD Skin:  Intact without significant lesions or rashes. Eyes:  Sclera clear, no icterus.   Conjunctiva pink. Ears:  Normal auditory acuity. Nose:  No deformity, discharge,  or lesions. Mouth:  No deformity or lesions. Neck:  Supple; no masses or thyromegaly. No significant cervical adenopathy. Lungs:  Clear throughout to auscultation.   No wheezes, crackles, or rhonchi. No acute  distress. Heart:  Regular rate and rhythm; no murmurs, clicks, rubs,  or gallops. Abdomen: Non-distended, normal bowel sounds.  Soft and nontender without appreciable mass or hepatosplenomegaly.  Pulses:  Normal pulses noted. Extremities:  Without clubbing or edema.  Impression:  64 year old gentleman with intermittent esophageal dysphagia on a background of GERD. He is chest pain not felt to be primarily cardiac in origin.  Esophageal dysphagia needs further evaluation. He is also due for screening colonoscopy-average risk at this time.  The risks, benefits, limitations, imponderables and alternatives regarding both EGD and colonoscopy have been reviewed with the patient. Questions have been answered. All parties agreeable.   Recommendations:    Schedule EGD with Esophageal dilation (GERD and Dysphagia)  Schedule a  screening colonoscopy  Split prep  Continue Protonix 40 mg daily for now   Notice: This dictation was prepared with Dragon dictation along with smaller phrase technology. Any transcriptional errors that result from this process are unintentional and may not be corrected upon review. 

## 2015-09-08 ENCOUNTER — Encounter: Payer: Self-pay | Admitting: Cardiology

## 2015-09-08 ENCOUNTER — Ambulatory Visit (INDEPENDENT_AMBULATORY_CARE_PROVIDER_SITE_OTHER): Payer: Medicare HMO | Admitting: Cardiology

## 2015-09-08 VITALS — BP 112/70 | HR 73 | Ht 66.0 in | Wt 213.6 lb

## 2015-09-08 DIAGNOSIS — R0789 Other chest pain: Secondary | ICD-10-CM | POA: Diagnosis not present

## 2015-09-08 MED ORDER — NICOTINE 14 MG/24HR TD PT24: MEDICATED_PATCH | TRANSDERMAL | Status: DC

## 2015-09-08 MED ORDER — NICOTINE 21 MG/24HR TD PT24: MEDICATED_PATCH | TRANSDERMAL | Status: DC

## 2015-09-08 MED ORDER — NICOTINE 7 MG/24HR TD PT24: MEDICATED_PATCH | TRANSDERMAL | Status: DC

## 2015-09-08 NOTE — Progress Notes (Signed)
Patient ID: Trevor Berg, male   DOB: [birth year], 64 y.o.   MRN: [id number]     Clinical Summary Trevor Berg is a 64 y.o.male seen today for follow up of the following medical problems. This is a focused visit on recent symptoms of chest pain.   1.CAD - long history of chest pain - cath in 2005 without mild to moderate non-obstructive disease - negative stress echo 09/2012, baseline LVEF 55-60%  - following last visit with Trevor Berg a Lexiscan was ordered due to chest pain. It showed an inferior defect due to subdiaphragmatic attenuation, no ischemia.  - of note he has history of GERD with esophageal dysphagia followed by GI. Has upcoming EGD.  - pain unchanged since last visit. Pain atypical in that it can last can several last up to 24 hrs constantly.   Past Medical History  Diagnosis Date  . Hypertension   . Hyperlipidemia   . Anxiety   . Gastroesophageal reflux disease     possible small Schatzki's ring; esophageal dilatation in 2005  . COPD (chronic obstructive pulmonary disease) (HCC)     Chronic bronchitis; 100-pack-year cigarette consumption  . Chest pain 2005    noncritical coronary disease in 2005: 40% distal RCA, 30% CX, 50% OM 2, EF-60%,  . Overweight(278.02)   . Tobacco abuse     100 pack years  . H. pylori infection 2014    treated with prevpac  . Anginal pain ([place])   . Asthma      Allergies  Allergen Reactions  . Chantix [Varenicline]     Nightmares and personality changes.  . Tomato Rash     Current Outpatient Prescriptions  Medication Sig Dispense Refill  . albuterol (PROVENTIL HFA;VENTOLIN HFA) 108 (90 BASE) MCG/ACT inhaler Inhale 2 puffs into the lungs every 6 (six) hours as needed. Shortness of Breath    . ANORO ELLIPTA 62.5-25 MCG/INH AEPB Inhale 1 puff into the lungs daily.     Marland Kitchen aspirin EC 81 MG tablet Take 81 mg by mouth daily.    Marland Kitchen atenolol (TENORMIN) 50 MG tablet TAKE ONE TABLET BY MOUTH ONCE DAILY IN THE MORNING 90 tablet 4  . atorvastatin  (LIPITOR) 20 MG tablet Take 20 mg by mouth daily.     Marland Kitchen DEXILANT 60 MG capsule Take 60 mg by mouth daily.     Marland Kitchen ibuprofen (ADVIL,MOTRIN) 800 MG tablet Take 1 tablet by mouth 3 (three) times daily as needed for moderate pain.     Marland Kitchen lisinopril (PRINIVIL,ZESTRIL) 40 MG tablet Take 1 tablet (40 mg total) by mouth daily. 90 tablet 3  . LORazepam (ATIVAN) 1 MG tablet Take 1 mg by mouth every 4 (four) hours as needed. For nerves    . nitroGLYCERIN (NITROSTAT) 0.4 MG SL tablet Place 0.4 mg under the tongue every 5 (five) minutes as needed (used today 06/29/2015). Chest Pain    . pantoprazole (PROTONIX) 40 MG tablet Take 1 tablet (40 mg total) by mouth daily. 90 tablet 3  . polyethylene glycol-electrolytes (NULYTELY/GOLYTELY) 420 G solution Take 4,000 mLs by mouth once. 4000 mL 0  . Pseudoephedrine-Guaifenesin (MUCINEX D) [phone] MG TB12 Take 1 tablet by mouth daily.      No current facility-administered medications for this visit.     Past Surgical History  Procedure Laterality Date  . Cervical spine surgery      C5-6 and C6-7: Relief of spinal stenosis with corpectomy, foraminotomy and fusion  . Orif ankle fracture  right  . Tonsillectomy    . Esophagogastroduodenoscopy  09/27/2004    RMR:   A couple of tiny, distal esophageal erosions, otherwise normal esophagus aside from a Schatzki ring status post Maloney dilation as described above/ Small hiatal hernia, otherwise normal stomach, normal D1-D2  . Colonoscopy  10/19/2004    RMR:   Friable anal canal, likely source of patient's hematochezia/  Otherwise normal rectum, normal colon.  . Orif tibia fracture      Left  . Esophagogastroduodenoscopy (egd) with esophageal dilation N/A 03/19/2013    IZT:IWPYKD esophagus s/p dilation/Abnormal stomach of uncertain clinical significance-s/p bx positive for H. pylori, status post Prevpac treatment     Allergies  Allergen Reactions  . Chantix [Varenicline]     Nightmares and personality changes.    . Tomato Rash      Family History  Problem Relation Age of Onset  . Heart attack Mother     deceased, age 70s  . Ulcers Brother   . Diabetes Brother   . Colon cancer Neg Hx   . Hypertension       Social History Trevor Berg reports that he has been smoking Cigarettes.  He has a 75 pack-year smoking history. He has never used smokeless tobacco. Trevor Berg reports that he does not drink alcohol.   Review of Systems CONSTITUTIONAL: No weight loss, fever, chills, weakness or fatigue.  HEENT: Eyes: No visual loss, blurred vision, double vision or yellow sclerae.No hearing loss, sneezing, congestion, runny nose or sore throat.  SKIN: No rash or itching.  CARDIOVASCULAR: per HPI RESPIRATORY: No shortness of breath, cough or sputum.  GASTROINTESTINAL: No anorexia, nausea, vomiting or diarrhea. No abdominal pain or blood.  GENITOURINARY: No burning on urination, no polyuria NEUROLOGICAL: No headache, dizziness, syncope, paralysis, ataxia, numbness or tingling in the extremities. No change in bowel or bladder control.  MUSCULOSKELETAL: No muscle, back pain, joint pain or stiffness.  LYMPHATICS: No enlarged nodes. No history of splenectomy.  PSYCHIATRIC: No history of depression or anxiety.  ENDOCRINOLOGIC: No reports of sweating, cold or heat intolerance. No polyuria or polydipsia.  Marland Kitchen   Physical Examination Filed Vitals:   09/08/15 1420  BP: 112/70  Pulse: 73   Filed Vitals:   09/08/15 1420  Height: 5\' 6"  (1.676 m)  Weight: 213 lb 9.6 oz (96.888 kg)    Gen: resting comfortably, no acute distress HEENT: no scleral icterus, pupils equal round and reactive, no palptable cervical adenopathy,  CV: RRR, no m/r/,g no jvd Resp: Clear to auscultation bilaterally GI: abdomen is soft, non-tender, non-distended, normal bowel sounds, no hepatosplenomegaly MSK: extremities are warm, no edema.  Skin: warm, no rash Neuro:  no focal deficits Psych: appropriate affect   Diagnostic  Studies 07/2014 Cath The left main is a large vessel with no significant disease.  The LAD is a medium size vessel which courses three quarters of the way down the anterior wall and has no significant disease. There are two small diagonal branches with no significant disease.  The left circumflex is a large vessel coursing through the AV groove. The third obtuse marginal Myleen Brailsford is free of the AV groove circumflex and noted to have a mild 30% narrowing after the takeoff the second OM.  The first OM is a small vessel which bifurcates in the proximal portion with no significant disease.  The second OM is a large vessel which bifurcates distally and has 50% mid vessel stenosis.  The third OM is a small vessel with no  significant disease.  The right coronary artery is a large vessel which is dominant and gives rise to both the PDA as well as the posterolateral Telisha Zawadzki. The RCA is ectatic but has no high grade stenosis.  The PDA is a medium size vessel with no significant disease.  The PLA is a large vessel which bifurcates in the mid segment. There is 40% lesion in the proximal portion fo the lower bifurcation.  Left ventriculogram reveals a preserved ejection fraction of 60%.  Hemodynamics: The systemic arterial pressure was 112/69. Left ventricular pressure was 115/8. Left ventricular end diastolic pressure was 13.  CONCLUSION: 1. Non-critical coronary artery disease. 2. Normal left ventricular systolic function.   09/2012 Stress echo Study Conclusions  - Stress: There was resting hypertension with a hypertensive response to stress. Atypical chest pain was present before and during exercise without increase in intensity, and it persisted following conclusion of the test. - Stress ECG conclusions: No diagnostic ST abnormalities or arrhythmias. The stress ECG was negative for ischemia. [facility] scoring: exercise time of 76min;  maximum ST deviation of 53mm; angina present but did not limit exercise; resulting score is -1. This score predicts a moderate risk of cardiac events. - Staged echo: There was no echocardiographic evidence for stress-induced ischemia. - Peak stress: LV global systolic function was vigorous. The estimated LV ejection fraction was 70% to 75%. No evidence for new LV regional wall motion abnormalities.     Assessment and Plan   1.Chest pain - long history of atypical chest pain - negative ischemic evaluations in the past including cath in 2005, and most recently a lexiscan negative for ischemia - no further workup at this time. He is to undergo an EGD this week, perhaps an etiology will be detected for his symptoms. No evidence of cardiac cause  2. Tobacco abuse - will give Rx for nicotine patches   F/u 6 months.    Arnoldo Lenis, M.D.

## 2015-09-08 NOTE — Patient Instructions (Signed)
Your physician wants you to follow-up in: 6 months with Dr Bryna Colander will receive a reminder letter in the mail two months in advance. If you don't receive a letter, please call our office to schedule the follow-up appointment.   Take Nicoderm patches as directed     If you need a refill on your cardiac medications before your next appointment, please call your pharmacy.     Thank you for choosing [facility] !

## 2015-09-09 ENCOUNTER — Encounter (HOSPITAL_COMMUNITY)
Admission: RE | Disposition: A | Discharge status: Home / Self Care | Payer: Self-pay | Source: Ambulatory Visit | Attending: Internal Medicine

## 2015-09-09 ENCOUNTER — Encounter (HOSPITAL_COMMUNITY): Payer: Self-pay

## 2015-09-09 ENCOUNTER — Ambulatory Visit (HOSPITAL_COMMUNITY)
Admission: RE | Admit: 2015-09-09 | Discharge: 2015-09-09 | Disposition: A | Discharge status: Home / Self Care | Payer: Medicare HMO | Source: Ambulatory Visit | Attending: Internal Medicine | Admitting: Internal Medicine

## 2015-09-09 ENCOUNTER — Telehealth: Payer: Self-pay | Admitting: Cardiology

## 2015-09-09 DIAGNOSIS — K219 Gastro-esophageal reflux disease without esophagitis: Secondary | ICD-10-CM

## 2015-09-09 DIAGNOSIS — K5730 Diverticulosis of large intestine without perforation or abscess without bleeding: Secondary | ICD-10-CM | POA: Diagnosis not present

## 2015-09-09 DIAGNOSIS — K2970 Gastritis, unspecified, without bleeding: Secondary | ICD-10-CM | POA: Insufficient documentation

## 2015-09-09 DIAGNOSIS — D124 Benign neoplasm of descending colon: Secondary | ICD-10-CM | POA: Diagnosis not present

## 2015-09-09 DIAGNOSIS — I10 Essential (primary) hypertension: Secondary | ICD-10-CM | POA: Diagnosis not present

## 2015-09-09 DIAGNOSIS — I2510 Atherosclerotic heart disease of native coronary artery without angina pectoris: Secondary | ICD-10-CM | POA: Diagnosis not present

## 2015-09-09 DIAGNOSIS — D122 Benign neoplasm of ascending colon: Secondary | ICD-10-CM | POA: Diagnosis not present

## 2015-09-09 DIAGNOSIS — K3189 Other diseases of stomach and duodenum: Secondary | ICD-10-CM | POA: Diagnosis not present

## 2015-09-09 DIAGNOSIS — R1319 Other dysphagia: Secondary | ICD-10-CM | POA: Diagnosis not present

## 2015-09-09 DIAGNOSIS — F419 Anxiety disorder, unspecified: Secondary | ICD-10-CM | POA: Insufficient documentation

## 2015-09-09 DIAGNOSIS — E785 Hyperlipidemia, unspecified: Secondary | ICD-10-CM | POA: Insufficient documentation

## 2015-09-09 DIAGNOSIS — D128 Benign neoplasm of rectum: Secondary | ICD-10-CM | POA: Insufficient documentation

## 2015-09-09 DIAGNOSIS — E663 Overweight: Secondary | ICD-10-CM | POA: Insufficient documentation

## 2015-09-09 DIAGNOSIS — K621 Rectal polyp: Secondary | ICD-10-CM | POA: Diagnosis not present

## 2015-09-09 DIAGNOSIS — Z860100 Personal history of colon polyps, unspecified: Secondary | ICD-10-CM | POA: Insufficient documentation

## 2015-09-09 DIAGNOSIS — Z79899 Other long term (current) drug therapy: Secondary | ICD-10-CM | POA: Insufficient documentation

## 2015-09-09 DIAGNOSIS — R1310 Dysphagia, unspecified: Secondary | ICD-10-CM | POA: Insufficient documentation

## 2015-09-09 DIAGNOSIS — Z1211 Encounter for screening for malignant neoplasm of colon: Secondary | ICD-10-CM | POA: Diagnosis not present

## 2015-09-09 DIAGNOSIS — F17200 Nicotine dependence, unspecified, uncomplicated: Secondary | ICD-10-CM | POA: Insufficient documentation

## 2015-09-09 DIAGNOSIS — K319 Disease of stomach and duodenum, unspecified: Secondary | ICD-10-CM | POA: Diagnosis not present

## 2015-09-09 DIAGNOSIS — J449 Chronic obstructive pulmonary disease, unspecified: Secondary | ICD-10-CM | POA: Diagnosis not present

## 2015-09-09 HISTORY — PX: ESOPHAGEAL DILATION: SHX303

## 2015-09-09 HISTORY — PX: ESOPHAGOGASTRODUODENOSCOPY: SHX5428

## 2015-09-09 HISTORY — PX: COLONOSCOPY: SHX5424

## 2015-09-09 SURGERY — COLONOSCOPY
Anesthesia: Moderate Sedation

## 2015-09-09 MED ORDER — ONDANSETRON HCL 4 MG/2ML IJ SOLN
INTRAMUSCULAR | Status: DC | PRN
  Administered 2015-09-09: 4 mg via INTRAVENOUS

## 2015-09-09 MED ORDER — LIDOCAINE VISCOUS 2 % MT SOLN
OROMUCOSAL | Status: AC
  Filled 2015-09-09: qty 15

## 2015-09-09 MED ORDER — ONDANSETRON HCL 4 MG/2ML IJ SOLN
INTRAMUSCULAR | Status: AC
  Filled 2015-09-09: qty 2

## 2015-09-09 MED ORDER — LIDOCAINE VISCOUS 2 % MT SOLN
OROMUCOSAL | Status: DC | PRN
  Administered 2015-09-09: 1 via OROMUCOSAL

## 2015-09-09 MED ORDER — STERILE WATER FOR IRRIGATION IR SOLN
Status: DC | PRN
  Administered 2015-09-09: 2.5 mL

## 2015-09-09 MED ORDER — MIDAZOLAM HCL 5 MG/5ML IJ SOLN
INTRAMUSCULAR | Status: DC | PRN
  Administered 2015-09-09: 1 mg via INTRAVENOUS
  Administered 2015-09-09: 2 mg via INTRAVENOUS
  Administered 2015-09-09 (×3): 1 mg via INTRAVENOUS
  Administered 2015-09-09: 2 mg via INTRAVENOUS
  Administered 2015-09-09: 1 mg via INTRAVENOUS

## 2015-09-09 MED ORDER — MEPERIDINE HCL 100 MG/ML IJ SOLN
INTRAMUSCULAR | Status: AC
  Filled 2015-09-09: qty 2

## 2015-09-09 MED ORDER — MEPERIDINE HCL 100 MG/ML IJ SOLN
INTRAMUSCULAR | Status: DC | PRN
  Administered 2015-09-09 (×3): 25 mg via INTRAVENOUS
  Administered 2015-09-09: 50 mg via INTRAVENOUS

## 2015-09-09 MED ORDER — MIDAZOLAM HCL 5 MG/5ML IJ SOLN
INTRAMUSCULAR | Status: AC
  Filled 2015-09-09: qty 10

## 2015-09-09 MED ORDER — SODIUM CHLORIDE 0.9 % IV SOLN
INTRAVENOUS | Status: DC
Start: 2015-09-09 — End: 2015-09-09
  Administered 2015-09-09: 10:00:00 via INTRAVENOUS

## 2015-09-09 NOTE — Op Note (Signed)
[facility] [address] [place], [address]   COLONOSCOPY PROCEDURE REPORT  PATIENT: Trevor Berg, Trevor Berg  MR#: [id number] BIRTHDATE: [birth year] , 62  yrs. old GENDER: male ENDOSCOPIST: R.  Garfield Cornea, MD FACP [facility] REFERRED [id number] Everette Rank, M.D. PROCEDURE DATE:  2015-10-06 PROCEDURE:   Colonoscopy with biopsy and Colonoscopy with snare polypectomy INDICATIONS:colorectal cancer screening. MEDICATIONS: Versed 9 mg IV and Demerol 125 mg IV in divided doses. Zofran 4 mg IV. ASA CLASS:       Class II  CONSENT: The risks, benefits, alternatives and imponderables including but not limited to bleeding, perforation as well as the possibility of a missed lesion have been reviewed.  The potential for biopsy, lesion removal, etc. have also been discussed. Questions have been answered.  All parties agreeable.  Please see the history and physical in the medical record for more information.  DESCRIPTION OF PROCEDURE:   After the risks benefits and alternatives of the procedure were thoroughly explained, informed consent was obtained.  The digital rectal exam revealed no abnormalities of the rectum.   The     endoscope was introduced through the anus and advanced to the cecum, which was identified by both the appendix and ileocecal valve. No adverse events experienced.   The quality of the prep was adequate  The instrument was then slowly withdrawn as the colon was fully examined. Estimated blood loss is zero unless otherwise noted in this procedure report.      COLON FINDINGS: (1) 5 mm polyp in the rectum at 10 cm with (2) adjacent diminutive polyps; otherwise, the remainder of the rectal mucosa appeared normal.  The patient had scattered pancolonic diverticula; in the ascending segment, there was (1) 5 mm polyp; otherwise, the remainder colonic mucosa appeared normal.  The above-mentioned polyps were cold snared, cold biopsy and cold snare removed, respectively.   Retroflexion was performed. .  Withdrawal time=10 minutes 0 seconds.  The scope was withdrawn and the procedure completed. COMPLICATIONS: There were no immediate complications. EBL 2 mL ENDOSCOPIC IMPRESSION: Pancolonic diverticulosis. Multiple rectal and colonic polyps removed as described above  RECOMMENDATIONS: Follow-up on pathology. See EGD report.  eSigned:  R. Garfield Cornea, MD Rosalita Chessman [facility] October 06, 2015 11:33 AM   cc:  CPT CODES: ICD CODES:  The ICD and CPT codes recommended by this software are interpretations from the data that the clinical staff has captured with the software.  The verification of the translation of this report to the ICD and CPT codes and modifiers is the sole responsibility of the health care institution and practicing physician where this report was generated.  [employer]. will not be held responsible for the validity of the ICD and CPT codes included on this report.  AMA assumes no liability for data contained or not contained herein. CPT is a Designer, television/film set of the [employer].  PATIENT NAME:  Trevor Berg, Trevor Berg MR#: [id number]

## 2015-09-09 NOTE — Telephone Encounter (Signed)
LM that [facility] sells their brand (equate) of nicoderm 21 mg for $15.98 a week.

## 2015-09-09 NOTE — Discharge Instructions (Signed)
Colonoscopy Discharge Instructions  Read the instructions outlined below and refer to this sheet in the next few weeks. These discharge instructions provide you with general information on caring for yourself after you leave the hospital. Your doctor may also give you specific instructions. While your treatment has been planned according to the most current medical practices available, unavoidable complications occasionally occur. If you have any problems or questions after discharge, call Dr. Gala Romney at [phone]. ACTIVITY  You may resume your regular activity, but move at a slower pace for the next 24 hours.   Take frequent rest periods for the next 24 hours.   Walking will help get rid of the air and reduce the bloated feeling in your belly (abdomen).   No driving for 24 hours (because of the medicine (anesthesia) used during the test).    Do not sign any important legal documents or operate any machinery for 24 hours (because of the anesthesia used during the test).  NUTRITION  Drink plenty of fluids.   You may resume your normal diet as instructed by your doctor.   Begin with a light meal and progress to your normal diet. Heavy or fried foods are harder to digest and may make you feel sick to your stomach (nauseated).   Avoid alcoholic beverages for 24 hours or as instructed.  MEDICATIONS  You may resume your normal medications unless your doctor tells you otherwise.  WHAT YOU CAN EXPECT TODAY  Some feelings of bloating in the abdomen.   Passage of more gas than usual.   Spotting of blood in your stool or on the toilet paper.  IF YOU HAD POLYPS REMOVED DURING THE COLONOSCOPY:  No aspirin products for 7 days or as instructed.   No alcohol for 7 days or as instructed.   Eat a soft diet for the next 24 hours.  FINDING OUT THE RESULTS OF YOUR TEST Not all test results are available during your visit. If your test results are not back during the visit, make an appointment  with your caregiver to find out the results. Do not assume everything is normal if you have not heard from your caregiver or the medical facility. It is important for you to follow up on all of your test results.  SEEK IMMEDIATE MEDICAL ATTENTION IF:  You have more than a spotting of blood in your stool.   Your belly is swollen (abdominal distention).   You are nauseated or vomiting.   You have a temperature over 101.   You have abdominal pain or discomfort that is severe or gets worse throughout the day.     Diverticulosis and polyp information provided  GERD information provided.  Continue Protonix 40 mg daily  Further recommendations to follow pending review of pathology report Gastrointestinal Endoscopy, Care After Refer to this sheet in the next few weeks. These instructions provide you with information on caring for yourself after your procedure. Your caregiver may also give you more specific instructions. Your treatment has been planned according to current medical practices, but problems sometimes occur. Call your caregiver if you have any problems or questions after your procedure. HOME CARE INSTRUCTIONS  If you were given medicine to help you relax (sedative), do not drive, operate machinery, or sign important documents for 24 hours.  Avoid alcohol and hot or warm beverages for the first 24 hours after the procedure.  Only take over-the-counter or prescription medicines for pain, discomfort, or fever as directed by your caregiver. You may resume  taking your normal medicines unless your caregiver tells you otherwise. Ask your caregiver when you may resume taking medicines that may cause bleeding, such as aspirin, clopidogrel, or warfarin.  You may return to your normal diet and activities on the day after your procedure, or as directed by your caregiver. Walking may help to reduce any bloated feeling in your abdomen.  Drink enough fluids to keep your urine clear or pale  yellow.  You may gargle with salt water if you have a sore throat. SEEK IMMEDIATE MEDICAL CARE IF:  You have severe nausea or vomiting.  You have severe abdominal pain, abdominal cramps that last longer than 6 hours, or abdominal swelling (distention).  You have severe shoulder or back pain.  You have trouble swallowing.  You have shortness of breath, your breathing is shallow, or you are breathing faster than normal.  You have a fever or a rapid heartbeat.  You vomit blood or material that looks like coffee grounds.  You have bloody, black, or tarry stools. MAKE SURE YOU:  Understand these instructions.  Will watch your condition.  Will get help right away if you are not doing well or get worse.   This information is not intended to replace advice given to you by your health care provider. Make sure you discuss any questions you have with your health care provider.   Document Released: 05/30/2004 Document Revised: 11/06/2014 Document Reviewed: 01/16/2012 Elsevier Interactive Patient Education 2016 [place]. Gastroesophageal Reflux Disease, Adult Normally, food travels down the esophagus and stays in the stomach to be digested. However, when a person has gastroesophageal reflux disease (GERD), food and stomach acid move back up into the esophagus. When this happens, the esophagus becomes sore and inflamed. Over time, GERD can create small holes (ulcers) in the lining of the esophagus.  CAUSES This condition is caused by a problem with the muscle between the esophagus and the stomach (lower esophageal sphincter, or LES). Normally, the LES muscle closes after food passes through the esophagus to the stomach. When the LES is weakened or abnormal, it does not close properly, and that allows food and stomach acid to go back up into the esophagus. The LES can be weakened by certain dietary substances, medicines, and medical conditions, including:  Tobacco  use.  Pregnancy.  Having a hiatal hernia.  Heavy alcohol use.  Certain foods and beverages, such as coffee, chocolate, onions, and peppermint. RISK FACTORS This condition is more likely to develop in:  People who have an increased body weight.  People who have connective tissue disorders.  People who use NSAID medicines. SYMPTOMS Symptoms of this condition include:  Heartburn.  Difficult or painful swallowing.  The feeling of having a lump in the throat.  Abitter taste in the mouth.  Bad breath.  Having a large amount of saliva.  Having an upset or bloated stomach.  Belching.  Chest pain.  Shortness of breath or wheezing.  Ongoing (chronic) cough or a night-time cough.  Wearing away of tooth enamel.  Weight loss. Different conditions can cause chest pain. Make sure to see your health care provider if you experience chest pain. DIAGNOSIS Your health care provider will take a medical history and perform a physical exam. To determine if you have mild or severe GERD, your health care provider may also monitor how you respond to treatment. You may also have other tests, including:  An endoscopy toexamine your stomach and esophagus with a small camera.  A test thatmeasures the  acidity level in your esophagus.  A test thatmeasures how much pressure is on your esophagus.  A barium swallow or modified barium swallow to show the shape, size, and functioning of your esophagus. TREATMENT The goal of treatment is to help relieve your symptoms and to prevent complications. Treatment for this condition may vary depending on how severe your symptoms are. Your health care provider may recommend:  Changes to your diet.  Medicine.  Surgery. HOME CARE INSTRUCTIONS Diet  Follow a diet as recommended by your health care provider. This may involve avoiding foods and drinks such as:  Coffee and tea (with or without caffeine).  Drinks that containalcohol.  Energy  drinks and sports drinks.  Carbonated drinks or sodas.  Chocolate and cocoa.  Peppermint and mint flavorings.  Garlic and onions.  Horseradish.  Spicy and acidic foods, including peppers, chili powder, curry powder, vinegar, hot sauces, and barbecue sauce.  Citrus fruit juices and citrus fruits, such as oranges, lemons, and limes.  Tomato-based foods, such as red sauce, chili, salsa, and pizza with red sauce.  Fried and fatty foods, such as donuts, french fries, potato chips, and high-fat dressings.  High-fat meats, such as hot dogs and fatty cuts of red and white meats, such as rib eye steak, sausage, ham, and bacon.  High-fat dairy items, such as whole milk, butter, and cream cheese.  Eat small, frequent meals instead of large meals.  Avoid drinking large amounts of liquid with your meals.  Avoid eating meals during the 2-3 hours before bedtime.  Avoid lying down right after you eat.  Do not exercise right after you eat. General Instructions  Pay attention to any changes in your symptoms.  Take over-the-counter and prescription medicines only as told by your health care provider. Do not take aspirin, ibuprofen, or other NSAIDs unless your health care provider told you to do so.  Do not use any tobacco products, including cigarettes, chewing tobacco, and e-cigarettes. If you need help quitting, ask your health care provider.  Wear loose-fitting clothing. Do not wear anything tight around your waist that causes pressure on your abdomen.  Raise (elevate) the head of your bed 6 inches (15cm).  Try to reduce your stress, such as with yoga or meditation. If you need help reducing stress, ask your health care provider.  If you are overweight, reduce your weight to an amount that is healthy for you. Ask your health care provider for guidance about a safe weight loss goal.  Keep all follow-up visits as told by your health care provider. This is important. SEEK MEDICAL  CARE IF:  You have new symptoms.  You have unexplained weight loss.  You have difficulty swallowing, or it hurts to swallow.  You have wheezing or a persistent cough.  Your symptoms do not improve with treatment.  You have a hoarse voice. SEEK IMMEDIATE MEDICAL CARE IF:  You have pain in your arms, neck, jaw, teeth, or back.  You feel sweaty, dizzy, or light-headed.  You have chest pain or shortness of breath.  You vomit and your vomit looks like blood or coffee grounds.  You faint.  Your stool is bloody or black.  You cannot swallow, drink, or eat.   This information is not intended to replace advice given to you by your health care provider. Make sure you discuss any questions you have with your health care provider.   Document Released: 07/26/2005 Document Revised: 07/07/2015 Document Reviewed: 02/10/2015 Elsevier Interactive Patient Education 2016 [employer] Colonoscopy, Care After Refer to this sheet in the next few weeks. These instructions provide you with information on caring for yourself after your procedure. Your health care provider may also give you more specific instructions. Your treatment has been planned according to current medical practices, but problems sometimes occur. Call your health care provider if you have any problems or questions after your procedure. WHAT TO EXPECT AFTER THE PROCEDURE  After your procedure, it is typical to have the following:  A small amount of blood in your stool.  Moderate amounts of gas and mild abdominal cramping or bloating. HOME CARE INSTRUCTIONS  Do not drive, operate machinery, or sign important documents for 24 hours.  You may shower and resume your regular physical activities, but move at a slower pace for the first 24 hours.  Take frequent rest periods for the first 24 hours.  Walk around or put a warm pack on your abdomen to help reduce abdominal cramping and bloating.  Drink enough fluids to keep your  urine clear or pale yellow.  You may resume your normal diet as instructed by your health care provider. Avoid heavy or fried foods that are hard to digest.  Avoid drinking alcohol for 24 hours or as instructed by your health care provider.  Only take over-the-counter or prescription medicines as directed by your health care provider.  If a tissue sample (biopsy) was taken during your procedure:  Do not take aspirin or blood thinners for 7 days, or as instructed by your health care provider.  Do not drink alcohol for 7 days, or as instructed by your health care provider.  Eat soft foods for the first 24 hours. SEEK MEDICAL CARE IF: You have persistent spotting of blood in your stool 2-3 days after the procedure. SEEK IMMEDIATE MEDICAL CARE IF:  You have more than a small spotting of blood in your stool.  You pass large blood clots in your stool.  Your abdomen is swollen (distended).  You have nausea or vomiting.  You have a fever.  You have increasing abdominal pain that is not relieved with medicine.   This information is not intended to replace advice given to you by your health care provider. Make sure you discuss any questions you have with your health care provider.   Document Released: 05/30/2004 Document Revised: 08/06/2013 Document Reviewed: 06/23/2013 Elsevier Interactive Patient Education 2016 [employer]. Diverticulosis Diverticulosis is the condition that develops when small pouches (diverticula) form in the wall of your colon. Your colon, or large intestine, is where water is absorbed and stool is formed. The pouches form when the inside layer of your colon pushes through weak spots in the outer layers of your colon. CAUSES  No one knows exactly what causes diverticulosis. RISK FACTORS Being older than 75. Your risk for this condition increases with age. Diverticulosis is rare in people younger than 40 years. By age 81, almost everyone has it. Eating a  low-fiber diet. Being frequently constipated. Being overweight. Not getting enough exercise. Smoking. Taking over-the-counter pain medicines, like aspirin and ibuprofen. SYMPTOMS  Most people with diverticulosis do not have symptoms. DIAGNOSIS  Because diverticulosis often has no symptoms, health care providers often discover the condition during an exam for other colon problems. In many cases, a health care provider will diagnose diverticulosis while using a flexible scope to examine the colon (colonoscopy). TREATMENT  If you have never developed an infection related to diverticulosis, you may not need treatment. If you have had  an infection before, treatment may include: Eating more fruits, vegetables, and grains. Taking a fiber supplement. Taking a live bacteria supplement (probiotic). Taking medicine to relax your colon. HOME CARE INSTRUCTIONS  Drink at least 6-8 glasses of water each day to prevent constipation. Try not to strain when you have a bowel movement. Keep all follow-up appointments. If you have had an infection before: Increase the fiber in your diet as directed by your health care provider or dietitian. Take a dietary fiber supplement if your health care provider approves. Only take medicines as directed by your health care provider. SEEK MEDICAL CARE IF:  You have abdominal pain. You have bloating. You have cramps. You have not gone to the bathroom in 3 days. SEEK IMMEDIATE MEDICAL CARE IF:  Your pain gets worse. Yourbloating becomes very bad. You have a fever or chills, and your symptoms suddenly get worse. You begin vomiting. You have bowel movements that are bloody or black. MAKE SURE YOU: Understand these instructions. Will watch your condition. Will get help right away if you are not doing well or get worse.   This information is not intended to replace advice given to you by your health care provider. Make sure you discuss any questions you have with  your health care provider.   Document Released: 07/13/2004 Document Revised: 10/21/2013 Document Reviewed: 09/10/2013 Colon Polyps Polyps are lumps of extra tissue growing inside the body. Polyps can grow in the large intestine (colon). Most colon polyps are noncancerous (benign). However, some colon polyps can become cancerous over time. Polyps that are larger than a pea may be harmful. To be safe, caregivers remove and test all polyps. CAUSES  Polyps form when mutations in the genes cause your cells to grow and divide even though no more tissue is needed. RISK FACTORS There are a number of risk factors that can increase your chances of getting colon polyps. They include: Being older than 50 years. Family history of colon polyps or colon cancer. Long-term colon diseases, such as colitis or Crohn disease. Being overweight. Smoking. Being inactive. Drinking too much alcohol. SYMPTOMS  Most small polyps do not cause symptoms. If symptoms are present, they may include: Blood in the stool. The stool may look dark red or black. Constipation or diarrhea that lasts longer than 1 week. DIAGNOSIS People often do not know they have polyps until their caregiver finds them during a regular checkup. Your caregiver can use 4 tests to check for polyps: Digital rectal exam. The caregiver wears gloves and feels inside the rectum. This test would find polyps only in the rectum. Barium enema. The caregiver puts a liquid called barium into your rectum before taking X-rays of your colon. Barium makes your colon look white. Polyps are dark, so they are easy to see in the X-ray pictures. Sigmoidoscopy. A thin, flexible tube (sigmoidoscope) is placed into your rectum. The sigmoidoscope has a light and tiny camera in it. The caregiver uses the sigmoidoscope to look at the last third of your colon. Colonoscopy. This test is like sigmoidoscopy, but the caregiver looks at the entire colon. This is the most common  method for finding and removing polyps. TREATMENT  Any polyps will be removed during a sigmoidoscopy or colonoscopy. The polyps are then tested for cancer. PREVENTION  To help lower your risk of getting more colon polyps: Eat plenty of fruits and vegetables. Avoid eating fatty foods. Do not smoke. Avoid drinking alcohol. Exercise every day. Lose weight if recommended by your caregiver.  Eat plenty of calcium and folate. Foods that are rich in calcium include milk, cheese, and broccoli. Foods that are rich in folate include chickpeas, kidney beans, and spinach. HOME CARE INSTRUCTIONS Keep all follow-up appointments as directed by your caregiver. You may need periodic exams to check for polyps. SEEK MEDICAL CARE IF: You notice bleeding during a bowel movement.   This information is not intended to replace advice given to you by your health care provider. Make sure you discuss any questions you have with your health care provider.   Document Released: 07/12/2004 Document Revised: 11/06/2014 Document Reviewed: 12/26/2011 Elsevier Interactive Patient Education 2016 [employer].  Chartered certified accountant Patient Education [employer].

## 2015-09-09 NOTE — OR Nursing (Signed)
ON ASSESSMENT PATIENT STATED HE WAS HAVING PAIN UNDER HIS RIBCAGE LEVEL 6 PAIN, AND THAT HE HAD TAKEN A NITROGLYCERIN LAST NIGHT FOR PAIN IN CHEST.  PATIENT STATED PAIN WAS NOT RELIEVED WITH MEDICATION.  PATIENT ALSO STATED HE WENT TO THE CARDIOLOGIST ON YESTERDAY AND THE DR. SAYS THEY COULD NOT FIND ANY MAJOR PROBLEMS WITH HEART.  ALL INFORMATION REPORTED TO DR. Gala Romney. INSTRUCTED TO PROCEED WITH PRE PROCEDURE.  PATIENT ALSO SAID HE WAS HAVING BROWN LIQUID STOOL THIS AM.  HE SAID HE DRANK ALL BUT ABOUT 8 OUNCES OF THE PREP.

## 2015-09-09 NOTE — H&P (View-Only) (Signed)
  Primary Care Physician:  MCINNIS,ANGUS G, MD Primary Gastroenterologist:  Dr. Tomekia Helton  Pre-Procedure History & Physical: HPI:  Trevor Berg is a 64 y.o. male here for  Rescheduling of EGD with possible esophageal dilation as well as screening colonoscopy. Procedure recently canceled due to chest pain. Saw the cardiologist. He underwent a nuclear stress test. Low risk study. He was cleared for endoscopic evaluation. He continues to have some chest pain, somewhat exertional and certainly somewhat positional in nature. He does have  some chest discomfort which she attributes to reflux as well. He takes Protonix 40 mg daily. He continues to have intermittent esophageal dysphagia. No lower GI tract symptoms. He is due for average risk screening colonoscopy this time as well.  Past Medical History  Diagnosis Date  . Hypertension   . Hyperlipidemia   . Anxiety   . Gastroesophageal reflux disease     possible small Schatzki's ring; esophageal dilatation in 2005  . COPD (chronic obstructive pulmonary disease) (HCC)     Chronic bronchitis; 100-pack-year cigarette consumption  . Chest pain 2005    noncritical coronary disease in 2005: 40% distal RCA, 30% CX, 50% OM 2, EF-60%,  . Overweight(278.02)   . Tobacco abuse     100 pack years  . H. pylori infection 2014    treated with prevpac  . Anginal pain (HCC)   . Asthma     Past Surgical History  Procedure Laterality Date  . Cervical spine surgery      C5-6 and C6-7: Relief of spinal stenosis with corpectomy, foraminotomy and fusion  . Orif ankle fracture      right  . Tonsillectomy    . Esophagogastroduodenoscopy  09/27/2004    RMR:   A couple of tiny, distal esophageal erosions, otherwise normal esophagus aside from a Schatzki ring status post Maloney dilation as described above/ Small hiatal hernia, otherwise normal stomach, normal D1-D2  . Colonoscopy  10/19/2004    RMR:   Friable anal canal, likely source of patient's hematochezia/   Otherwise normal rectum, normal colon.  . Orif tibia fracture      Left  . Esophagogastroduodenoscopy (egd) with esophageal dilation N/A 03/19/2013    RMR:Normal esophagus s/p dilation/Abnormal stomach of uncertain clinical significance-s/p bx positive for H. pylori, status post Prevpac treatment    Prior to Admission medications   Medication Sig Start Date End Date Taking? Authorizing Provider  albuterol (PROVENTIL HFA;VENTOLIN HFA) 108 (90 BASE) MCG/ACT inhaler Inhale 2 puffs into the lungs every 6 (six) hours as needed. Shortness of Breath   Yes Historical Provider, MD  ANORO ELLIPTA 62.5-25 MCG/INH AEPB Inhale 1 puff into the lungs daily.  03/08/15  Yes Historical Provider, MD  aspirin EC 81 MG tablet Take 81 mg by mouth daily.   Yes Historical Provider, MD  atenolol (TENORMIN) 50 MG tablet TAKE ONE TABLET BY MOUTH ONCE DAILY IN THE MORNING 06/01/15  Yes Jonathan F Branch, MD  atorvastatin (LIPITOR) 20 MG tablet Take 1 tablet by mouth daily. Take with 40 mg tablet to equal 60 mg. 02/09/15  Yes Historical Provider, MD  ibuprofen (ADVIL,MOTRIN) 800 MG tablet Take 1 tablet by mouth 3 (three) times daily as needed for moderate pain.  04/26/15  Yes Historical Provider, MD  lisinopril (PRINIVIL,ZESTRIL) 40 MG tablet Take 1 tablet (40 mg total) by mouth daily. 07/28/15  Yes Michele M Lenze, PA-C  LORazepam (ATIVAN) 1 MG tablet Take 1 mg by mouth every 4 (four) hours as needed. For   nerves   Yes Historical Provider, MD  nitroGLYCERIN (NITROSTAT) 0.4 MG SL tablet Place 0.4 mg under the tongue every 5 (five) minutes as needed (used today 06/29/2015). Chest Pain   Yes Historical Provider, MD  pantoprazole (PROTONIX) 40 MG tablet Take 1 tablet (40 mg total) by mouth daily. 06/29/14  Yes Anna W Sams, NP  DEXILANT 60 MG capsule Take 60 mg by mouth daily.  01/28/14   Historical Provider, MD  Na Sulfate-K Sulfate-Mg Sulf (SUPREP BOWEL PREP) SOLN Take 1 kit by mouth as directed. Patient not taking: Reported on 08/31/2015  06/29/15   Deshannon Hinchliffe M Gentry Seeber, MD  polyethylene glycol powder (GLYCOLAX/MIRALAX) powder Use 1 capful daily as needed for constipation Patient not taking: Reported on 08/31/2015 10/06/14   Anna W Sams, NP  Pseudoephedrine-Guaifenesin (MUCINEX D) 120-1200 MG TB12 Take 1 tablet by mouth daily.     Historical Provider, MD    Allergies as of 08/31/2015 - Review Complete 08/31/2015  Allergen Reaction Noted  . Chantix [varenicline]  02/23/2014  . Tomato Rash 06/16/2011    Family History  Problem Relation Age of Onset  . Heart attack Mother     deceased, age 50s  . Ulcers Brother   . Diabetes Brother   . Colon cancer Neg Hx   . Hypertension      Social History   Social History  . Marital Status: Married    Spouse Name: N/A  . Number of Children: 3  . Years of Education: N/A   Occupational History  . disability    Social History Main Topics  . Smoking status: Current Every Day Smoker -- 1.50 packs/day for 50 years    Types: Cigarettes  . Smokeless tobacco: Never Used     Comment: 1 pack per day as of 08/2012  . Alcohol Use: No     Comment: occasionally, usually holidays but sometimes on weekends, prior heavy use  . Drug Use: No  . Sexual Activity: Yes   Other Topics Concern  . Not on file   Social History Narrative    Review of Systems: See HPI, otherwise negative ROS  Physical Exam: BP 157/90 mmHg  Pulse 90  Temp(Src) 98.1 F (36.7 C) (Oral)  Ht 5' 6" (1.676 m)  Wt 211 lb 6.4 oz (95.89 kg)  BMI 34.14 kg/m2 General:   Somewhat disheveled in appearance. pleasant and cooperative in NAD Skin:  Intact without significant lesions or rashes. Eyes:  Sclera clear, no icterus.   Conjunctiva pink. Ears:  Normal auditory acuity. Nose:  No deformity, discharge,  or lesions. Mouth:  No deformity or lesions. Neck:  Supple; no masses or thyromegaly. No significant cervical adenopathy. Lungs:  Clear throughout to auscultation.   No wheezes, crackles, or rhonchi. No acute  distress. Heart:  Regular rate and rhythm; no murmurs, clicks, rubs,  or gallops. Abdomen: Non-distended, normal bowel sounds.  Soft and nontender without appreciable mass or hepatosplenomegaly.  Pulses:  Normal pulses noted. Extremities:  Without clubbing or edema.  Impression:  64-year-old gentleman with intermittent esophageal dysphagia on a background of GERD. He is chest pain not felt to be primarily cardiac in origin.  Esophageal dysphagia needs further evaluation. He is also due for screening colonoscopy-average risk at this time.  The risks, benefits, limitations, imponderables and alternatives regarding both EGD and colonoscopy have been reviewed with the patient. Questions have been answered. All parties agreeable.   Recommendations:    Schedule EGD with Esophageal dilation (GERD and Dysphagia)  Schedule a   screening colonoscopy  Split prep  Continue Protonix 40 mg daily for now   Notice: This dictation was prepared with Dragon dictation along with smaller phrase technology. Any transcriptional errors that result from this process are unintentional and may not be corrected upon review. 

## 2015-09-09 NOTE — Interval H&P Note (Signed)
History and Physical Interval Note:  09/09/2015 10:18 AM  Trevor Berg  has presented today for surgery, with the diagnosis of GERD.dysphagia, screening colonoscopy  The various methods of treatment have been discussed with the patient and family. After consideration of risks, benefits and other options for treatment, the patient has consented to  Procedure(s) with comments: COLONOSCOPY (N/A) - 1015 ESOPHAGOGASTRODUODENOSCOPY (EGD) (N/A) ESOPHAGEAL DILATION (N/A) as a surgical intervention .  The patient's history has been reviewed, patient examined, no change in status, stable for surgery.  I have reviewed the patient's chart and labs.  Questions were answered to the patient's satisfaction.     Trevor Berg   No change. EGD with/ED and screening TCS per plan.The risks, benefits, limitations, imponderables and alternatives regarding both EGD and colonoscopy have been reviewed with the patient. Questions have been answered. All parties agreeable.

## 2015-09-09 NOTE — Telephone Encounter (Signed)
Pt's wife called and stated that he cannot afford the Nicotine patches

## 2015-09-09 NOTE — Op Note (Signed)
[facility] [address] [place], [address]   ENDOSCOPY PROCEDURE REPORT  PATIENT: [name], [name]  MR#: [id number] BIRTHDATE: 1950/11/20 , 23  yrs. old GENDER: male ENDOSCOPIST: R.  Garfield Cornea, MD FACP FACG REFERRED BY:  Marjean Donna, M.D. PROCEDURE DATE:  2015-10-05 PROCEDURE:  EGD w/ biopsy and Maloney dilation of esophagus INDICATIONS:  Esophageal dysphagia; GERD. MEDICATIONS: Versed 7 mg IV and Demerol 100 mg IV in divided doses. Xylocaine gel orally.  Zofran 4 mg IV. ASA CLASS:      Class II  CONSENT: The risks, benefits, limitations, alternatives and imponderables have been discussed.  The potential for biopsy, esophogeal dilation, etc. have also been reviewed.  Questions have been answered.  All parties agreeable.  Please see the history and physical in the medical record for more information.  DESCRIPTION OF PROCEDURE: After the risks benefits and alternatives of the procedure were thoroughly explained, informed consent was obtained.  The EG-[id number] endoscope was introduced through the mouth and advanced to the second portion of the duodenum , limited by Without limitations. The instrument was slowly withdrawn as the mucosa was fully examined. Estimated blood loss is zero unless otherwise noted in this procedure report.    Normal-appearing, patent tubular esophagus.  Stomach empty.  Patient noted to have linear antral erosions.  No ulcer or infiltrating process.  Patent pylorus.  Normal-appearing first and second portion of the duodenum.  Scope was withdrawn and a 56 [place] Maloney dilator was passed to full insertion easily.  A look back revealed no apparent complications related to this maneuver.  Finally, biopsies of the abnormal gastric mucosa taken.  Retroflexed views revealed no abnormalities.     The scope was then withdrawn from the patient and the procedure completed.  COMPLICATIONS: There were no immediate  complications. EBL 3 mL ENDOSCOPIC IMPRESSION: Normal-appearing esophagus?"status post passage of a Maloney dilator. Antral erosions of uncertain significance?"status post gastric biopsy  RECOMMENDATIONS: Follow-up pathology. Continue Protonix 40 mg daily. See colonoscopy report.  REPEAT EXAM:  eSigned:  R. Garfield Cornea, MD Rosalita Chessman [facility] 10/05/2015 10:56 AM    CC:  CPT CODES: ICD CODES:  The ICD and CPT codes recommended by this software are interpretations from the data that the clinical staff has captured with the software.  The verification of the translation of this report to the ICD and CPT codes and modifiers is the sole responsibility of the health care institution and practicing physician where this report was generated.  [employer] will not be held responsible for the validity of the ICD and CPT codes included on this report.  AMA assumes no liability for data contained or not contained herein. CPT is a Designer, television/film set of the [employer].  PATIENT NAME:  Trevor Berg, Trevor Berg MR#: [id number]

## 2015-09-12 ENCOUNTER — Encounter: Payer: Self-pay | Admitting: Internal Medicine

## 2015-09-14 ENCOUNTER — Encounter (HOSPITAL_COMMUNITY): Payer: Self-pay | Admitting: Internal Medicine

## 2015-10-21 ENCOUNTER — Other Ambulatory Visit: Payer: Self-pay | Admitting: Gastroenterology

## 2015-12-28 ENCOUNTER — Ambulatory Visit (INDEPENDENT_AMBULATORY_CARE_PROVIDER_SITE_OTHER): Payer: Medicare HMO

## 2015-12-28 ENCOUNTER — Ambulatory Visit (INDEPENDENT_AMBULATORY_CARE_PROVIDER_SITE_OTHER): Payer: Medicare HMO | Admitting: Orthopaedic Surgery

## 2015-12-28 VITALS — BP 177/92 | HR 74 | Temp 98.1°F | Ht 67.0 in | Wt 215.2 lb

## 2015-12-28 DIAGNOSIS — M25561 Pain in right knee: Secondary | ICD-10-CM

## 2015-12-28 MED ORDER — HYDROCODONE-ACETAMINOPHEN 5-325 MG PO TABS: 1.0000 | ORAL_TABLET | ORAL | Status: DC | PRN

## 2015-12-28 NOTE — Patient Instructions (Signed)
Get MRI of the right knee.  Continue brace.

## 2015-12-28 NOTE — Progress Notes (Addendum)
CC: right knee pain  Subjective:    Patient ID: Trevor Berg, male    DOB: Dec 19, 1950, 65 y.o.   MRN: [id number]  Knee Pain  There was no injury mechanism. The pain is present in the right knee. The pain is at a severity of 6/10. The pain is moderate. The pain has been worsening since onset. Associated symptoms include an inability to bear weight and a loss of motion. Pertinent negatives include no loss of sensation, muscle weakness, numbness or tingling. The symptoms are aggravated by movement and weight bearing. He has tried ice, immobilization and NSAIDs for the symptoms. The treatment provided mild relief.      Review of Systems  Constitutional:       Patient does not have diabetes Patient has hypertension Patient has COPD Patient is a current smoker  HENT: Positive for sinus pressure, sore throat and tinnitus.   Respiratory: Positive for cough and shortness of breath.   Cardiovascular: Positive for chest pain and leg swelling.  Genitourinary: Positive for frequency.  Musculoskeletal: Positive for myalgias, joint swelling, arthralgias and gait problem.  Allergic/Immunologic: Positive for food allergies.  Neurological: Positive for dizziness. Negative for tingling and numbness.   The patient has a family history of diabetes, hypertension \ Social History   Social History  . Marital Status: Married    Spouse Name: N/A  . Number of Children: 3  . Years of Education: N/A   Occupational History  . disability    Social History Main Topics  . Smoking status: Current Every Day Smoker -- 1.50 packs/day for 50 years    Types: Cigarettes  . Smokeless tobacco: Never Used     Comment: 1 pack per day as of 08/2012  . Alcohol Use: No     Comment: occasionally, usually holidays but sometimes on weekends, prior heavy use  . Drug Use: No  . Sexual Activity: Yes   Other Topics Concern  . Not on file   Social History Narrative   Past Surgical History  Procedure Laterality Date    . Cervical spine surgery      C5-6 and C6-7: Relief of spinal stenosis with corpectomy, foraminotomy and fusion  . Orif ankle fracture      right  . Tonsillectomy    . Esophagogastroduodenoscopy  09/27/2004    RMR:   A couple of tiny, distal esophageal erosions, otherwise normal esophagus aside from a Schatzki ring status post Maloney dilation as described above/ Small hiatal hernia, otherwise normal stomach, normal D1-D2  . Colonoscopy  10/19/2004    RMR:   Friable anal canal, likely source of patient's hematochezia/  Otherwise normal rectum, normal colon.  . Orif tibia fracture      Left  . Esophagogastroduodenoscopy (egd) with esophageal dilation N/A 03/19/2013    [id number] esophagus s/p dilation/Abnormal stomach of uncertain clinical significance-s/p bx positive for H. pylori, status post Prevpac treatment  . Colonoscopy N/A 09/09/2015    Procedure: COLONOSCOPY;  Surgeon: Daneil Dolin, MD;  Location: [facility];  Service: Endoscopy;  Laterality: N/A;  1015  . Esophagogastroduodenoscopy N/A 09/09/2015    Procedure: ESOPHAGOGASTRODUODENOSCOPY (EGD);  Surgeon: Daneil Dolin, MD;  Location: [facility];  Service: Endoscopy;  Laterality: N/A;  . Esophageal dilation N/A 09/09/2015    Procedure: ESOPHAGEAL DILATION;  Surgeon: Daneil Dolin, MD;  Location: [facility];  Service: Endoscopy;  Laterality: N/A;       BP 177/92 mmHg  Pulse 74  Temp(Src) 98.1 F (  36.7 C)  Ht 5\' 7"  (1.702 m)  Wt 215 lb 3.2 oz (97.614 kg)  BMI 33.70 kg/m2  Objective:   Physical Exam  Constitutional: He is oriented to person, place, and time. He appears well-developed and well-nourished.  HENT:  Head: Normocephalic and atraumatic.  Eyes: Conjunctivae and EOM are normal. Pupils are equal, round, and reactive to light.  Neck: Normal range of motion. Neck supple.  Cardiovascular: Normal rate, regular rhythm, normal heart sounds and intact distal pulses.   Pulmonary/Chest: Breath sounds normal.   Abdominal: Soft.  Musculoskeletal: He exhibits tenderness (Pain of the right knee.  He has a knee brace.  He has swelling and crepitus.  ROM 0 to 100).       Right knee: He exhibits decreased range of motion, swelling and effusion. Tenderness found. Medial joint line tenderness noted.       Legs: Neurological: He is alert and oriented to person, place, and time. He has normal reflexes. He displays normal reflexes. No cranial nerve deficit. He exhibits normal muscle tone. Coordination normal.  Skin: Skin is warm.  Psychiatric: He has a normal mood and affect. His behavior is normal. Judgment and thought content normal.    The right lower extremity is examined:  Inspection:  Thigh:  Non-tender and no defects  Knee has swelling 2+ effusion.                        Joint tenderness is present                        Patient is tender over the medial joint line  Lower Leg:  Has normal appearance and no tenderness or defects  Ankle:  Non-tender and no defects  Foot:  Non-tender and no defects Range of Motion:  Knee:  Range of motion is: 0 to 100                        Crepitus is  present  Ankle:  Range of motion is normal. Strength and Tone:  The right lower extremity has normal strength and tone. Stability:  Knee:  The knee has weakly positive McMurray medially  Ankle:  The ankle is stable.  The patient request injection, verbal consent was obtained.  The right knee was prepped appropriately after time out was performed.   Sterile technique was observed and injection of 1 cc of Depo-Medrol 40 mg with several cc's of plain xylocaine. Anesthesia was provided by ethyl chloride and a 20-gauge needle was used to inject the knee area. The injection was tolerated well.  A band aid dressing was applied.  The patient was advised to apply ice later today and tomorrow to the injection sight as needed. His hypertension is well controlled he states.  He smokes and has no desire at this time to  stop. He might cut back some.  I have told him about getting a MRI of the knee.  He is agreeable to this.  X-rays of the knee on the right was done and in a separate report. Encounter Diagnosis  Name Primary?  . Right knee pain Yes       Assessment & Plan:  Right knee pain.  I am concerned about a meniscus tear.  He needs a MRI of the right knee.  He needs to continue his knee brace.

## 2016-01-12 ENCOUNTER — Ambulatory Visit (HOSPITAL_COMMUNITY)
Admission: RE | Admit: 2016-01-12 | Discharge: 2016-01-12 | Disposition: A | Discharge status: Home / Self Care | Payer: Medicare HMO | Source: Ambulatory Visit | Attending: Orthopaedic Surgery | Admitting: Orthopaedic Surgery

## 2016-01-12 DIAGNOSIS — M1711 Unilateral primary osteoarthritis, right knee: Secondary | ICD-10-CM | POA: Insufficient documentation

## 2016-01-12 DIAGNOSIS — M25561 Pain in right knee: Secondary | ICD-10-CM | POA: Insufficient documentation

## 2016-01-12 DIAGNOSIS — S83231A Complex tear of medial meniscus, current injury, right knee, initial encounter: Secondary | ICD-10-CM | POA: Diagnosis not present

## 2016-01-12 DIAGNOSIS — M25461 Effusion, right knee: Secondary | ICD-10-CM | POA: Diagnosis not present

## 2016-01-26 NOTE — Addendum Note (Signed)
Addended by: Willette Pa on: 01/26/2016 09:35 PM   Modules accepted: Miquel Dunn

## 2016-06-30 ENCOUNTER — Other Ambulatory Visit: Payer: Self-pay

## 2016-06-30 MED ORDER — ATENOLOL 50 MG PO TABS: ORAL_TABLET | ORAL | 0 refills | Status: DC

## 2016-06-30 NOTE — Telephone Encounter (Signed)
Needs f/u apt for refills.

## 2016-07-20 ENCOUNTER — Other Ambulatory Visit: Payer: Self-pay | Admitting: Cardiology

## 2016-07-20 MED ORDER — LISINOPRIL 40 MG PO TABS: 40.0000 mg | ORAL_TABLET | Freq: Every day | ORAL | 1 refills | Status: DC

## 2016-07-20 NOTE — Telephone Encounter (Signed)
Hospital discharge summary has pt taking lisinopril 40 mg as does Dr Nelly Laurence note,refilled,has apt next week in [place] office

## 2016-07-26 ENCOUNTER — Other Ambulatory Visit: Payer: Self-pay | Admitting: Cardiology

## 2016-07-26 ENCOUNTER — Encounter: Payer: Self-pay | Admitting: *Deleted

## 2016-07-27 ENCOUNTER — Encounter: Payer: Self-pay | Admitting: Cardiology

## 2016-07-27 ENCOUNTER — Ambulatory Visit (INDEPENDENT_AMBULATORY_CARE_PROVIDER_SITE_OTHER): Payer: Medicare HMO | Admitting: Cardiology

## 2016-07-27 VITALS — BP 129/74 | HR 74 | Ht 66.0 in | Wt 217.8 lb

## 2016-07-27 DIAGNOSIS — R0789 Other chest pain: Secondary | ICD-10-CM

## 2016-07-27 DIAGNOSIS — I10 Essential (primary) hypertension: Secondary | ICD-10-CM

## 2016-07-27 DIAGNOSIS — E785 Hyperlipidemia, unspecified: Secondary | ICD-10-CM

## 2016-07-27 NOTE — Patient Instructions (Addendum)
Your physician wants you to follow-up in: 6 MONTHS WITH DR BRANCH IN [place] You will receive a reminder letter in the mail two months in advance. If you don't receive a letter, please call our office to schedule the follow-up appointment.  Your physician recommends that you continue on your current medications as directed. Please refer to the Current Medication list given to you today.  Thank you for choosing [facility]!!    

## 2016-07-27 NOTE — Progress Notes (Signed)
Clinical Summary Mr. Brickler is a 65 y.o.male seen today for follow up of the following medical problems  1.CAD - long history of chest pain - cath in 2005 without mild to moderate non-obstructive disease - negative stress echo 09/2012, baseline LVEF 55-60% - following a visit with PA Lenze a Lexiscan was ordered due to chest pain. It showed an inferior defect due to subdiaphragmatic attenuation, no ischemia.  - of note he has history of GERD with esophageal dysphagia followed by GI. Has upcoming EGD.  - pain unchanged since last visit. Pain atypical in that it can last can several last up to 24 hrs constantly.   - still with chest pain since last visit. Can be dull or sharp pain, 7/10. Can occur at rest or with activity. Constant for several hours at time. Not positional. Can have some nausea, +SOB. Increase in severity recently - DOE at <1/2 block which is chronic - can have some LE edema at times.    2. COPD - compliant with meds  3. HTN - compliant with meds   4. HL - reports recent labs with pcp      Past Medical History:  Diagnosis Date  . Anginal pain ([place])   . Anxiety   . Asthma   . Chest pain 2005   noncritical coronary disease in 2005: 40% distal RCA, 30% CX, 50% OM 2, EF-60%,  . COPD (chronic obstructive pulmonary disease) ([facility])    Chronic bronchitis; 100-pack-year cigarette consumption  . Gastroesophageal reflux disease    possible small Schatzki's ring; esophageal dilatation in 2005  . H. pylori infection 2014   treated with prevpac  . Hyperlipidemia   . Hypertension   . Overweight(278.02)   . Tobacco abuse    100 pack years     Allergies  Allergen Reactions  . Chantix [Varenicline]     Nightmares and personality changes.  . Tomato Rash     Current Outpatient Prescriptions  Medication Sig Dispense Refill  . albuterol (PROVENTIL HFA;VENTOLIN HFA) 108 (90 BASE) MCG/ACT inhaler Inhale 2 puffs into the lungs every 6 (six) hours as  needed. Shortness of Breath    . amLODipine (NORVASC) 2.5 MG tablet Take 2.5 mg by mouth daily.    Jearl Klinefelter ELLIPTA 62.5-25 MCG/INH AEPB Inhale 1 puff into the lungs daily.     Marland Kitchen aspirin EC 81 MG tablet Take 81 mg by mouth daily.    Marland Kitchen atenolol (TENORMIN) 50 MG tablet TAKE ONE TABLET BY MOUTH ONCE DAILY IN THE MORNING 30 tablet 3  . atorvastatin (LIPITOR) 20 MG tablet Take 20 mg by mouth daily.     Marland Kitchen DEXILANT 60 MG capsule Take 60 mg by mouth daily.     Marland Kitchen dextromethorphan-guaiFENesin (MUCINEX DM) 30-600 MG 12hr tablet Take 1 tablet by mouth 2 (two) times daily.    Marland Kitchen ibuprofen (ADVIL,MOTRIN) 800 MG tablet Take 1 tablet by mouth 3 (three) times daily as needed for moderate pain.     Marland Kitchen lisinopril (PRINIVIL,ZESTRIL) 40 MG tablet Take 1 tablet (40 mg total) by mouth daily. 90 tablet 1  . LORazepam (ATIVAN) 1 MG tablet Take 1 mg by mouth every 4 (four) hours as needed. For nerves    . nitroGLYCERIN (NITROSTAT) 0.4 MG SL tablet Place 0.4 mg under the tongue every 5 (five) minutes as needed (used today 06/29/2015). Chest Pain    . polyethylene glycol powder (GLYCOLAX/MIRALAX) powder USE 1 CAPFUL DAILY AS NEEDED FOR CONSTIPATION 527 g 11  .  pantoprazole (PROTONIX) 40 MG tablet Take 1 tablet (40 mg total) by mouth daily. (Patient not taking: Reported on 07/27/2016) 90 tablet 3   No current facility-administered medications for this visit.      Past Surgical History:  Procedure Laterality Date  . CERVICAL SPINE SURGERY     C5-6 and C6-7: Relief of spinal stenosis with corpectomy, foraminotomy and fusion  . COLONOSCOPY  10/19/2004   RMR:   Friable anal canal, likely source of patient's hematochezia/  Otherwise normal rectum, normal colon.  . COLONOSCOPY N/A 09/09/2015   Procedure: COLONOSCOPY;  Surgeon: Daneil Dolin, MD;  [facility]: [facility];  Service: Endoscopy;  Laterality: N/A;  1015  . ESOPHAGEAL DILATION N/A 09/09/2015   Procedure: ESOPHAGEAL DILATION;  Surgeon: Daneil Dolin, MD;  Location:  [facility];  Service: Endoscopy;  Laterality: N/A;  . ESOPHAGOGASTRODUODENOSCOPY  09/27/2004   RMR:   A couple of tiny, distal esophageal erosions, otherwise normal esophagus aside from a Schatzki ring status post Maloney dilation as described above/ Small hiatal hernia, otherwise normal stomach, normal D1-D2  . ESOPHAGOGASTRODUODENOSCOPY N/A 09/09/2015   Procedure: ESOPHAGOGASTRODUODENOSCOPY (EGD);  Surgeon: Daneil Dolin, MD;  [facility];  Service: Endoscopy;  Laterality: N/A;  . ESOPHAGOGASTRODUODENOSCOPY (EGD) WITH ESOPHAGEAL DILATION N/A 03/19/2013   [id number] esophagus s/p dilation/Abnormal stomach of uncertain clinical significance-s/p bx positive for H. pylori, status post Prevpac treatment  . ORIF ANKLE FRACTURE     right  . ORIF TIBIA FRACTURE     Left  . TONSILLECTOMY       Allergies  Allergen Reactions  . Chantix [Varenicline]     Nightmares and personality changes.  . Tomato Rash      Family History  Problem Relation Age of Onset  . Heart attack Mother     deceased, age 49s  . Ulcers Brother   . Diabetes Brother   . Hypertension    . Colon cancer Neg Hx      Social History Mr. Farha reports that he has been smoking Cigarettes.  He has a 50.00 pack-year smoking history. He has never used smokeless tobacco. Mr. Visalli reports that he does not drink alcohol.   Review of Systems CONSTITUTIONAL: No weight loss, fever, chills, weakness or fatigue.  HEENT: Eyes: No visual loss, blurred vision, double vision or yellow sclerae.No hearing loss, sneezing, congestion, runny nose or sore throat.  SKIN: No rash or itching.  CARDIOVASCULAR: per HPI RESPIRATORY: No shortness of breath, cough or sputum.  GASTROINTESTINAL: No anorexia, nausea, vomiting or diarrhea. No abdominal pain or blood.  GENITOURINARY: No burning on urination, no polyuria NEUROLOGICAL: No headache, dizziness, syncope, paralysis, ataxia, numbness or tingling in the extremities. No  change in bowel or bladder control.  MUSCULOSKELETAL: No muscle, back pain, joint pain or stiffness.  LYMPHATICS: No enlarged nodes. No history of splenectomy.  PSYCHIATRIC: No history of depression or anxiety.  ENDOCRINOLOGIC: No reports of sweating, cold or heat intolerance. No polyuria or polydipsia.  Marland Kitchen   Physical Examination Vitals:   07/27/16 1056  BP: 129/74  Pulse: 74   Filed Weights   07/27/16 1056  Weight: 217 lb 12.8 oz (98.8 kg)    Gen: resting comfortably, no acute distress HEENT: no scleral icterus, pupils equal round and reactive, no palptable cervical adenopathy,  CV: RRR, no mrg, no jvd Resp: Clear to auscultation bilaterally GI: abdomen is soft, non-tender, non-distended, normal bowel sounds, no hepatosplenomegaly MSK: extremities are warm, no edema.  Skin: warm,  no rash Neuro:  no focal deficits Psych: appropriate affect   Diagnostic Studies 07/2004 Cath The left main is a large vessel with no significant disease.  The LAD is a medium size vessel which courses three quarters of the way down the anterior wall and has no significant disease. There are two small diagonal branches with no significant disease.  The left circumflex is a large vessel coursing through the AV groove. The third obtuse marginal Dimitri Shakespeare is free of the AV groove circumflex and noted to have a mild 30% narrowing after the takeoff the second OM.  The first OM is a small vessel which bifurcates in the proximal portion with no significant disease.  The second OM is a large vessel which bifurcates distally and has 50% mid vessel stenosis.  The third OM is a small vessel with no significant disease.  The right coronary artery is a large vessel which is dominant and gives rise to both the PDA as well as the posterolateral Sherrina Zaugg. The RCA is ectatic but has no high grade stenosis.  The PDA is a medium size vessel with no significant disease.  The PLA is a  large vessel which bifurcates in the mid segment. There is 40% lesion in the proximal portion fo the lower bifurcation.  Left ventriculogram reveals a preserved ejection fraction of 60%.  Hemodynamics: The systemic arterial pressure was 112/69. Left ventricular pressure was 115/8. Left ventricular end diastolic pressure was 13.  CONCLUSION: 1. Non-critical coronary artery disease. 2. Normal left ventricular systolic function.   09/2012 Stress echo Study Conclusions  - Stress: There was resting hypertension with a hypertensive response to stress. Atypical chest pain was present before and during exercise without increase in intensity, and it persisted following conclusion of the test. - Stress ECG conclusions: No diagnostic ST abnormalities or arrhythmias. The stress ECG was negative for ischemia. Duke scoring: exercise time of 65min; maximum ST deviation of 59mm; angina present but did not limit exercise; resulting score is -1. This score predicts a moderate risk of cardiac events. - Staged echo: There was no echocardiographic evidence for stress-induced ischemia. - Peak stress: LV global systolic function was vigorous. The estimated LV ejection fraction was 70% to 75%. No evidence for new LV regional wall motion abnormalities.   07/2015 Nuclear stress test  There was no ST segment deviation noted during stress.  Defect 1: There is a medium defect of mild severity present in the basal inferior, mid inferior and apical inferior location, which is likely due to soft tissue attenuation artifact. A mild degree of myocardial scar cannot entirely be ruled out.  This is a low risk study.  Nuclear stress EF: 62%. Assessment and Plan  1.Chest pain - long history of atypical chest pain, symptoms overall unchanged over several years - negative ischemic evaluations in the past including cath in 2005, and most recently a lexiscan negative for  ischemia 07/2015 - no further workup at this time. He has upcoming f/u with GI as well.  - EKG in clinic shows SR, no ischemic changes  2. HTN - he is at goal, continue current meds  3. HL - continue staitn     F/u 6 months.       Arnoldo Lenis, M.D.

## 2016-11-06 ENCOUNTER — Other Ambulatory Visit: Payer: Self-pay

## 2016-11-06 MED ORDER — LISINOPRIL 40 MG PO TABS: 40.0000 mg | ORAL_TABLET | Freq: Every day | ORAL | 3 refills | Status: DC

## 2016-11-06 NOTE — Telephone Encounter (Signed)
Refilled lisinopril as requested.

## 2016-11-07 ENCOUNTER — Other Ambulatory Visit: Payer: Self-pay

## 2016-11-08 MED ORDER — POLYETHYLENE GLYCOL 3350 17 GM/SCOOP PO POWD: ORAL | 11 refills | Status: AC

## 2017-02-01 ENCOUNTER — Ambulatory Visit (INDEPENDENT_AMBULATORY_CARE_PROVIDER_SITE_OTHER): Payer: Medicare HMO | Admitting: Cardiology

## 2017-02-01 ENCOUNTER — Encounter: Payer: Self-pay | Admitting: Cardiology

## 2017-02-01 VITALS — BP 116/68 | HR 77 | Ht 66.0 in | Wt 208.0 lb

## 2017-02-01 DIAGNOSIS — E782 Mixed hyperlipidemia: Secondary | ICD-10-CM

## 2017-02-01 DIAGNOSIS — I10 Essential (primary) hypertension: Secondary | ICD-10-CM

## 2017-02-01 DIAGNOSIS — R0789 Other chest pain: Secondary | ICD-10-CM | POA: Diagnosis not present

## 2017-02-01 MED ORDER — PANTOPRAZOLE SODIUM 40 MG PO TBEC: 40.0000 mg | DELAYED_RELEASE_TABLET | Freq: Every day | ORAL | 3 refills | Status: DC

## 2017-02-01 NOTE — Progress Notes (Signed)
Clinical Summary Trevor Berg is a 66 y.o.male seen today for follow up of the following medical problems  1.CAD - long history of chest pain - cath in 2005 without mild to moderate non-obstructive disease - negative stress echo 09/2012, baseline LVEF 55-60% - following a visit with PA Lenze a Lexiscan was ordered due to chest pain. It showed an inferior defect due to subdiaphragmatic attenuation, no ischemia.  - of note he has history of GERD with esophageal dysphagia followed by GI. Has upcoming EGD.  - pain unchanged since last visit. Pain atypical in that it can last can several last up to 24 hrs constantly.     - still with chest pains overall unchanged. Has upcoming appointment with GI. - stable SOB/DOE.    2. COPD - compliant with inhalers  3. HTN - compliant with meds   4. Hyperlipidemia  - upcoming labs with pcp - he is compliant with staitn  5. AAA screen - history of smoking, will require AAA [place] later this year at age 74   Past Medical History:  Diagnosis Date  . Anginal pain ([place])   . Anxiety   . Asthma   . Chest pain 2005   noncritical coronary disease in 2005: 40% distal RCA, 30% CX, 50% OM 2, EF-60%,  . COPD (chronic obstructive pulmonary disease) ([facility])    Chronic bronchitis; 100-pack-year cigarette consumption  . Gastroesophageal reflux disease    possible small Schatzki's ring; esophageal dilatation in 2005  . H. pylori infection 2014   treated with prevpac  . Hyperlipidemia   . Hypertension   . Overweight(278.02)   . Tobacco abuse    100 pack years     Allergies  Allergen Reactions  . Chantix [Varenicline]     Nightmares and personality changes.  . Tomato Rash     Current Outpatient Prescriptions  Medication Sig Dispense Refill  . albuterol (PROVENTIL HFA;VENTOLIN HFA) 108 (90 BASE) MCG/ACT inhaler Inhale 2 puffs into the lungs every 6 (six) hours as needed. Shortness of Breath    . amLODipine (NORVASC) 2.5 MG tablet Take  2.5 mg by mouth daily.    Jearl Klinefelter ELLIPTA 62.5-25 MCG/INH AEPB Inhale 1 puff into the lungs daily.     Marland Kitchen aspirin EC 81 MG tablet Take 81 mg by mouth daily.    Marland Kitchen atenolol (TENORMIN) 50 MG tablet TAKE ONE TABLET BY MOUTH ONCE DAILY IN THE MORNING 30 tablet 3  . atorvastatin (LIPITOR) 20 MG tablet Take 20 mg by mouth daily.     Marland Kitchen DEXILANT 60 MG capsule Take 60 mg by mouth daily.     Marland Kitchen dextromethorphan-guaiFENesin (MUCINEX DM) 30-600 MG 12hr tablet Take 1 tablet by mouth 2 (two) times daily.    Marland Kitchen ibuprofen (ADVIL,MOTRIN) 800 MG tablet Take 1 tablet by mouth 3 (three) times daily as needed for moderate pain.     Marland Kitchen lisinopril (PRINIVIL,ZESTRIL) 40 MG tablet Take 1 tablet (40 mg total) by mouth daily. 90 tablet 3  . LORazepam (ATIVAN) 1 MG tablet Take 1 mg by mouth every 4 (four) hours as needed. For nerves    . nitroGLYCERIN (NITROSTAT) 0.4 MG SL tablet Place 0.4 mg under the tongue every 5 (five) minutes as needed (used today 06/29/2015). Chest Pain    . pantoprazole (PROTONIX) 40 MG tablet Take 1 tablet (40 mg total) by mouth daily. 90 tablet 3  . polyethylene glycol powder (GLYCOLAX/MIRALAX) powder USE 1 CAPFUL DAILY AS NEEDED FOR CONSTIPATION 527  g 11   No current facility-administered medications for this visit.      Past Surgical History:  Procedure Laterality Date  . CERVICAL SPINE SURGERY     C5-6 and C6-7: Relief of spinal stenosis with corpectomy, foraminotomy and fusion  . COLONOSCOPY  10/19/2004   RMR:   Friable anal canal, likely source of patient's hematochezia/  Otherwise normal rectum, normal colon.  . COLONOSCOPY N/A 09/09/2015   Procedure: COLONOSCOPY;  Surgeon: Daneil Dolin, MD;  [facility];  Service: Endoscopy;  Laterality: N/A;  1015  . ESOPHAGEAL DILATION N/A 09/09/2015   Procedure: ESOPHAGEAL DILATION;  Surgeon: Daneil Dolin, MD;  [facility];  Service: Endoscopy;  Laterality: N/A;  . ESOPHAGOGASTRODUODENOSCOPY  09/27/2004   RMR:   A couple  of tiny, distal esophageal erosions, otherwise normal esophagus aside from a Schatzki ring status post Maloney dilation as described above/ Small hiatal hernia, otherwise normal stomach, normal D1-D2  . ESOPHAGOGASTRODUODENOSCOPY N/A 09/09/2015   Procedure: ESOPHAGOGASTRODUODENOSCOPY (EGD);  Surgeon: Daneil Dolin, MD;  [facility];  Service: Endoscopy;  Laterality: N/A;  . ESOPHAGOGASTRODUODENOSCOPY (EGD) WITH ESOPHAGEAL DILATION N/A 03/19/2013   IRC:VELFYB esophagus s/p dilation/Abnormal stomach of uncertain clinical significance-s/p bx positive for H. pylori, status post Prevpac treatment  . ORIF ANKLE FRACTURE     right  . ORIF TIBIA FRACTURE     Left  . TONSILLECTOMY       Allergies  Allergen Reactions  . Chantix [Varenicline]     Nightmares and personality changes.  . Tomato Rash      Family History  Problem Relation Age of Onset  . Heart attack Mother     deceased, age 54s  . Ulcers Brother   . Diabetes Brother   . Hypertension    . Colon cancer Neg Hx      Social History Trevor Berg reports that he has been smoking Cigarettes.  He has a 50.00 pack-year smoking history. He has never used smokeless tobacco. Trevor Berg reports that he does not drink alcohol.   Review of Systems CONSTITUTIONAL: No weight loss, fever, chills, weakness or fatigue.  HEENT: Eyes: No visual loss, blurred vision, double vision or yellow sclerae.No hearing loss, sneezing, congestion, runny nose or sore throat.  SKIN: No rash or itching.  CARDIOVASCULAR: per hpi RESPIRATORY: No shortness of breath, cough or sputum.  GASTROINTESTINAL: No anorexia, nausea, vomiting or diarrhea. No abdominal pain or blood.  GENITOURINARY: No burning on urination, no polyuria NEUROLOGICAL: No headache, dizziness, syncope, paralysis, ataxia, numbness or tingling in the extremities. No change in bowel or bladder control.  MUSCULOSKELETAL: No muscle, back pain, joint pain or stiffness.  LYMPHATICS: No  enlarged nodes. No history of splenectomy.  PSYCHIATRIC: No history of depression or anxiety.  ENDOCRINOLOGIC: No reports of sweating, cold or heat intolerance. No polyuria or polydipsia.  Marland Kitchen   Physical Examination Vitals:   02/01/17 0816  BP: 116/68  Pulse: 77   Filed Weights   02/01/17 0816  Weight: 208 lb (94.3 kg)    Gen: resting comfortably, no acute distress HEENT: no scleral icterus, pupils equal round and reactive, no palptable cervical adenopathy,  CV: RRR, no m/r/g, no jvd Resp: Clear to auscultation bilaterally GI: abdomen is soft, non-tender, non-distended, normal bowel sounds, no hepatosplenomegaly MSK: extremities are warm, no edema.  Skin: warm, no rash Neuro:  no focal deficits Psych: appropriate affect   Diagnostic Studies 07/2004 Cath The left main is a large vessel  with no significant disease.  The LAD is a medium size vessel which courses three quarters of the way down the anterior wall and has no significant disease. There are two small diagonal branches with no significant disease.  The left circumflex is a large vessel coursing through the AV groove. The third obtuse marginal Lelend Heinecke is free of the AV groove circumflex and noted to have a mild 30% narrowing after the takeoff the second OM.  The first OM is a small vessel which bifurcates in the proximal portion with no significant disease.  The second OM is a large vessel which bifurcates distally and has 50% mid vessel stenosis.  The third OM is a small vessel with no significant disease.  The right coronary artery is a large vessel which is dominant and gives rise to both the PDA as well as the posterolateral Mansoor Hillyard. The RCA is ectatic but has no high grade stenosis.  The PDA is a medium size vessel with no significant disease.  The PLA is a large vessel which bifurcates in the mid segment. There is 40% lesion in the proximal portion fo the lower  bifurcation.  Left ventriculogram reveals a preserved ejection fraction of 60%.  Hemodynamics: The systemic arterial pressure was 112/69. Left ventricular pressure was 115/8. Left ventricular end diastolic pressure was 13.  CONCLUSION: 1. Non-critical coronary artery disease. 2. Normal left ventricular systolic function.   09/2012 Stress echo Study Conclusions  - Stress: There was resting hypertension with a hypertensive response to stress. Atypical chest pain was present before and during exercise without increase in intensity, and it persisted following conclusion of the test. - Stress ECG conclusions: No diagnostic ST abnormalities or arrhythmias. The stress ECG was negative for ischemia. Duke scoring: exercise time of 84min; maximum ST deviation of 13mm; angina present but did not limit exercise; resulting score is -1. This score predicts a moderate risk of cardiac events. - Staged echo: There was no echocardiographic evidence for stress-induced ischemia. - Peak stress: LV global systolic function was vigorous. The estimated LV ejection fraction was 70% to 75%. No evidence for new LV regional wall motion abnormalities.   07/2015 Nuclear stress test  There was no ST segment deviation noted during stress.  Defect 1: There is a medium defect of mild severity present in the basal inferior, mid inferior and apical inferior location, which is likely due to soft tissue attenuation artifact. A mild degree of myocardial scar cannot entirely be ruled out.  This is a low risk study.  Nuclear stress EF: 62%.    Assessment and Plan  1.Chest pain - long history of atypical chest pain, symptoms overall unchanged over several years - negative ischemic evaluations in the past including cath in 2005, and most recently a lexiscan negative for ischemia 07/2015 - He has upcoming f/u with GI as well. No further cardiac testing at this time.  - EKG  in clinic shows SR, no ischemic changes  2. HTN - his bp at goal, he will continue current meds  3. HL - continue staitn - reqeust labs from pcp   F/u 6 months.    Arnoldo Lenis, M.D., F.A.C.C.

## 2017-02-01 NOTE — Patient Instructions (Signed)
Your physician recommends that you continue on your current medications as directed. Please refer to the Current Medication list given to you today.   I will request a copy of your labs from pcp.    Your physician wants you to follow-up in: 6 months.  You will receive a reminder letter in the mail two months in advance. If you don't receive a letter, please call our office to schedule the follow-up appointment.   If you need a refill on your cardiac medications before your next appointment, please call your pharmacy.

## 2017-03-09 ENCOUNTER — Encounter: Payer: Self-pay | Admitting: Internal Medicine

## 2017-03-09 ENCOUNTER — Other Ambulatory Visit: Payer: Self-pay

## 2017-03-09 ENCOUNTER — Ambulatory Visit (INDEPENDENT_AMBULATORY_CARE_PROVIDER_SITE_OTHER): Payer: Medicare HMO | Admitting: Internal Medicine

## 2017-03-09 VITALS — BP 173/98 | HR 65 | Temp 97.0°F | Ht 66.0 in | Wt 207.0 lb

## 2017-03-09 DIAGNOSIS — G8929 Other chronic pain: Secondary | ICD-10-CM

## 2017-03-09 DIAGNOSIS — R1319 Other dysphagia: Secondary | ICD-10-CM

## 2017-03-09 DIAGNOSIS — K5909 Other constipation: Secondary | ICD-10-CM

## 2017-03-09 DIAGNOSIS — K219 Gastro-esophageal reflux disease without esophagitis: Secondary | ICD-10-CM | POA: Diagnosis not present

## 2017-03-09 DIAGNOSIS — R1310 Dysphagia, unspecified: Secondary | ICD-10-CM

## 2017-03-09 DIAGNOSIS — R1011 Right upper quadrant pain: Secondary | ICD-10-CM

## 2017-03-09 NOTE — Patient Instructions (Signed)
Schedule EGD with possible ED; Dysphagia and upper abdominal pain - conscious sedation  Continue Protonix 40 mg daily  Trial of [name] 145 daily samples provided  Further recommendations to follow

## 2017-03-09 NOTE — Progress Notes (Signed)
Primary Care Physician:  Jani Gravel, MD Primary Gastroenterologist:  Dr. Gala Romney  Pre-Procedure History & Physical: HPI:  Trevor Berg is a 66 y.o. male here for further evaluation of abdominal pain. Patient states he's had unrelenting mild upper abdominal pain for years. Maybe a little bit worse after eating a meal; chronically constipated states he's become "immune" to MiraLAX. Denies rectal bleeding or melena. Weight is down 4 pounds since when I saw him back in 2016. At that time, he had a mild chemical gastritis on EGD with biopsy. TCS revealed hyperplastic and adenomatous polyp.   No H pylori. Esophagus empirically dilated for dysphagia. Help for short time and then has recurred. Solid food dysphagia. Reviewed recent labs to Dr. Julianne Rice office. LFTs, renal function and CBC all good.  He continues to smoke. Takes ibuprofen multiple times daily.  Gallbladder appeared normal on 2013 ultrasound.  Past Medical History:  Diagnosis Date  . Anginal pain ([place])   . Anxiety   . Asthma   . Chest pain 2005   noncritical coronary disease in 2005: 40% distal RCA, 30% CX, 50% OM 2, EF-60%,  . COPD (chronic obstructive pulmonary disease) (HCC)    Chronic bronchitis; 100-pack-year cigarette consumption  . Gastroesophageal reflux disease    possible small Schatzki's ring; esophageal dilatation in 2005  . H. pylori infection 2014   treated with prevpac  . Hyperlipidemia   . Hypertension   . Overweight(278.02)   . Tobacco abuse    100 pack years    Past Surgical History:  Procedure Laterality Date  . CERVICAL SPINE SURGERY     C5-6 and C6-7: Relief of spinal stenosis with corpectomy, foraminotomy and fusion  . COLONOSCOPY  10/19/2004   RMR:   Friable anal canal, likely source of patient's hematochezia/  Otherwise normal rectum, normal colon.  . COLONOSCOPY N/A 09/09/2015   Procedure: COLONOSCOPY;  Surgeon: Daneil Dolin, MD;  [facility];  Service: Endoscopy;  Laterality: N/A;   1015  . ESOPHAGEAL DILATION N/A 09/09/2015   Procedure: ESOPHAGEAL DILATION;  Surgeon: Daneil Dolin, MD;  [facility];  Service: Endoscopy;  Laterality: N/A;  . ESOPHAGOGASTRODUODENOSCOPY  09/27/2004   RMR:   A couple of tiny, distal esophageal erosions, otherwise normal esophagus aside from a Schatzki ring status post Maloney dilation as described above/ Small hiatal hernia, otherwise normal stomach, normal D1-D2  . ESOPHAGOGASTRODUODENOSCOPY N/A 09/09/2015   Procedure: ESOPHAGOGASTRODUODENOSCOPY (EGD);  Surgeon: Daneil Dolin, MD;  [facility];  Service: Endoscopy;  Laterality: N/A;  . ESOPHAGOGASTRODUODENOSCOPY (EGD) WITH ESOPHAGEAL DILATION N/A 03/19/2013   UGQ:BVQXIH esophagus s/p dilation/Abnormal stomach of uncertain clinical significance-s/p bx positive for H. pylori, status post Prevpac treatment  . ORIF ANKLE FRACTURE     right  . ORIF TIBIA FRACTURE     Left  . TONSILLECTOMY      Prior to Admission medications   Medication Sig Start Date End Date Taking? Authorizing Provider  albuterol (PROVENTIL HFA;VENTOLIN HFA) 108 (90 BASE) MCG/ACT inhaler Inhale 2 puffs into the lungs every 6 (six) hours as needed. Shortness of Breath   Yes [provider]  amLODipine (NORVASC) 2.5 MG tablet Take 2.5 mg by mouth daily.   Yes [provider]  ANORO ELLIPTA 62.5-25 MCG/INH AEPB Inhale 1 puff into the lungs daily.  03/08/15  Yes [provider]  aspirin EC 81 MG tablet Take 81 mg by mouth daily.   Yes [provider]  atenolol (  TENORMIN) 50 MG tablet TAKE ONE TABLET BY MOUTH ONCE DAILY IN THE MORNING 07/27/16  Yes Branch, Alphonse Guild, MD  atorvastatin (LIPITOR) 20 MG tablet Take 20 mg by mouth daily.  02/09/15  Yes [provider]  DEXILANT 60 MG capsule Take 60 mg by mouth daily.  01/28/14  Yes [provider]  dextromethorphan-guaiFENesin (MUCINEX DM) 30-600 MG 12hr tablet Take 1 tablet by mouth 2 (two) times daily.   Yes  [provider]  ibuprofen (ADVIL,MOTRIN) 800 MG tablet Take 1 tablet by mouth 3 (three) times daily as needed for moderate pain.  04/26/15  Yes [provider]  lisinopril (PRINIVIL,ZESTRIL) 40 MG tablet Take 1 tablet (40 mg total) by mouth daily. 11/06/16  Yes Branch, Alphonse Guild, MD  LORazepam (ATIVAN) 1 MG tablet Take 1 mg by mouth every 4 (four) hours as needed. For nerves   Yes [provider]  nitroGLYCERIN (NITROSTAT) 0.4 MG SL tablet Place 0.4 mg under the tongue every 5 (five) minutes as needed (used today 06/29/2015). Chest Pain   Yes [provider]  polyethylene glycol powder (GLYCOLAX/MIRALAX) powder USE 1 CAPFUL DAILY AS NEEDED FOR CONSTIPATION Patient taking differently: Take by mouth daily. USE 1 CAPFUL DAILY AS NEEDED FOR CONSTIPATION 11/08/16  Yes Annitta Needs, NP    Allergies as of 03/09/2017 - Review Complete 03/09/2017  Allergen Reaction Noted  . Chantix [varenicline]  02/23/2014  . Tomato Rash 06/16/2011    Family History  Problem Relation Age of Onset  . Heart attack Mother        deceased, age 63s  . Ulcers Brother   . Diabetes Brother   . Hypertension Unknown   . Colon cancer Neg Hx     Social History   Social History  . Marital status: Married    Spouse name: N/A  . Number of children: 3  . Years of education: N/A   Occupational History  . disability    Social History Main Topics  . Smoking status: Current Every Day Smoker    Packs/day: 1.00    Years: 50.00    Types: Cigarettes  . Smokeless tobacco: Never Used     Comment: 1 pack per day as of 08/2012  . Alcohol use No     Comment: occasionally, usually holidays but sometimes on weekends, prior heavy use  . Drug use: No  . Sexual activity: Yes   Other Topics Concern  . Not on file   Social History Narrative  . No narrative on file    Review of Systems: See HPI, otherwise negative ROS  Physical Exam: BP (!) 173/98   Pulse 65   Temp 97 F (36.1 C)  (Oral)   Ht 5\' 6"  (1.676 m)   Wt 207 lb (93.9 kg)   BMI 33.41 kg/m  General:   Alert,   pleasant and cooperative in NAD Mouth:  No deformity or lesions. Neck:  Supple; no masses or thyromegaly. No significant cervical adenopathy. Lungs:  Clear throughout to auscultation.   No wheezes, crackles, or rhonchi. No acute distress. Heart:  Regular rate and rhythm; no murmurs, clicks, rubs,  or gallops. Abdomen: Non-distended, normal bowel sounds.  Soft and nontender without appreciable mass or hepatosplenomegaly.  Pulses:  Normal pulses noted. Extremities:  Without clubbing or edema.  Impression:  Pleasant 66 year old gentleman with long-standing GERD fairly well controlled on Protonix. Recurrent esophageal dysphagia to solids. Has vague upper abdominal pain for which he's had for years. It may be  insidiously worsened recently. CT 2016 was negative. EGD and colonoscopy as outlined above. Does have an abdominal wall component. Would be atypical for gallbladder disease. Long-standing daily NSAID use remains a possible contributing factor.  Chronic constipation may be contributing to some of his symptoms.  Her current dysphagia warrants further evaluation.  MiraLAX doesn't do the job as it did previously.  Recommendations:  Schedule EGD with possible ED; Dysphagia and upper abdominal pain - conscious sedation.  The risks, benefits, limitations, alternatives and imponderables have been reviewed with the patient. Potential for esophageal dilation, biopsy, etc. have also been reviewed.  Questions have been answered. All parties agreeable.  Continue Protonix 40 mg daily  Trial of [name] 145 daily samples provided  Further recommendations to follow               Notice: This dictation was prepared with Dragon dictation along with smaller phrase technology. Any transcriptional errors that result from this process are unintentional and may not be corrected upon review.

## 2017-04-18 ENCOUNTER — Encounter (HOSPITAL_COMMUNITY): Payer: Self-pay | Admitting: *Deleted

## 2017-04-19 ENCOUNTER — Ambulatory Visit (HOSPITAL_COMMUNITY)
Admission: RE | Admit: 2017-04-19 | Discharge: 2017-04-19 | Disposition: A | Discharge status: Home / Self Care | Payer: Medicare HMO | Source: Ambulatory Visit | Attending: Internal Medicine | Admitting: Internal Medicine

## 2017-04-19 ENCOUNTER — Encounter (HOSPITAL_COMMUNITY)
Admission: RE | Disposition: A | Discharge status: Home / Self Care | Payer: Self-pay | Source: Ambulatory Visit | Attending: Internal Medicine

## 2017-04-19 ENCOUNTER — Encounter (HOSPITAL_COMMUNITY): Payer: Self-pay | Admitting: *Deleted

## 2017-04-19 DIAGNOSIS — J449 Chronic obstructive pulmonary disease, unspecified: Secondary | ICD-10-CM | POA: Insufficient documentation

## 2017-04-19 DIAGNOSIS — Z888 Allergy status to other drugs, medicaments and biological substances status: Secondary | ICD-10-CM | POA: Insufficient documentation

## 2017-04-19 DIAGNOSIS — Z9889 Other specified postprocedural states: Secondary | ICD-10-CM | POA: Insufficient documentation

## 2017-04-19 DIAGNOSIS — Z7982 Long term (current) use of aspirin: Secondary | ICD-10-CM | POA: Diagnosis not present

## 2017-04-19 DIAGNOSIS — K219 Gastro-esophageal reflux disease without esophagitis: Secondary | ICD-10-CM | POA: Diagnosis not present

## 2017-04-19 DIAGNOSIS — I10 Essential (primary) hypertension: Secondary | ICD-10-CM | POA: Insufficient documentation

## 2017-04-19 DIAGNOSIS — E785 Hyperlipidemia, unspecified: Secondary | ICD-10-CM | POA: Diagnosis not present

## 2017-04-19 DIAGNOSIS — F419 Anxiety disorder, unspecified: Secondary | ICD-10-CM | POA: Diagnosis not present

## 2017-04-19 DIAGNOSIS — Z91018 Allergy to other foods: Secondary | ICD-10-CM | POA: Insufficient documentation

## 2017-04-19 DIAGNOSIS — F17210 Nicotine dependence, cigarettes, uncomplicated: Secondary | ICD-10-CM | POA: Diagnosis not present

## 2017-04-19 DIAGNOSIS — R1310 Dysphagia, unspecified: Secondary | ICD-10-CM

## 2017-04-19 DIAGNOSIS — Z833 Family history of diabetes mellitus: Secondary | ICD-10-CM | POA: Insufficient documentation

## 2017-04-19 DIAGNOSIS — K2950 Unspecified chronic gastritis without bleeding: Secondary | ICD-10-CM | POA: Diagnosis not present

## 2017-04-19 DIAGNOSIS — I2510 Atherosclerotic heart disease of native coronary artery without angina pectoris: Secondary | ICD-10-CM | POA: Diagnosis not present

## 2017-04-19 DIAGNOSIS — K319 Disease of stomach and duodenum, unspecified: Secondary | ICD-10-CM | POA: Diagnosis not present

## 2017-04-19 DIAGNOSIS — R1011 Right upper quadrant pain: Secondary | ICD-10-CM

## 2017-04-19 DIAGNOSIS — Z79899 Other long term (current) drug therapy: Secondary | ICD-10-CM | POA: Insufficient documentation

## 2017-04-19 DIAGNOSIS — K3189 Other diseases of stomach and duodenum: Secondary | ICD-10-CM

## 2017-04-19 DIAGNOSIS — Z8249 Family history of ischemic heart disease and other diseases of the circulatory system: Secondary | ICD-10-CM | POA: Insufficient documentation

## 2017-04-19 DIAGNOSIS — K5900 Constipation, unspecified: Secondary | ICD-10-CM | POA: Insufficient documentation

## 2017-04-19 DIAGNOSIS — Z8619 Personal history of other infectious and parasitic diseases: Secondary | ICD-10-CM | POA: Insufficient documentation

## 2017-04-19 DIAGNOSIS — G8929 Other chronic pain: Secondary | ICD-10-CM

## 2017-04-19 DIAGNOSIS — Z7951 Long term (current) use of inhaled steroids: Secondary | ICD-10-CM | POA: Insufficient documentation

## 2017-04-19 HISTORY — PX: ESOPHAGOGASTRODUODENOSCOPY: SHX5428

## 2017-04-19 HISTORY — PX: MALONEY DILATION: SHX5535

## 2017-04-19 SURGERY — EGD (ESOPHAGOGASTRODUODENOSCOPY)
Anesthesia: Moderate Sedation

## 2017-04-19 MED ORDER — LIDOCAINE VISCOUS 2 % MT SOLN
OROMUCOSAL | Status: AC
  Filled 2017-04-19: qty 15

## 2017-04-19 MED ORDER — MIDAZOLAM HCL 5 MG/5ML IJ SOLN
INTRAMUSCULAR | Status: AC
  Filled 2017-04-19: qty 10

## 2017-04-19 MED ORDER — ONDANSETRON HCL 4 MG/2ML IJ SOLN
INTRAMUSCULAR | Status: DC | PRN
  Administered 2017-04-19: 4 mg via INTRAVENOUS

## 2017-04-19 MED ORDER — ONDANSETRON HCL 4 MG/2ML IJ SOLN
INTRAMUSCULAR | Status: AC
  Filled 2017-04-19: qty 2

## 2017-04-19 MED ORDER — LIDOCAINE VISCOUS 2 % MT SOLN
OROMUCOSAL | Status: DC | PRN
  Administered 2017-04-19: 1 via OROMUCOSAL

## 2017-04-19 MED ORDER — MIDAZOLAM HCL 5 MG/5ML IJ SOLN
INTRAMUSCULAR | Status: DC | PRN
  Administered 2017-04-19: 1 mg via INTRAVENOUS
  Administered 2017-04-19 (×2): 2 mg via INTRAVENOUS

## 2017-04-19 MED ORDER — MEPERIDINE HCL 100 MG/ML IJ SOLN
INTRAMUSCULAR | Status: AC
  Filled 2017-04-19: qty 2

## 2017-04-19 MED ORDER — SODIUM CHLORIDE 0.9 % IV SOLN
INTRAVENOUS | Status: DC
  Administered 2017-04-19: 07:00:00 via INTRAVENOUS

## 2017-04-19 MED ORDER — MEPERIDINE HCL 100 MG/ML IJ SOLN
INTRAMUSCULAR | Status: DC | PRN
  Administered 2017-04-19 (×2): 50 mg via INTRAVENOUS

## 2017-04-19 NOTE — Op Note (Signed)
[facility] Patient Name: Trevor Berg Procedure Date: 04/19/2017 7:24 AM MRN: [id number] Date of Birth: 27-Feb-1951 Attending MD: [name] , MD CSN: [id number] Age: 66 Admit Type: Outpatient Procedure:                Upper GI endoscopy Indications:              Dysphagia Providers:                [name], MD, Janeece Riggers, RN, Randa Spike, Technician Referring MD:              Medicines:                Midazolam 5 mg IV, Meperidine 100 mg IV,                            Ondansetron 4 mg IV Complications:            No immediate complications. Estimated Blood Loss:     Estimated blood loss was minimal. Procedure:                Pre-Anesthesia Assessment:                           - Prior to the procedure, a History and Physical                            was performed, and patient medications and                            allergies were reviewed. The patient's tolerance of                            previous anesthesia was also reviewed. The risks                            and benefits of the procedure and the sedation                            options and risks were discussed with the patient.                            All questions were answered, and informed consent                            was obtained. Prior Anticoagulants: The patient has                            taken no previous anticoagulant or antiplatelet                            agents. ASA Grade Assessment: II - A patient with  mild systemic disease. After reviewing the risks                            and benefits, the patient was deemed in                            satisfactory condition to undergo the procedure.                           After obtaining informed consent, the endoscope was                            passed under direct vision. Throughout the                            procedure, the patient's blood pressure,  pulse, and                            oxygen saturations were monitored continuously. The                            [id number]) scope was introduced through the                            mouth, and advanced to the second part of duodenum.                            The upper GI endoscopy was accomplished without                            difficulty. The patient tolerated the procedure                            well. Scope In: 7:48:10 AM Scope Out: 4:03:47 AM Total Procedure Duration: 0 hours 8 minutes 7 seconds  Findings:      The examined esophagus was normal. . Estimated blood loss: none.      Multiple erosions were found in the gastric antrum. The duodenal bulb       and second portion of the duodenum were normal The scope was withdrawn.       Dilation was performed with a Maloney dilator with mild resistance at 56       Fr. The dilation site was . Estimated blood loss: none.      Abnormal gastric mucosa was biopsied with a cold forceps for histology.       Estimated blood loss was minimal. Impression:               - Normal esophagus. Dilated.                           - Erosive gastropathy. Biopsied.                           - Normal duodenal bulb and second portion of the  duodenum. Moderate Sedation:      Moderate (conscious) sedation was administered by the endoscopy nurse       and supervised by the endoscopist. The following parameters were       monitored: oxygen saturation, heart rate, blood pressure, respiratory       rate, EKG, adequacy of pulmonary ventilation, and response to care.       Total physician intraservice time was 17 minutes. Recommendation:           - Patient has a contact number available for                            emergencies. The signs and symptoms of potential                            delayed complications were discussed with the                            patient. Return to normal activities tomorrow.                             Written discharge instructions were provided to the                            patient.                           - Resume previous diet.                           - Continue present medications. Increase [name] to                            290 daily. Continue Dexilant 60 mg daily.                           - Await pathology results.                           - No repeat upper endoscopy.                           - Return to GI office in 6 weeks. Procedure Code(s):        --- Professional ---                           [id number], Esophagogastroduodenoscopy, flexible,                            transoral; with biopsy, single or multiple                           43450, Dilation of esophagus, by unguided sound or                            bougie, single or multiple passes  55732, Moderate sedation services provided by the                            same physician or other qualified health care                            professional performing the diagnostic or                            therapeutic service that the sedation supports,                            requiring the presence of an independent trained                            observer to assist in the monitoring of the                            patient's level of consciousness and physiological                            status; initial 15 minutes of intraservice time,                            patient age 27 years or older Diagnosis Code(s):        --- Professional ---                           K31.89, Other diseases of stomach and duodenum                           R13.10, Dysphagia, unspecified CPT copyright 2016 American Medical Association. All rights reserved. The codes documented in this report are preliminary and upon coder review may  be revised to meet current compliance requirements. [name]. Brisha Mccabe, MD [name], MD 04/19/2017 8:17:07 AM This report has been  signed electronically. Number of Addenda: 0

## 2017-04-19 NOTE — Discharge Instructions (Addendum)
EGD Discharge instructions Please read the instructions outlined below and refer to this sheet in the next few weeks. These discharge instructions provide you with general information on caring for yourself after you leave the hospital. Your doctor may also give you specific instructions. While your treatment has been planned according to the most current medical practices available, unavoidable complications occasionally occur. If you have any problems or questions after discharge, please call your doctor. ACTIVITY  You may resume your regular activity but move at a slower pace for the next 24 hours.   Take frequent rest periods for the next 24 hours.   Walking will help expel (get rid of) the air and reduce the bloated feeling in your abdomen.   No driving for 24 hours (because of the anesthesia (medicine) used during the test).   You may shower.   Do not sign any important legal documents or operate any machinery for 24 hours (because of the anesthesia used during the test).  NUTRITION  Drink plenty of fluids.   You may resume your normal diet.   Begin with a light meal and progress to your normal diet.   Avoid alcoholic beverages for 24 hours or as instructed by your caregiver.  MEDICATIONS  You may resume your normal medications unless your caregiver tells you otherwise.  WHAT YOU CAN EXPECT TODAY  You may experience abdominal discomfort such as a feeling of fullness or gas pains.  FOLLOW-UP  Your doctor will discuss the results of your test with you.  SEEK IMMEDIATE MEDICAL ATTENTION IF ANY OF THE FOLLOWING OCCUR:  Excessive nausea (feeling sick to your stomach) and/or vomiting.   Severe abdominal pain and distention (swelling).   Trouble swallowing.   Temperature over 101 F (37.8 C).   Rectal bleeding or vomiting of blood.    GERD information provided  Continue Dexilant 60 mg daily  Increase linzess to 290 g daily-go by the office for free samples and  prescription provided  Office visit with [place] in 4-6 weeks  Further recommendations to follow pending review of pathology report

## 2017-04-19 NOTE — H&P (Signed)
@LOGO @   Primary Care Physician:  Jani Gravel, MD Primary Gastroenterologist:  Dr. Gala Romney  Pre-Procedure History & Physical: HPI:  Trevor Berg is a 66 y.o. male here for further evaluation of dysphagia. EGD. Some nonspecific abdominal pain slightly less than would [name] 145. [name] has helped bowel function. Feels he needs a higher dose, however.  Past Medical History:  Diagnosis Date  . Anginal pain ([place])   . Anxiety   . Asthma   . Chest pain 2005   noncritical coronary disease in 2005: 40% distal RCA, 30% CX, 50% OM 2, EF-60%,  . COPD (chronic obstructive pulmonary disease) (HCC)    Chronic bronchitis; 100-pack-year cigarette consumption  . Gastroesophageal reflux disease    possible small Schatzki's ring; esophageal dilatation in 2005  . H. pylori infection 2014   treated with prevpac  . Hyperlipidemia   . Hypertension   . Overweight(278.02)   . Tobacco abuse    100 pack years    Past Surgical History:  Procedure Laterality Date  . CERVICAL SPINE SURGERY     C5-6 and C6-7: Relief of spinal stenosis with corpectomy, foraminotomy and fusion  . COLONOSCOPY  10/19/2004   RMR:   Friable anal canal, likely source of patient's hematochezia/  Otherwise normal rectum, normal colon.  . COLONOSCOPY N/A 09/09/2015   Procedure: COLONOSCOPY;  Surgeon: Daneil Dolin, MD;  [facility];  Service: Endoscopy;  Laterality: N/A;  1015  . ESOPHAGEAL DILATION N/A 09/09/2015   Procedure: ESOPHAGEAL DILATION;  Surgeon: Daneil Dolin, MD;  [facility];  Service: Endoscopy;  Laterality: N/A;  . ESOPHAGOGASTRODUODENOSCOPY  09/27/2004   RMR:   A couple of tiny, distal esophageal erosions, otherwise normal esophagus aside from a Schatzki ring status post Maloney dilation as described above/ Small hiatal hernia, otherwise normal stomach, normal D1-D2  . ESOPHAGOGASTRODUODENOSCOPY N/A 09/09/2015   Procedure: ESOPHAGOGASTRODUODENOSCOPY (EGD);  Surgeon: Daneil Dolin, MD;   [facility];  Service: Endoscopy;  Laterality: N/A;  . ESOPHAGOGASTRODUODENOSCOPY (EGD) WITH ESOPHAGEAL DILATION N/A 03/19/2013   FAO:ZHYQMV esophagus s/p dilation/Abnormal stomach of uncertain clinical significance-s/p bx positive for H. pylori, status post Prevpac treatment  . ORIF ANKLE FRACTURE     right  . ORIF TIBIA FRACTURE     Left  . TONSILLECTOMY      Prior to Admission medications   Medication Sig Start Date End Date Taking? Authorizing Provider  albuterol (PROVENTIL HFA;VENTOLIN HFA) 108 (90 BASE) MCG/ACT inhaler Inhale 2 puffs into the lungs every 6 (six) hours as needed for shortness of breath.    Yes [provider]  amLODipine (NORVASC) 2.5 MG tablet Take 2.5 mg by mouth daily.   Yes [provider]  ANORO ELLIPTA 62.5-25 MCG/INH AEPB Inhale 1 puff into the lungs daily.  03/08/15  Yes [provider]  aspirin EC 81 MG tablet Take 81 mg by mouth daily.   Yes [provider]  atenolol (TENORMIN) 50 MG tablet TAKE ONE TABLET BY MOUTH ONCE DAILY IN THE MORNING 07/27/16  Yes Branch, Alphonse Guild, MD  DEXILANT 60 MG capsule Take 60 mg by mouth daily.  01/28/14  Yes [provider]  dextromethorphan-guaiFENesin (MUCINEX DM) 30-600 MG 12hr tablet Take 1 tablet by mouth every 12 (twelve) hours.    Yes [provider]  ibuprofen (ADVIL,MOTRIN) 800 MG tablet Take 800 mg by mouth 3 (three) times daily as needed for moderate pain.  04/26/15  Yes [provider]  lisinopril (  PRINIVIL,ZESTRIL) 40 MG tablet Take 1 tablet (40 mg total) by mouth daily. 11/06/16  Yes Branch, Alphonse Guild, MD  LORazepam (ATIVAN) 1 MG tablet Take 1 mg by mouth 3 (three) times daily as needed for anxiety.    Yes [provider]  Naphazoline HCl (CLEAR EYES OP) Apply 1 drop to eye daily as needed (dry eyes).   Yes [provider]  nitroGLYCERIN (NITROSTAT) 0.4 MG SL tablet Place 0.4 mg under the tongue every 5 (five) minutes as needed for  chest pain. Chest Pain   Yes [provider]  pantoprazole (PROTONIX) 40 MG tablet Take 40 mg by mouth daily.   Yes [provider]  Phenylephrine-APAP-Guaifenesin (MUCINEX SINUS-MAX PO) Take 2 tablets by mouth daily as needed (sinus).   Yes [provider]  polyethylene glycol powder (GLYCOLAX/MIRALAX) powder USE 1 CAPFUL DAILY AS NEEDED FOR CONSTIPATION Patient taking differently: USE 1 CAPFUL DAILY 11/08/16  Yes Annitta Needs, NP  sertraline (ZOLOFT) 50 MG tablet Take 50 mg by mouth daily.   Yes [provider]  simvastatin (ZOCOR) 20 MG tablet Take 20 mg by mouth at bedtime.   Yes [provider]  traZODone (DESYREL) 50 MG tablet Take 50 mg by mouth at bedtime. 04/09/17  Yes [provider]  vitamin B-12 (CYANOCOBALAMIN) 1000 MCG tablet Take 2,000 mcg by mouth 2 (two) times daily.   Yes [provider]    Allergies as of 03/09/2017 - Review Complete 03/09/2017  Allergen Reaction Noted  . Chantix [varenicline]  02/23/2014  . Tomato Rash 06/16/2011    Family History  Problem Relation Age of Onset  . Heart attack Mother        deceased, age 30s  . Ulcers Brother   . Diabetes Brother   . Hypertension Unknown   . Colon cancer Neg Hx     Social History   Social History  . Marital status: Married    Spouse name: N/A  . Number of children: 3  . Years of education: N/A   Occupational History  . disability    Social History Main Topics  . Smoking status: Current Every Day Smoker    Packs/day: 1.00    Years: 50.00    Types: Cigarettes  . Smokeless tobacco: Never Used     Comment: 1 pack per day as of 08/2012  . Alcohol use No     Comment: occasionally, usually holidays but sometimes on weekends, prior heavy use  . Drug use: No  . Sexual activity: Yes   Other Topics Concern  . Not on file   Social History Narrative  . No narrative on file    Review of Systems: See HPI, otherwise negative ROS  Physical  Exam: BP (!) 153/79   Pulse 63   Temp 97.6 F (36.4 C) (Oral)   Resp 20   Ht 5\' 6"  (1.676 m)   Wt 204 lb (92.5 kg)   SpO2 97%   BMI 32.93 kg/m  General:   Alert,  Well-developed, well-nourished, pleasant and cooperative in NAD Skin:  Intact without significant lesions or rashes. Neck:  Supple; no masses or thyromegaly. No significant cervical adenopathy. Lungs:  Clear throughout to auscultation.   No wheezes, crackles, or rhonchi. No acute distress. Heart:  Regular rate and rhythm; no murmurs, clicks, rubs,  or gallops. Abdomen: Non-distended, normal bowel sounds.  Soft and nontender without appreciable mass or hepatosplenomegaly.  Pulses:  Normal pulses noted. Extremities:  Without clubbing or edema.  Impression:  66 year old gentleman esophageal dysphagia. Previously responded to empiric dilation without structural abnormality found. [name] has  helped constipation. Abdominal pain persisting. May benefit from a higher dose of [name].  Recommendations: I have offered the patient the EGD with ED feasible/appropriate delay. The risks, benefits, limitations, alternatives and imponderables have been reviewed with the patient. Potential for esophageal dilation, biopsy, etc. have also been reviewed.  Questions have been answered. All parties agreeable.     Notice: This dictation was prepared with Dragon dictation along with smaller phrase technology. Any transcriptional errors that result from this process are unintentional and may not be corrected upon review.

## 2017-04-23 ENCOUNTER — Encounter: Payer: Self-pay | Admitting: Internal Medicine

## 2017-04-30 ENCOUNTER — Encounter (HOSPITAL_COMMUNITY): Payer: Self-pay | Admitting: Internal Medicine

## 2017-06-18 ENCOUNTER — Encounter: Payer: Self-pay | Admitting: Internal Medicine

## 2017-07-24 ENCOUNTER — Emergency Department (HOSPITAL_COMMUNITY)
Admission: EM | Admit: 2017-07-24 | Discharge: 2017-07-24 | Disposition: A | Discharge status: Home / Self Care | Payer: Medicare HMO | Attending: Emergency Medicine | Admitting: Emergency Medicine

## 2017-07-24 ENCOUNTER — Ambulatory Visit: Payer: Medicare HMO | Admitting: Internal Medicine

## 2017-07-24 ENCOUNTER — Other Ambulatory Visit: Payer: Self-pay

## 2017-07-24 ENCOUNTER — Emergency Department (HOSPITAL_COMMUNITY): Payer: Medicare HMO

## 2017-07-24 ENCOUNTER — Encounter (HOSPITAL_COMMUNITY): Payer: Self-pay | Admitting: Emergency Medicine

## 2017-07-24 DIAGNOSIS — Z79899 Other long term (current) drug therapy: Secondary | ICD-10-CM | POA: Diagnosis not present

## 2017-07-24 DIAGNOSIS — J45909 Unspecified asthma, uncomplicated: Secondary | ICD-10-CM | POA: Insufficient documentation

## 2017-07-24 DIAGNOSIS — Z7982 Long term (current) use of aspirin: Secondary | ICD-10-CM | POA: Diagnosis not present

## 2017-07-24 DIAGNOSIS — R079 Chest pain, unspecified: Secondary | ICD-10-CM | POA: Insufficient documentation

## 2017-07-24 DIAGNOSIS — I10 Essential (primary) hypertension: Secondary | ICD-10-CM | POA: Diagnosis not present

## 2017-07-24 DIAGNOSIS — J449 Chronic obstructive pulmonary disease, unspecified: Secondary | ICD-10-CM | POA: Diagnosis not present

## 2017-07-24 DIAGNOSIS — F17210 Nicotine dependence, cigarettes, uncomplicated: Secondary | ICD-10-CM | POA: Insufficient documentation

## 2017-07-24 LAB — BASIC METABOLIC PANEL
Anion gap: 10 (ref 5–15)
BUN: 23 mg/dL — ABNORMAL HIGH (ref 6–20)
CO2: 27 mmol/L (ref 22–32)
Calcium: 9.6 mg/dL (ref 8.9–10.3)
Chloride: 107 mmol/L (ref 101–111)
Creatinine, Ser: 1.25 mg/dL — ABNORMAL HIGH (ref 0.61–1.24)
GFR calc Af Amer: >60 mL/min (ref >60)
GFR calc non Af Amer: 59 mL/min — ABNORMAL LOW (ref >60)
Glucose, Bld: 90 mg/dL (ref 65–99)
Potassium: 4.8 mmol/L (ref 3.5–5.1)
Sodium: 144 mmol/L (ref 135–145)

## 2017-07-24 LAB — CBC
HCT: 40.6 % (ref 39.0–52.0)
Hemoglobin: 13.8 g/dL (ref 13.0–17.0)
MCH: 32.2 pg (ref 26.0–34.0)
MCHC: 34 g/dL (ref 30.0–36.0)
MCV: 94.9 fL (ref 78.0–100.0)
Platelets: 212 10*3/uL (ref 150–400)
RBC: 4.28 MIL/uL (ref 4.22–5.81)
RDW: 13.6 % (ref 11.5–15.5)
WBC: 8.3 10*3/uL (ref 4.0–10.5)

## 2017-07-24 LAB — TROPONIN I: Troponin I: <0.03 ng/mL (ref <0.03)

## 2017-07-24 MED ORDER — IBUPROFEN 400 MG PO TABS
400.0000 mg | ORAL_TABLET | Freq: Once | ORAL | Status: AC
  Administered 2017-07-24: 400 mg via ORAL
  Filled 2017-07-24: qty 1

## 2017-07-24 MED ORDER — LORAZEPAM 0.5 MG PO TABS
0.5000 mg | ORAL_TABLET | Freq: Once | ORAL | Status: AC
Start: 2017-07-24 — End: 2017-07-24
  Administered 2017-07-24: 0.5 mg via ORAL
  Filled 2017-07-24: qty 1

## 2017-07-24 MED ORDER — OXYCODONE-ACETAMINOPHEN 5-325 MG PO TABS
1.0000 | ORAL_TABLET | Freq: Once | ORAL | Status: AC
  Administered 2017-07-24: 1 via ORAL
  Filled 2017-07-24: qty 1

## 2017-07-24 NOTE — ED Triage Notes (Signed)
Pt c/o central chest pain for a few days. He states he has taken pain pills and nitro with no relief.

## 2017-07-24 NOTE — ED Notes (Signed)
Gave EKG to Dr.Kohut

## 2017-07-24 NOTE — ED Provider Notes (Signed)
[facility] DEPT Provider Note   CSN: [id number] Arrival date & time: 07/24/17  2043     History   Chief Complaint Chief Complaint  Patient presents with  . Chest Pain    HPI Trevor Berg is a 66 y.o. male.  HPI   66 year old male with chest pain. Onset about a week ago. Pain is substernal and constant. Says it "just hurts." No appreciable exacerbating/relieving factors. Denies any associated symptoms such as dyspnea, palpitations diaphoresis or nausea. No unusual leg pain or swelling. He has tried taking nitroglycerin without improvement. Previous cardiac catheterization with noncritical coronary artery disease although this was 13 years ago.  Past Medical History:  Diagnosis Date  . Anginal pain ([place])   . Anxiety   . Asthma   . Chest pain 2005   noncritical coronary disease in 2005: 40% distal RCA, 30% CX, 50% OM 2, EF-60%,  . COPD (chronic obstructive pulmonary disease) (HCC)    Chronic bronchitis; 100-pack-year cigarette consumption  . Gastroesophageal reflux disease    possible small Schatzki's ring; esophageal dilatation in 2005  . H. pylori infection 2014   treated with prevpac  . Hyperlipidemia   . Hypertension   . Overweight(278.02)   . Tobacco abuse    100 pack years    Patient Active Problem List   Diagnosis Date Noted  . Mucosal abnormality of stomach   . Dysphagia   . History of colonic polyps   . Diverticulosis of colon without hemorrhage   . Essential hypertension 02/23/2014  . COPD (chronic obstructive pulmonary disease) ([place]) 11/07/2013  . COPD exacerbation ([place]) 11/06/2013  . Pneumonia and influenza 11/06/2013  . Cough with sputum 05/07/2013  . Esophageal dysphagia 02/25/2013  . Chronic constipation 02/25/2013  . Abdominal pain, chronic, epigastric 02/25/2013  . Chest pain   . Tobacco abuse     Past Surgical History:  Procedure Laterality Date  . CERVICAL SPINE SURGERY     C5-6 and C6-7: Relief of spinal stenosis with corpectomy,  foraminotomy and fusion  . COLONOSCOPY  10/19/2004   RMR:   Friable anal canal, likely source of patient's hematochezia/  Otherwise normal rectum, normal colon.  . COLONOSCOPY N/A 09/09/2015   Procedure: COLONOSCOPY;  Surgeon: Daneil Dolin, MD;  [facility];  Service: Endoscopy;  Laterality: N/A;  1015  . ESOPHAGEAL DILATION N/A 09/09/2015   Procedure: ESOPHAGEAL DILATION;  Surgeon: Daneil Dolin, MD;  Location: [facility];  Service: Endoscopy;  Laterality: N/A;  . ESOPHAGOGASTRODUODENOSCOPY  09/27/2004   RMR:   A couple of tiny, distal esophageal erosions, otherwise normal esophagus aside from a Schatzki ring status post Maloney dilation as described above/ Small hiatal hernia, otherwise normal stomach, normal D1-D2  . ESOPHAGOGASTRODUODENOSCOPY N/A 09/09/2015   Procedure: ESOPHAGOGASTRODUODENOSCOPY (EGD);  Surgeon: Daneil Dolin, MD;  [facility];  Service: Endoscopy;  Laterality: N/A;  . ESOPHAGOGASTRODUODENOSCOPY N/A 04/19/2017   Procedure: ESOPHAGOGASTRODUODENOSCOPY (EGD);  Surgeon: Daneil Dolin, MD;  [facility];  Service: Endoscopy;  Laterality: N/A;  730   . ESOPHAGOGASTRODUODENOSCOPY (EGD) WITH ESOPHAGEAL DILATION N/A 03/19/2013   OEU:MPNTIR esophagus s/p dilation/Abnormal stomach of uncertain clinical significance-s/p bx positive for H. pylori, status post Prevpac treatment  . MALONEY DILATION N/A 04/19/2017   Procedure: Venia Minks DILATION;  Surgeon: Daneil Dolin, MD;  [facility];  Service: Endoscopy;  Laterality: N/A;  . ORIF ANKLE FRACTURE     right  . ORIF TIBIA FRACTURE  Left  . TONSILLECTOMY         Home Medications    Prior to Admission medications   Medication Sig Start Date End Date Taking? Authorizing Provider  albuterol (PROVENTIL HFA;VENTOLIN HFA) 108 (90 BASE) MCG/ACT inhaler Inhale 2 puffs into the lungs every 6 (six) hours as needed for shortness of breath.    Yes [provider]  amLODipine  (NORVASC) 2.5 MG tablet Take 2.5 mg by mouth daily.   Yes [provider]  ANORO ELLIPTA 62.5-25 MCG/INH AEPB Inhale 1 puff into the lungs daily.  03/08/15  Yes [provider]  aspirin EC 81 MG tablet Take 81 mg by mouth daily.   Yes [provider]  atenolol (TENORMIN) 50 MG tablet TAKE ONE TABLET BY MOUTH ONCE DAILY IN THE MORNING 07/27/16  Yes Branch, Alphonse Guild, MD  atorvastatin (LIPITOR) 20 MG tablet Take 20 mg by mouth every morning.   Yes [provider]  DEXILANT 60 MG capsule Take 60 mg by mouth daily.  01/28/14  Yes [provider]  dextromethorphan-guaiFENesin (MUCINEX DM) 30-600 MG 12hr tablet Take 1 tablet by mouth every 12 (twelve) hours.    Yes [provider]  hydrochlorothiazide (MICROZIDE) 12.5 MG capsule TAKE 1 CAPSULE BY MOUTH ONCE A DAY FOR HTN/SWELLING 06/29/17  Yes [provider]  ibuprofen (ADVIL,MOTRIN) 800 MG tablet Take 800 mg by mouth 3 (three) times daily as needed for moderate pain.  04/26/15  Yes [provider]  [name] 290 MCG CAPS capsule Take 290 mcg by mouth daily.  06/27/17  Yes [provider]  lisinopril (PRINIVIL,ZESTRIL) 40 MG tablet Take 1 tablet (40 mg total) by mouth daily. 11/06/16  Yes Branch, Alphonse Guild, MD  LORazepam (ATIVAN) 1 MG tablet Take 1 mg by mouth 3 (three) times daily as needed for anxiety.    Yes [provider]  Naphazoline HCl (CLEAR EYES OP) Apply 1 drop to eye daily as needed (dry eyes).   Yes [provider]  nitroGLYCERIN (NITROSTAT) 0.4 MG SL tablet Place 0.4 mg under the tongue every 5 (five) minutes as needed for chest pain. Chest Pain   Yes [provider]  pantoprazole (PROTONIX) 40 MG tablet Take 40 mg by mouth daily.   Yes [provider]  polyethylene glycol powder (GLYCOLAX/MIRALAX) powder USE 1 CAPFUL DAILY AS NEEDED FOR CONSTIPATION Patient taking differently: USE 1 CAPFUL DAILY 11/08/16  Yes Annitta Needs, NP    sertraline (ZOLOFT) 50 MG tablet Take 50 mg by mouth daily.   Yes [provider]  simvastatin (ZOCOR) 20 MG tablet Take 20 mg by mouth at bedtime.   Yes [provider]  traZODone (DESYREL) 50 MG tablet Take 50 mg by mouth at bedtime. 04/09/17  Yes [provider]  vitamin B-12 (CYANOCOBALAMIN) 1000 MCG tablet Take 2,000 mcg by mouth 2 (two) times daily.   Yes [provider]    Family History Family History  Problem Relation Age of Onset  . Heart attack Mother        deceased, age 22s  . Ulcers Brother   . Diabetes Brother   . Hypertension Unknown   . Colon cancer Neg Hx     Social History Social History  Substance Use Topics  . Smoking status: Current Every Day Smoker    Packs/day: 1.00    Years: 50.00    Types: Cigarettes  . Smokeless tobacco: Never Used     Comment: 1 pack per day as  of 08/2012  . Alcohol use No     Comment: occasionally, usually holidays but sometimes on weekends, prior heavy use     Allergies   Chantix [varenicline] and Tomato   Review of Systems Review of Systems  All systems reviewed and negative, other than as noted in HPI.   Physical Exam Updated Vital Signs BP 126/76   Pulse 64   Temp 98.2 F (36.8 C)   Resp 15   Ht 5\' 6"  (1.676 m)   Wt 90.7 kg (200 lb)   SpO2 95%   BMI 32.28 kg/m   Physical Exam  Constitutional: He appears well-developed and well-nourished. No distress.  HENT:  Head: Normocephalic and atraumatic.  Eyes: Conjunctivae are normal. Right eye exhibits no discharge. Left eye exhibits no discharge.  Neck: Neck supple.  Cardiovascular: Normal rate, regular rhythm and normal heart sounds.  Exam reveals no gallop and no friction rub.   No murmur heard. Pulmonary/Chest: Effort normal. No respiratory distress. He has wheezes.  Faint wheezing bilaterally  Abdominal: Soft. He exhibits no distension. There is no tenderness.  Musculoskeletal: He exhibits no edema or tenderness.  Lower  extremities symmetric as compared to each other. No calf tenderness. Negative Homan's. No palpable cords.   Neurological: He is alert.  Skin: Skin is warm and dry.  Psychiatric: He has a normal mood and affect. His behavior is normal. Thought content normal.  Nursing note and vitals reviewed.    ED Treatments / Results  Labs (all labs ordered are listed, but only abnormal results are displayed) Labs Reviewed  BASIC METABOLIC PANEL - Abnormal; Notable for the following:       Result Value   BUN 23 (*)    Creatinine, Ser 1.25 (*)    GFR calc non Af Amer 59 (*)    All other components within normal limits  CBC  TROPONIN I    EKG  EKG Interpretation  Date/Time:  Tuesday July 24 2017 20:54:00 EDT Ventricular Rate:  74 PR Interval:  190 QRS Duration: 102 QT Interval:  384 QTC Calculation: 426 R Axis:   -15 Text Interpretation:  Normal sinus rhythm Normal ECG Confirmed by Virgel Manifold [phone]) on 07/24/2017 11:13:34 PM       Radiology Dg Chest 2 View  Result Date: 07/24/2017 CLINICAL DATA:  Chest pain for few days EXAM: CHEST  2 VIEW COMPARISON:  July 26, 2015 FINDINGS: The heart size and mediastinal contours are within normal limits. There is no focal infiltrate, pulmonary edema, or pleural effusion. The visualized skeletal structures are stable. IMPRESSION: No active cardiopulmonary disease. Electronically Signed   By: Abelardo Diesel M.D.   On: 07/24/2017 21:27    Procedures Procedures (including critical care time)  Medications Ordered in ED Medications  oxyCODONE-acetaminophen (PERCOCET/ROXICET) 5-325 MG per tablet 1 tablet (not administered)  ibuprofen (ADVIL,MOTRIN) tablet 400 mg (not administered)  LORazepam (ATIVAN) tablet 0.5 mg (not administered)     Initial Impression / Assessment and Plan / ED Course  I have reviewed the triage vital signs and the nursing notes.  Pertinent labs & imaging results that were available during my care of the patient  were reviewed by me and considered in my medical decision making (see chart for details).     66 year old male with chest pain. Seems atypical for ACS given the constant duration for about a week. Troponin is normal. He has a reassuring EKG. Chest x-ray without acute abnormality. I doubt PE, dissection or other emergent process. He  did have one low blood pressure recorded which I suspect was a spurious reading. All other blood pressures were normal.  Final Clinical Impressions(s) / ED Diagnoses   Final diagnoses:  Chest pain, unspecified type    New Prescriptions New Prescriptions   No medications on file     Virgel Manifold, MD 07/27/17 1453

## 2017-08-14 ENCOUNTER — Encounter: Payer: Self-pay | Admitting: *Deleted

## 2017-08-15 ENCOUNTER — Ambulatory Visit: Payer: Medicare HMO | Admitting: Cardiology

## 2017-08-15 ENCOUNTER — Ambulatory Visit: Payer: Self-pay | Admitting: Cardiology

## 2017-08-15 NOTE — Progress Notes (Deleted)
Clinical Summary [name] is a 66 y.o.male seen today for follow up of the following medical problems  1.CAD - long history of chest pain - cath in 2005 without mild to moderate non-obstructive disease - negative stress echo 09/2012, baseline LVEF 55-60% - following avisit with PA Lenze a Lexiscan was ordered due to chest pain. It showed an inferior defect due to subdiaphragmatic attenuation, no ischemia.  - of note he has history of GERD with esophageal dysphagia followed by GI. Has upcoming EGD.  - pain unchanged since last visit. Pain atypical in that it can last can several last up to 24 hrs constantly.   07/2015 nuclear stress: scar vs artifact, no significant ischemia, low risk.   - still with chest pains overall unchanged. Has upcoming appointment with GI. - stable SOB/DOE.   06/2017 ER visit with chest pain, symptoms constant x 1 week.   2. COPD - compliant with inhalers  3. HTN - compliant with meds   4. Hyperlipidemia  - upcoming labs with pcp - he is compliant with staitn  5. AAA screen - history of smoking, will require AAA [place] later this year at age 54   Past Medical History:  Diagnosis Date  . Anginal pain ([place])   . Anxiety   . Asthma   . Chest pain 2005   noncritical coronary disease in 2005: 40% distal RCA, 30% CX, 50% OM 2, EF-60%,  . COPD (chronic obstructive pulmonary disease) (HCC)    Chronic bronchitis; 100-pack-year cigarette consumption  . Gastroesophageal reflux disease    possible small Schatzki's ring; esophageal dilatation in 2005  . H. pylori infection 2014   treated with prevpac  . Hyperlipidemia   . Hypertension   . Overweight(278.02)   . Tobacco abuse    100 pack years     Allergies  Allergen Reactions  . Chantix [Varenicline]     Nightmares and personality changes.  . Tomato Rash     Current Outpatient Prescriptions  Medication Sig Dispense Refill  . albuterol (PROVENTIL HFA;VENTOLIN HFA) 108 (90 BASE)  MCG/ACT inhaler Inhale 2 puffs into the lungs every 6 (six) hours as needed for shortness of breath.     Marland Kitchen amLODipine (NORVASC) 2.5 MG tablet Take 2.5 mg by mouth daily.    Jearl Klinefelter ELLIPTA 62.5-25 MCG/INH AEPB Inhale 1 puff into the lungs daily.     Marland Kitchen aspirin EC 81 MG tablet Take 81 mg by mouth daily.    Marland Kitchen atenolol (TENORMIN) 50 MG tablet TAKE ONE TABLET BY MOUTH ONCE DAILY IN THE MORNING 30 tablet 3  . atorvastatin (LIPITOR) 20 MG tablet Take 20 mg by mouth every morning.    Marland Kitchen DEXILANT 60 MG capsule Take 60 mg by mouth daily.     Marland Kitchen dextromethorphan-guaiFENesin (MUCINEX DM) 30-600 MG 12hr tablet Take 1 tablet by mouth every 12 (twelve) hours.     . hydrochlorothiazide (MICROZIDE) 12.5 MG capsule TAKE 1 CAPSULE BY MOUTH ONCE A DAY FOR HTN/SWELLING  3  . ibuprofen (ADVIL,MOTRIN) 800 MG tablet Take 800 mg by mouth 3 (three) times daily as needed for moderate pain.     Marland Kitchen [name] 290 MCG CAPS capsule Take 290 mcg by mouth daily.   5  . lisinopril (PRINIVIL,ZESTRIL) 40 MG tablet Take 1 tablet (40 mg total) by mouth daily. 90 tablet 3  . LORazepam (ATIVAN) 1 MG tablet Take 1 mg by mouth 3 (three) times daily as needed for anxiety.     Marland Kitchen  Naphazoline HCl (CLEAR EYES OP) Apply 1 drop to eye daily as needed (dry eyes).    . nitroGLYCERIN (NITROSTAT) 0.4 MG SL tablet Place 0.4 mg under the tongue every 5 (five) minutes as needed for chest pain. Chest Pain    . pantoprazole (PROTONIX) 40 MG tablet Take 40 mg by mouth daily.    . polyethylene glycol powder (GLYCOLAX/MIRALAX) powder USE 1 CAPFUL DAILY AS NEEDED FOR CONSTIPATION (Patient taking differently: USE 1 CAPFUL DAILY) 527 g 11  . sertraline (ZOLOFT) 50 MG tablet Take 50 mg by mouth daily.    . simvastatin (ZOCOR) 20 MG tablet Take 20 mg by mouth at bedtime.    . traZODone (DESYREL) 50 MG tablet Take 50 mg by mouth at bedtime.  0  . vitamin B-12 (CYANOCOBALAMIN) 1000 MCG tablet Take 2,000 mcg by mouth 2 (two) times daily.     No current  facility-administered medications for this visit.      Past Surgical History:  Procedure Laterality Date  . CERVICAL SPINE SURGERY     C5-6 and C6-7: Relief of spinal stenosis with corpectomy, foraminotomy and fusion  . COLONOSCOPY  10/19/2004   RMR:   Friable anal canal, likely source of patient's hematochezia/  Otherwise normal rectum, normal colon.  . COLONOSCOPY N/A 09/09/2015   Procedure: COLONOSCOPY;  Surgeon: Daneil Dolin, MD;  [facility];  Service: Endoscopy;  Laterality: N/A;  1015  . ESOPHAGEAL DILATION N/A 09/09/2015   Procedure: ESOPHAGEAL DILATION;  Surgeon: Daneil Dolin, MD;  [facility];  Service: Endoscopy;  Laterality: N/A;  . ESOPHAGOGASTRODUODENOSCOPY  09/27/2004   RMR:   A couple of tiny, distal esophageal erosions, otherwise normal esophagus aside from a Schatzki ring status post Maloney dilation as described above/ Small hiatal hernia, otherwise normal stomach, normal D1-D2  . ESOPHAGOGASTRODUODENOSCOPY N/A 09/09/2015   Procedure: ESOPHAGOGASTRODUODENOSCOPY (EGD);  Surgeon: Daneil Dolin, MD;  [facility];  Service: Endoscopy;  Laterality: N/A;  . ESOPHAGOGASTRODUODENOSCOPY N/A 04/19/2017   Procedure: ESOPHAGOGASTRODUODENOSCOPY (EGD);  Surgeon: Daneil Dolin, MD;  [facility];  Service: Endoscopy;  Laterality: N/A;  730   . ESOPHAGOGASTRODUODENOSCOPY (EGD) WITH ESOPHAGEAL DILATION N/A 03/19/2013   QJJ:HERDEY esophagus s/p dilation/Abnormal stomach of uncertain clinical significance-s/p bx positive for H. pylori, status post Prevpac treatment  . MALONEY DILATION N/A 04/19/2017   Procedure: Venia Minks DILATION;  Surgeon: Daneil Dolin, MD;  [facility];  Service: Endoscopy;  Laterality: N/A;  . ORIF ANKLE FRACTURE     right  . ORIF TIBIA FRACTURE     Left  . TONSILLECTOMY       Allergies  Allergen Reactions  . Chantix [Varenicline]     Nightmares and personality changes.  . Tomato Rash       Family History  Problem Relation Age of Onset  . Heart attack Mother        deceased, age 31s  . Ulcers Brother   . Diabetes Brother   . Hypertension Unknown   . Colon cancer Neg Hx      Social History Mr. Atteberry reports that he has been smoking Cigarettes.  He has a 50.00 pack-year smoking history. He has never used smokeless tobacco. Mr. Ogas reports that he does not drink alcohol.   Review of Systems CONSTITUTIONAL: No weight loss, fever, chills, weakness or fatigue.  HEENT: Eyes: No visual loss, blurred vision, double vision or yellow sclerae.No hearing loss, sneezing, congestion, runny nose or sore  throat.  SKIN: No rash or itching.  CARDIOVASCULAR:  RESPIRATORY: No shortness of breath, cough or sputum.  GASTROINTESTINAL: No anorexia, nausea, vomiting or diarrhea. No abdominal pain or blood.  GENITOURINARY: No burning on urination, no polyuria NEUROLOGICAL: No headache, dizziness, syncope, paralysis, ataxia, numbness or tingling in the extremities. No change in bowel or bladder control.  MUSCULOSKELETAL: No muscle, back pain, joint pain or stiffness.  LYMPHATICS: No enlarged nodes. No history of splenectomy.  PSYCHIATRIC: No history of depression or anxiety.  ENDOCRINOLOGIC: No reports of sweating, cold or heat intolerance. No polyuria or polydipsia.  Marland Kitchen   Physical Examination There were no vitals filed for this visit. There were no vitals filed for this visit.  Gen: resting comfortably, no acute distress HEENT: no scleral icterus, pupils equal round and reactive, no palptable cervical adenopathy,  CV Resp: Clear to auscultation bilaterally GI: abdomen is soft, non-tender, non-distended, normal bowel sounds, no hepatosplenomegaly MSK: extremities are warm, no edema.  Skin: warm, no rash Neuro:  no focal deficits Psych: appropriate affect   Diagnostic Studies 07/2004 Cath The left main is a large vessel with no significant disease.  The LAD is a  medium size vessel which courses three quarters of the way down the anterior wall and has no significant disease. There are two small diagonal branches with no significant disease.  The left circumflex is a large vessel coursing through the AV groove. The third obtuse marginal Alassane Kalafut is free of the AV groove circumflex and noted to have a mild 30% narrowing after the takeoff the second OM.  The first OM is a small vessel which bifurcates in the proximal portion with no significant disease.  The second OM is a large vessel which bifurcates distally and has 50% mid vessel stenosis.  The third OM is a small vessel with no significant disease.  The right coronary artery is a large vessel which is dominant and gives rise to both the PDA as well as the posterolateral Aleena Kirkeby. The RCA is ectatic but has no high grade stenosis.  The PDA is a medium size vessel with no significant disease.  The PLA is a large vessel which bifurcates in the mid segment. There is 40% lesion in the proximal portion fo the lower bifurcation.  Left ventriculogram reveals a preserved ejection fraction of 60%.  Hemodynamics: The systemic arterial pressure was 112/69. Left ventricular pressure was 115/8. Left ventricular end diastolic pressure was 13.  CONCLUSION: 1. Non-critical coronary artery disease. 2. Normal left ventricular systolic function.   09/2012 Stress echo Study Conclusions  - Stress: There was resting hypertension with a hypertensive response to stress. Atypical chest pain was present before and during exercise without increase in intensity, and it persisted following conclusion of the test. - Stress ECG conclusions: No diagnostic ST abnormalities or arrhythmias. The stress ECG was negative for ischemia. [facility] scoring: exercise time of 86min; maximum ST deviation of 65mm; angina present but did not limit exercise; resulting score is -1.  This score predicts a moderate risk of cardiac events. - Staged echo: There was no echocardiographic evidence for stress-induced ischemia. - Peak stress: LV global systolic function was vigorous. The estimated LV ejection fraction was 70% to 75%. No evidence for new LV regional wall motion abnormalities.   07/2015 Nuclear stress test  There was no ST segment deviation noted during stress.  Defect 1: There is a medium defect of mild severity present in the basal inferior, mid inferior and apical inferior location, which is  likely due to soft tissue attenuation artifact. A mild degree of myocardial scar cannot entirely be ruled out.  This is a low risk study.  Nuclear stress EF: 62%.    Assessment and Plan  1.Chest pain - long history of atypical chest pain, symptoms overall unchanged over several years - negative ischemic evaluations in the past including cath in 2005, and most recently a lexiscan negative for ischemia 07/2015 - He has upcoming f/u with GI as well. No further cardiac testing at this time.  - EKG in clinic shows SR, no ischemic changes  2. HTN - his bp at goal, he will continue currentmeds  3. HL - continue staitn - reqeust labs from pcp   F/u 6 months.       Arnoldo Lenis, M.D., F.A.C.C.

## 2017-08-21 ENCOUNTER — Encounter: Payer: Self-pay | Admitting: Internal Medicine

## 2017-08-21 ENCOUNTER — Ambulatory Visit (INDEPENDENT_AMBULATORY_CARE_PROVIDER_SITE_OTHER): Payer: Medicare HMO | Admitting: Internal Medicine

## 2017-08-21 VITALS — BP 158/93 | HR 71 | Temp 97.0°F | Ht 66.0 in | Wt 208.4 lb

## 2017-08-21 DIAGNOSIS — K219 Gastro-esophageal reflux disease without esophagitis: Secondary | ICD-10-CM

## 2017-08-21 DIAGNOSIS — K5909 Other constipation: Secondary | ICD-10-CM | POA: Diagnosis not present

## 2017-08-21 NOTE — Progress Notes (Signed)
Primary Care Physician:  Jani Gravel, MD Primary Gastroenterologist:  Dr. Gala Romney  Pre-Procedure History & Physical: HPI:  Trevor Berg is a 66 y.o. male here for  Chronic constipation GERD. Symptoms well controlled  -  now on Protonix 40 mg daily. We started [name] at 145 daily. Needed a higher dose. Went to 290;  Having 4-5 bowel movements daily  -  sometimes to last strain. He has continued taking MiraLAX which was not  consistent with the plan. He is on a fiber supplement.  Abdominal pain associated with constipation. Recent EGD with esophageal dilation empirically has resulted in improvement in swallowing. He is edentulous. He has some difficulty severe tries to swallow too large of a piece of meat.  Past Medical History:  Diagnosis Date  . Anginal pain ([place])   . Anxiety   . Asthma   . Chest pain 2005   noncritical coronary disease in 2005: 40% distal RCA, 30% CX, 50% OM 2, EF-60%,  . COPD (chronic obstructive pulmonary disease) (HCC)    Chronic bronchitis; 100-pack-year cigarette consumption  . Gastroesophageal reflux disease    possible small Schatzki's ring; esophageal dilatation in 2005  . H. pylori infection 2014   treated with prevpac  . Hyperlipidemia   . Hypertension   . Overweight(278.02)   . Tobacco abuse    100 pack years    Past Surgical History:  Procedure Laterality Date  . CERVICAL SPINE SURGERY     C5-6 and C6-7: Relief of spinal stenosis with corpectomy, foraminotomy and fusion  . COLONOSCOPY  10/19/2004   RMR:   Friable anal canal, likely source of patient's hematochezia/  Otherwise normal rectum, normal colon.  . COLONOSCOPY N/A 09/09/2015   Procedure: COLONOSCOPY;  Surgeon: Daneil Dolin, MD;  [facility];  Service: Endoscopy;  Laterality: N/A;  1015  . ESOPHAGEAL DILATION N/A 09/09/2015   Procedure: ESOPHAGEAL DILATION;  Surgeon: Daneil Dolin, MD;  [facility];  Service: Endoscopy;  Laterality: N/A;  .  ESOPHAGOGASTRODUODENOSCOPY  09/27/2004   RMR:   A couple of tiny, distal esophageal erosions, otherwise normal esophagus aside from a Schatzki ring status post Maloney dilation as described above/ Small hiatal hernia, otherwise normal stomach, normal D1-D2  . ESOPHAGOGASTRODUODENOSCOPY N/A 09/09/2015   Procedure: ESOPHAGOGASTRODUODENOSCOPY (EGD);  Surgeon: Daneil Dolin, MD;  [facility];  Service: Endoscopy;  Laterality: N/A;  . ESOPHAGOGASTRODUODENOSCOPY N/A 04/19/2017   Procedure: ESOPHAGOGASTRODUODENOSCOPY (EGD);  Surgeon: Daneil Dolin, MD;  [facility];  Service: Endoscopy;  Laterality: N/A;  730   . ESOPHAGOGASTRODUODENOSCOPY (EGD) WITH ESOPHAGEAL DILATION N/A 03/19/2013   TKW:IOXBDZ esophagus s/p dilation/Abnormal stomach of uncertain clinical significance-s/p bx positive for H. pylori, status post Prevpac treatment  . MALONEY DILATION N/A 04/19/2017   Procedure: Venia Minks DILATION;  Surgeon: Daneil Dolin, MD;  [facility];  Service: Endoscopy;  Laterality: N/A;  . ORIF ANKLE FRACTURE     right  . ORIF TIBIA FRACTURE     Left  . TONSILLECTOMY      Prior to Admission medications   Medication Sig Start Date End Date Taking? Authorizing Provider  albuterol (PROVENTIL HFA;VENTOLIN HFA) 108 (90 BASE) MCG/ACT inhaler Inhale 2 puffs into the lungs every 6 (six) hours as needed for shortness of breath.    Yes [provider]  amLODipine (NORVASC) 2.5 MG tablet Take 2.5 mg by mouth daily.   Yes [provider]  Celedonio Miyamoto 62.5-25 MCG/INH  AEPB Inhale 1 puff into the lungs daily.  03/08/15  Yes [provider]  aspirin EC 81 MG tablet Take 81 mg by mouth daily.   Yes [provider]  atenolol (TENORMIN) 50 MG tablet TAKE ONE TABLET BY MOUTH ONCE DAILY IN THE MORNING 07/27/16  Yes Branch, Alphonse Guild, MD  atorvastatin (LIPITOR) 20 MG tablet Take 20 mg by mouth every morning.   Yes [provider]  DEXILANT 60 MG  capsule Take 60 mg by mouth daily.  01/28/14  Yes [provider]  dextromethorphan-guaiFENesin (MUCINEX DM) 30-600 MG 12hr tablet Take 1 tablet by mouth every 12 (twelve) hours.    Yes [provider]  hydrochlorothiazide (MICROZIDE) 12.5 MG capsule TAKE 1 CAPSULE BY MOUTH ONCE A DAY FOR HTN/SWELLING 06/29/17  Yes [provider]  ibuprofen (ADVIL,MOTRIN) 800 MG tablet Take 800 mg by mouth 3 (three) times daily as needed for moderate pain.  04/26/15  Yes [provider]  [name] 290 MCG CAPS capsule Take 290 mcg by mouth daily.  06/27/17  Yes [provider]  lisinopril (PRINIVIL,ZESTRIL) 40 MG tablet Take 1 tablet (40 mg total) by mouth daily. 11/06/16  Yes Branch, Alphonse Guild, MD  LORazepam (ATIVAN) 1 MG tablet Take 1 mg by mouth 3 (three) times daily as needed for anxiety.    Yes [provider]  Naphazoline HCl (CLEAR EYES OP) Apply 1 drop to eye daily as needed (dry eyes).   Yes [provider]  nitroGLYCERIN (NITROSTAT) 0.4 MG SL tablet Place 0.4 mg under the tongue every 5 (five) minutes as needed for chest pain. Chest Pain   Yes [provider]  pantoprazole (PROTONIX) 40 MG tablet Take 40 mg by mouth daily.   Yes [provider]  polyethylene glycol powder (GLYCOLAX/MIRALAX) powder USE 1 CAPFUL DAILY AS NEEDED FOR CONSTIPATION Patient taking differently: USE 1 CAPFUL DAILY 11/08/16  Yes Annitta Needs, NP  sertraline (ZOLOFT) 50 MG tablet Take 50 mg by mouth daily.   Yes [provider]  simvastatin (ZOCOR) 20 MG tablet Take 20 mg by mouth at bedtime.   Yes [provider]  traZODone (DESYREL) 50 MG tablet Take 50 mg by mouth at bedtime. 04/09/17  Yes [provider]  vitamin B-12 (CYANOCOBALAMIN) 1000 MCG tablet Take 2,000 mcg by mouth daily.    Yes [provider]    Allergies as of 08/21/2017 - Review Complete 08/21/2017  Allergen Reaction Noted  . Chantix [varenicline]   02/23/2014  . Tomato Rash 06/16/2011    Family History  Problem Relation Age of Onset  . Heart attack Mother        deceased, age 83s  . Ulcers Brother   . Diabetes Brother   . Hypertension Unknown   . Colon cancer Neg Hx     Social History   Social History  . Marital status: Married    Spouse name: N/A  . Number of children: 3  . Years of education: N/A   Occupational History  . disability    Social History Main Topics  . Smoking status: Current Every Day Smoker    Packs/day: 1.00    Years: 50.00    Types: Cigarettes  . Smokeless tobacco: Never Used     Comment: 1 pack per day as of 08/2012  . Alcohol use No     Comment: occasionally, usually holidays but sometimes on weekends, prior heavy use  . Drug use: No  . Sexual activity: Yes  Other Topics Concern  . Not on file   Social History Narrative  . No narrative on file    Review of Systems: See HPI, otherwise negative ROS  Physical Exam: BP (!) 158/93   Pulse 71   Temp (!) 97 F (36.1 C) (Oral)   Ht 5\' 6"  (1.676 m)   Wt 208 lb 6.4 oz (94.5 kg)   BMI 33.64 kg/m  General:   Somewhat disheveled gentleman pleasant and cooperative in NAD; accompanied by wife. Neck:  Supple; no masses or thyromegaly. No significant cervical adenopathy. Lungs:  Clear throughout to auscultation.   No wheezes, crackles, or rhonchi. No acute distress. Heart:  Regular rate and rhythm; no murmurs, clicks, rubs,  or gallops. Abdomen: Non-distended, normal bowel sounds.  Soft and nontender without appreciable mass or hepatosplenomegaly.  Pulses:  Normal pulses noted. Extremities:  Without clubbing or edema.  1. Impression:  GERD well controlled on Protonix 40 mg daily. Dysphagia in association with solid food. Lack of teeth predisposes him to issues. Recent Maloney dilation (empirically) has helped with swallowing. 2.   Continue protonix 40 mg daily  Continue linzess 290 daily  Stop miralax for now  Begin Benefiber 1  tablespoon each day  Office visit in 3 months   Notice: This dictation was prepared with Dragon dictation along with smaller phrase technology. Any transcriptional errors that result from this process are unintentional and may not be corrected upon review.

## 2017-08-21 NOTE — Patient Instructions (Addendum)
Continue protonix 40 mg daily  Continue linzess 290 daily  Stop miralax for now  Begin Benefiber 1 tablespoon each day  Office visit in 3 months

## 2017-08-30 ENCOUNTER — Ambulatory Visit: Payer: Medicare HMO | Admitting: Cardiology

## 2017-08-30 ENCOUNTER — Encounter: Payer: Self-pay | Admitting: Cardiology

## 2017-08-30 NOTE — Progress Notes (Deleted)
Clinical Summary [name] is a 66 y.o.male seen today for follow up of the following medical problems  1.CAD - long history of chest pain - cath in 2005 without mild to moderate non-obstructive disease - negative stress echo 09/2012, baseline LVEF 55-60% - following avisit with PA Lenze a Lexiscan was ordered due to chest pain. It showed an inferior defect due to subdiaphragmatic attenuation, no ischemia.  - of note he has history of GERD with esophageal dysphagia followed by GI. Has upcoming EGD.  - pain unchanged since last visit. Pain atypical in that it can last can several last up to 24 hrs constantly.     - still with chest pains overall unchanged. Has upcoming appointment with GI. - stable SOB/DOE.    - seen in ER 06/2017 with chest pain described as atypical, constant for 1 week. Negative troponin without acute EKG chagnes -   2. COPD - compliant with inhalers  3. HTN - compliant with meds   4. Hyperlipidemia  - upcoming labs with pcp - he is compliant with staitn  5. AAA screen - history of smoking, will require AAA [place] later this year at age 11   Past Medical History:  Diagnosis Date  . Anginal pain ([place])   . Anxiety   . Asthma   . Chest pain 2005   noncritical coronary disease in 2005: 40% distal RCA, 30% CX, 50% OM 2, EF-60%,  . COPD (chronic obstructive pulmonary disease) (HCC)    Chronic bronchitis; 100-pack-year cigarette consumption  . Gastroesophageal reflux disease    possible small Schatzki's ring; esophageal dilatation in 2005  . H. pylori infection 2014   treated with prevpac  . Hyperlipidemia   . Hypertension   . Overweight(278.02)   . Tobacco abuse    100 pack years     Allergies  Allergen Reactions  . Chantix [Varenicline]     Nightmares and personality changes.  . Tomato Rash     Current Outpatient Prescriptions  Medication Sig Dispense Refill  . albuterol (PROVENTIL HFA;VENTOLIN HFA) 108 (90 BASE) MCG/ACT  inhaler Inhale 2 puffs into the lungs every 6 (six) hours as needed for shortness of breath.     Marland Kitchen amLODipine (NORVASC) 2.5 MG tablet Take 2.5 mg by mouth daily.    Jearl Klinefelter ELLIPTA 62.5-25 MCG/INH AEPB Inhale 1 puff into the lungs daily.     Marland Kitchen aspirin EC 81 MG tablet Take 81 mg by mouth daily.    Marland Kitchen atenolol (TENORMIN) 50 MG tablet TAKE ONE TABLET BY MOUTH ONCE DAILY IN THE MORNING 30 tablet 3  . atorvastatin (LIPITOR) 20 MG tablet Take 20 mg by mouth every morning.    Marland Kitchen DEXILANT 60 MG capsule Take 60 mg by mouth daily.     Marland Kitchen dextromethorphan-guaiFENesin (MUCINEX DM) 30-600 MG 12hr tablet Take 1 tablet by mouth every 12 (twelve) hours.     . hydrochlorothiazide (MICROZIDE) 12.5 MG capsule TAKE 1 CAPSULE BY MOUTH ONCE A DAY FOR HTN/SWELLING  3  . ibuprofen (ADVIL,MOTRIN) 800 MG tablet Take 800 mg by mouth 3 (three) times daily as needed for moderate pain.     Marland Kitchen [name] 290 MCG CAPS capsule Take 290 mcg by mouth daily.   5  . lisinopril (PRINIVIL,ZESTRIL) 40 MG tablet Take 1 tablet (40 mg total) by mouth daily. 90 tablet 3  . LORazepam (ATIVAN) 1 MG tablet Take 1 mg by mouth 3 (three) times daily as needed for anxiety.     Marland Kitchen  Naphazoline HCl (CLEAR EYES OP) Apply 1 drop to eye daily as needed (dry eyes).    . nitroGLYCERIN (NITROSTAT) 0.4 MG SL tablet Place 0.4 mg under the tongue every 5 (five) minutes as needed for chest pain. Chest Pain    . pantoprazole (PROTONIX) 40 MG tablet Take 40 mg by mouth daily.    . polyethylene glycol powder (GLYCOLAX/MIRALAX) powder USE 1 CAPFUL DAILY AS NEEDED FOR CONSTIPATION (Patient taking differently: USE 1 CAPFUL DAILY) 527 g 11  . sertraline (ZOLOFT) 50 MG tablet Take 50 mg by mouth daily.    . simvastatin (ZOCOR) 20 MG tablet Take 20 mg by mouth at bedtime.    . traZODone (DESYREL) 50 MG tablet Take 50 mg by mouth at bedtime.  0  . vitamin B-12 (CYANOCOBALAMIN) 1000 MCG tablet Take 2,000 mcg by mouth daily.      No current facility-administered medications  for this visit.      Past Surgical History:  Procedure Laterality Date  . CERVICAL SPINE SURGERY     C5-6 and C6-7: Relief of spinal stenosis with corpectomy, foraminotomy and fusion  . COLONOSCOPY  10/19/2004   RMR:   Friable anal canal, likely source of patient's hematochezia/  Otherwise normal rectum, normal colon.  . COLONOSCOPY N/A 09/09/2015   Procedure: COLONOSCOPY;  Surgeon: Daneil Dolin, MD;  [facility];  Service: Endoscopy;  Laterality: N/A;  1015  . ESOPHAGEAL DILATION N/A 09/09/2015   Procedure: ESOPHAGEAL DILATION;  Surgeon: Daneil Dolin, MD;  [facility];  Service: Endoscopy;  Laterality: N/A;  . ESOPHAGOGASTRODUODENOSCOPY  09/27/2004   RMR:   A couple of tiny, distal esophageal erosions, otherwise normal esophagus aside from a Schatzki ring status post Maloney dilation as described above/ Small hiatal hernia, otherwise normal stomach, normal D1-D2  . ESOPHAGOGASTRODUODENOSCOPY N/A 09/09/2015   Procedure: ESOPHAGOGASTRODUODENOSCOPY (EGD);  Surgeon: Daneil Dolin, MD;  [facility];  Service: Endoscopy;  Laterality: N/A;  . ESOPHAGOGASTRODUODENOSCOPY N/A 04/19/2017   Procedure: ESOPHAGOGASTRODUODENOSCOPY (EGD);  Surgeon: Daneil Dolin, MD;  [facility];  Service: Endoscopy;  Laterality: N/A;  730   . ESOPHAGOGASTRODUODENOSCOPY (EGD) WITH ESOPHAGEAL DILATION N/A 03/19/2013   IRC:VELFYB esophagus s/p dilation/Abnormal stomach of uncertain clinical significance-s/p bx positive for H. pylori, status post Prevpac treatment  . MALONEY DILATION N/A 04/19/2017   Procedure: Venia Minks DILATION;  Surgeon: Daneil Dolin, MD;  [facility];  Service: Endoscopy;  Laterality: N/A;  . ORIF ANKLE FRACTURE     right  . ORIF TIBIA FRACTURE     Left  . TONSILLECTOMY       Allergies  Allergen Reactions  . Chantix [Varenicline]     Nightmares and personality changes.  . Tomato Rash      Family History  Problem Relation  Age of Onset  . Heart attack Mother        deceased, age 54s  . Ulcers Brother   . Diabetes Brother   . Hypertension Unknown   . Colon cancer Neg Hx      Social History Mr. Barkan reports that he has been smoking Cigarettes.  He has a 50.00 pack-year smoking history. He has never used smokeless tobacco. Mr. Stipes reports that he does not drink alcohol.   Review of Systems CONSTITUTIONAL: No weight loss, fever, chills, weakness or fatigue.  HEENT: Eyes: No visual loss, blurred vision, double vision or yellow sclerae.No hearing loss, sneezing, congestion, runny nose or sore throat.  SKIN: No rash or itching.  CARDIOVASCULAR:  RESPIRATORY: No shortness of breath, cough or sputum.  GASTROINTESTINAL: No anorexia, nausea, vomiting or diarrhea. No abdominal pain or blood.  GENITOURINARY: No burning on urination, no polyuria NEUROLOGICAL: No headache, dizziness, syncope, paralysis, ataxia, numbness or tingling in the extremities. No change in bowel or bladder control.  MUSCULOSKELETAL: No muscle, back pain, joint pain or stiffness.  LYMPHATICS: No enlarged nodes. No history of splenectomy.  PSYCHIATRIC: No history of depression or anxiety.  ENDOCRINOLOGIC: No reports of sweating, cold or heat intolerance. No polyuria or polydipsia.  Marland Kitchen   Physical Examination There were no vitals filed for this visit. There were no vitals filed for this visit.  Gen: resting comfortably, no acute distress HEENT: no scleral icterus, pupils equal round and reactive, no palptable cervical adenopathy,  CV Resp: Clear to auscultation bilaterally GI: abdomen is soft, non-tender, non-distended, normal bowel sounds, no hepatosplenomegaly MSK: extremities are warm, no edema.  Skin: warm, no rash Neuro:  no focal deficits Psych: appropriate affect   Diagnostic Studies 07/2004 Cath The left main is a large vessel with no significant disease.  The LAD is a medium size vessel which courses three quarters  of the way down the anterior wall and has no significant disease. There are two small diagonal branches with no significant disease.  The left circumflex is a large vessel coursing through the AV groove. The third obtuse marginal branch is free of the AV groove circumflex and noted to have a mild 30% narrowing after the takeoff the second OM.  The first OM is a small vessel which bifurcates in the proximal portion with no significant disease.  The second OM is a large vessel which bifurcates distally and has 50% mid vessel stenosis.  The third OM is a small vessel with no significant disease.  The right coronary artery is a large vessel which is dominant and gives rise to both the PDA as well as the posterolateral branch. The RCA is ectatic but has no high grade stenosis.  The PDA is a medium size vessel with no significant disease.  The PLA is a large vessel which bifurcates in the mid segment. There is 40% lesion in the proximal portion fo the lower bifurcation.  Left ventriculogram reveals a preserved ejection fraction of 60%.  Hemodynamics: The systemic arterial pressure was 112/69. Left ventricular pressure was 115/8. Left ventricular end diastolic pressure was 13.  CONCLUSION: 1. Non-critical coronary artery disease. 2. Normal left ventricular systolic function.   09/2012 Stress echo Study Conclusions  - Stress: There was resting hypertension with a hypertensive response to stress. Atypical chest pain was present before and during exercise without increase in intensity, and it persisted following conclusion of the test. - Stress ECG conclusions: No diagnostic ST abnormalities or arrhythmias. The stress ECG was negative for ischemia. [facility] scoring: exercise time of 70min; maximum ST deviation of 35mm; angina present but did not limit exercise; resulting score is -1. This score predicts a moderate risk of  cardiac events. - Staged echo: There was no echocardiographic evidence for stress-induced ischemia. - Peak stress: LV global systolic function was vigorous. The estimated LV ejection fraction was 70% to 75%. No evidence for new LV regional wall motion abnormalities.   07/2015 Nuclear stress test  There was no ST segment deviation noted during stress.  Defect 1: There is a medium defect of mild severity present in the basal inferior, mid inferior and apical inferior location, which is likely due  to soft tissue attenuation artifact. A mild degree of myocardial scar cannot entirely be ruled out.  This is a low risk study.  Nuclear stress EF: 62%.    Assessment and Plan  1.Chest pain - long history of atypical chest pain, symptoms overall unchanged over several years - negative ischemic evaluations in the past including cath in 2005, and most recently a lexiscan negative for ischemia 07/2015 - He has upcoming f/u with GI as well. No further cardiac testing at this time.  - EKG in clinic shows SR, no ischemic changes  2. HTN - his bp at goal, he will continue currentmeds  3. HL - continue staitn - reqeust labs from pcp   F/u 6 months.       Arnoldo Lenis, M.D., F.A.C.C.

## 2017-08-31 ENCOUNTER — Encounter: Payer: Self-pay | Admitting: Cardiology

## 2017-08-31 ENCOUNTER — Ambulatory Visit (INDEPENDENT_AMBULATORY_CARE_PROVIDER_SITE_OTHER): Payer: Medicare HMO | Admitting: Cardiology

## 2017-08-31 VITALS — BP 114/66 | HR 60 | Ht 66.0 in | Wt 207.0 lb

## 2017-08-31 DIAGNOSIS — Z23 Encounter for immunization: Secondary | ICD-10-CM | POA: Diagnosis not present

## 2017-08-31 DIAGNOSIS — Z136 Encounter for screening for cardiovascular disorders: Secondary | ICD-10-CM | POA: Diagnosis not present

## 2017-08-31 DIAGNOSIS — I10 Essential (primary) hypertension: Secondary | ICD-10-CM

## 2017-08-31 DIAGNOSIS — R0789 Other chest pain: Secondary | ICD-10-CM | POA: Diagnosis not present

## 2017-08-31 DIAGNOSIS — E782 Mixed hyperlipidemia: Secondary | ICD-10-CM | POA: Diagnosis not present

## 2017-08-31 NOTE — Progress Notes (Signed)
Clinical Summary [name] is a 66 y.o.male seen today for follow up of the following medical problems  1.CAD - long history of chest pain - cath in 2005 without mild to moderate non-obstructive disease - negative stress echo 09/2012, baseline LVEF 55-60% - 07/2015 Lexiscan was ordered due to chest pain. It showed an inferior defect due to subdiaphragmatic attenuation, no ischemia.   - recent 03/2017 EGD with multiple erosions of stomach - still with chest pains overall unchanged. Has upcoming appointment with GI. - stable SOB/DOE.   -06/2017 ER visit with chest pain, atypical constant pain.  - still with pain at times - compliant with meds  2. COPD - compliant with inhalers  3. HTN - he is compliant with bp mds   4. Hyperlipidemia  - has labs coming up with pcp - 02/2017 TC 154 TG 120 HDL 36 LDL 94   5. AAA screen - history of smoking, male age 4   SH: works Physiological scientist Past Medical History:  Diagnosis Date  . Anginal pain ([place])   . Anxiety   . Asthma   . Chest pain 2005   noncritical coronary disease in 2005: 40% distal RCA, 30% CX, 50% OM 2, EF-60%,  . COPD (chronic obstructive pulmonary disease) ([facility])    Chronic bronchitis; 100-pack-year cigarette consumption  . Gastroesophageal reflux disease    possible small Schatzki's ring; esophageal dilatation in 2005  . H. pylori infection 2014   treated with prevpac  . Hyperlipidemia   . Hypertension   . Overweight(278.02)   . Tobacco abuse    100 pack years     Allergies  Allergen Reactions  . Chantix [Varenicline]     Nightmares and personality changes.  . Tomato Rash     Current Outpatient Prescriptions  Medication Sig Dispense Refill  . albuterol (PROVENTIL HFA;VENTOLIN HFA) 108 (90 BASE) MCG/ACT inhaler Inhale 2 puffs into the lungs every 6 (six) hours as needed for shortness of breath.     Marland Kitchen amLODipine (NORVASC) 2.5 MG tablet Take 2.5 mg by mouth daily.    Trevor Berg ELLIPTA 62.5-25 MCG/INH  AEPB Inhale 1 puff into the lungs daily.     Marland Kitchen aspirin EC 81 MG tablet Take 81 mg by mouth daily.    Marland Kitchen atenolol (TENORMIN) 50 MG tablet TAKE ONE TABLET BY MOUTH ONCE DAILY IN THE MORNING 30 tablet 3  . atorvastatin (LIPITOR) 20 MG tablet Take 20 mg by mouth every morning.    Marland Kitchen DEXILANT 60 MG capsule Take 60 mg by mouth daily.     Marland Kitchen dextromethorphan-guaiFENesin (MUCINEX DM) 30-600 MG 12hr tablet Take 1 tablet by mouth every 12 (twelve) hours.     . hydrochlorothiazide (MICROZIDE) 12.5 MG capsule TAKE 1 CAPSULE BY MOUTH ONCE A DAY FOR HTN/SWELLING  3  . ibuprofen (ADVIL,MOTRIN) 800 MG tablet Take 800 mg by mouth 3 (three) times daily as needed for moderate pain.     Marland Kitchen [name] 290 MCG CAPS capsule Take 290 mcg by mouth daily.   5  . lisinopril (PRINIVIL,ZESTRIL) 40 MG tablet Take 1 tablet (40 mg total) by mouth daily. 90 tablet 3  . LORazepam (ATIVAN) 1 MG tablet Take 1 mg by mouth 3 (three) times daily as needed for anxiety.     . Naphazoline HCl (CLEAR EYES OP) Apply 1 drop to eye daily as needed (dry eyes).    . nitroGLYCERIN (NITROSTAT) 0.4 MG SL tablet Place 0.4 mg under the tongue every 5 (five)  minutes as needed for chest pain. Chest Pain    . pantoprazole (PROTONIX) 40 MG tablet Take 40 mg by mouth daily.    . polyethylene glycol powder (GLYCOLAX/MIRALAX) powder USE 1 CAPFUL DAILY AS NEEDED FOR CONSTIPATION (Patient taking differently: USE 1 CAPFUL DAILY) 527 g 11  . sertraline (ZOLOFT) 50 MG tablet Take 50 mg by mouth daily.    . simvastatin (ZOCOR) 20 MG tablet Take 20 mg by mouth at bedtime.    . traZODone (DESYREL) 50 MG tablet Take 50 mg by mouth at bedtime.  0  . vitamin B-12 (CYANOCOBALAMIN) 1000 MCG tablet Take 2,000 mcg by mouth daily.      No current facility-administered medications for this visit.      Past Surgical History:  Procedure Laterality Date  . CERVICAL SPINE SURGERY     C5-6 and C6-7: Relief of spinal stenosis with corpectomy, foraminotomy and fusion  .  COLONOSCOPY  10/19/2004   RMR:   Friable anal canal, likely source of patient's hematochezia/  Otherwise normal rectum, normal colon.  . COLONOSCOPY N/A 09/09/2015   Procedure: COLONOSCOPY;  Surgeon: Trevor Dolin, MD;  Location: [facility];  Service: Endoscopy;  Laterality: N/A;  1015  . ESOPHAGEAL DILATION N/A 09/09/2015   Procedure: ESOPHAGEAL DILATION;  Surgeon: Trevor Dolin, MD;  Location: [facility];  Service: Endoscopy;  Laterality: N/A;  . ESOPHAGOGASTRODUODENOSCOPY  09/27/2004   RMR:   A couple of tiny, distal esophageal erosions, otherwise normal esophagus aside from a Schatzki ring status post Maloney dilation as described above/ Small hiatal hernia, otherwise normal stomach, normal D1-D2  . ESOPHAGOGASTRODUODENOSCOPY N/A 09/09/2015   Procedure: ESOPHAGOGASTRODUODENOSCOPY (EGD);  Surgeon: Trevor Dolin, MD;  [facility]: [facility];  Service: Endoscopy;  Laterality: N/A;  . ESOPHAGOGASTRODUODENOSCOPY N/A 04/19/2017   Procedure: ESOPHAGOGASTRODUODENOSCOPY (EGD);  Surgeon: Trevor Dolin, MD;  [facility]: [facility];  Service: Endoscopy;  Laterality: N/A;  730   . ESOPHAGOGASTRODUODENOSCOPY (EGD) WITH ESOPHAGEAL DILATION N/A 03/19/2013   QIH:[id number] esophagus s/p dilation/Abnormal stomach of uncertain clinical significance-s/p bx positive for H. pylori, status post Prevpac treatment  . MALONEY DILATION N/A 04/19/2017   Procedure: Trevor Berg DILATION;  Surgeon: Trevor Dolin, MD;  Location: [facility];  Service: Endoscopy;  Laterality: N/A;  . ORIF ANKLE FRACTURE     right  . ORIF TIBIA FRACTURE     Left  . TONSILLECTOMY       Allergies  Allergen Reactions  . Chantix [Varenicline]     Nightmares and personality changes.  . Tomato Rash      Family History  Problem Relation Age of Onset  . Heart attack Mother        deceased, age 83s  . Ulcers Brother   . Diabetes Brother   . Hypertension Unknown   . Colon cancer Neg Hx      Social History Trevor Berg  reports that he has been smoking Cigarettes.  He has a 50.00 pack-year smoking history. He has never used smokeless tobacco. Trevor Berg reports that he does not drink alcohol.   Review of Systems CONSTITUTIONAL: No weight loss, fever, chills, weakness or fatigue.  HEENT: Eyes: No visual loss, blurred vision, double vision or yellow sclerae.No hearing loss, sneezing, congestion, runny nose or sore throat.  SKIN: No rash or itching.  CARDIOVASCULAR: per hpi RESPIRATORY: No shortness of breath, cough or sputum.  GASTROINTESTINAL: No anorexia, nausea, vomiting or diarrhea. No abdominal pain or blood.  GENITOURINARY: No burning  on urination, no polyuria NEUROLOGICAL: No headache, dizziness, syncope, paralysis, ataxia, numbness or tingling in the extremities. No change in bowel or bladder control.  MUSCULOSKELETAL: No muscle, back pain, joint pain or stiffness.  LYMPHATICS: No enlarged nodes. No history of splenectomy.  PSYCHIATRIC: No history of depression or anxiety.  ENDOCRINOLOGIC: No reports of sweating, cold or heat intolerance. No polyuria or polydipsia.  Marland Kitchen   Physical Examination Vitals:   08/31/17 1301  BP: 114/66  Pulse: 60  SpO2: 91%   Vitals:   08/31/17 1301  Weight: 207 lb (93.9 kg)  Height: 5\' 6"  (1.676 m)    Gen: resting comfortably, no acute distress HEENT: no scleral icterus, pupils equal round and reactive, no palptable cervical adenopathy,  CV: RRR, no m/r/g, no jvd Resp: Clear to auscultation bilaterally GI: abdomen is soft, non-tender, non-distended, normal bowel sounds, no hepatosplenomegaly MSK: extremities are warm, no edema.  Skin: warm, no rash Neuro:  no focal deficits Psych: appropriate affect   Diagnostic Studies  07/2004 Cath The left main is a large vessel with no significant disease.  The LAD is a medium size vessel which courses three quarters of the way down the anterior wall and has no significant disease. There are two small diagonal  branches with no significant disease.  The left circumflex is a large vessel coursing through the AV groove. The third obtuse marginal Jaquarius Seder is free of the AV groove circumflex and noted to have a mild 30% narrowing after the takeoff the second OM.  The first OM is a small vessel which bifurcates in the proximal portion with no significant disease.  The second OM is a large vessel which bifurcates distally and has 50% mid vessel stenosis.  The third OM is a small vessel with no significant disease.  The right coronary artery is a large vessel which is dominant and gives rise to both the PDA as well as the posterolateral Sharian Delia. The RCA is ectatic but has no high grade stenosis.  The PDA is a medium size vessel with no significant disease.  The PLA is a large vessel which bifurcates in the mid segment. There is 40% lesion in the proximal portion fo the lower bifurcation.  Left ventriculogram reveals a preserved ejection fraction of 60%.  Hemodynamics: The systemic arterial pressure was 112/69. Left ventricular pressure was 115/8. Left ventricular end diastolic pressure was 13.  CONCLUSION: 1. Non-critical coronary artery disease. 2. Normal left ventricular systolic function.   09/2012 Stress echo Study Conclusions  - Stress: There was resting hypertension with a hypertensive response to stress. Atypical chest pain was present before and during exercise without increase in intensity, and it persisted following conclusion of the test. - Stress ECG conclusions: No diagnostic ST abnormalities or arrhythmias. The stress ECG was negative for ischemia. [facility] scoring: exercise time of 27min; maximum ST deviation of 49mm; angina present but did not limit exercise; resulting score is -1. This score predicts a moderate risk of cardiac events. - Staged echo: There was no echocardiographic evidence for stress-induced ischemia. -  Peak stress: LV global systolic function was vigorous. The estimated LV ejection fraction was 70% to 75%. No evidence for new LV regional wall motion abnormalities.   07/2015 Nuclear stress test  There was no ST segment deviation noted during stress.  Defect 1: There is a medium defect of mild severity present in the basal inferior, mid inferior and apical inferior location, which is likely due to soft tissue attenuation artifact. A mild degree  of myocardial scar cannot entirely be ruled out.  This is a low risk study.  Nuclear stress EF: 62%.    Assessment and Plan  1.Chest pain - long history of atypical chest pain, symptoms overall unchanged over several years - negative ischemic evaluations in the past including cath in 2005, and most recently a lexiscan negative for ischemia 07/2015 - recent symptoms noncardiac in description - continue current meds  2. HTN - bp at goal, continue current meds  3. HL - he will continue statin, we will request pcp lab results  4. AAA screen - 66 yo male with tobacco history, order AAA [place]   F/u 6 months.        Arnoldo Lenis, M.D.

## 2017-08-31 NOTE — Patient Instructions (Signed)
Medication Instructions:  Your physician recommends that you continue on your current medications as directed. Please refer to the Current Medication list given to you today.   Labwork: none  Testing/Procedures:  AAA screening   Follow-Up: Your physician wants you to follow-up in: 6 months.  You will receive a reminder letter in the mail two months in advance. If you don't receive a letter, please call our office to schedule the follow-up appointment.   Any Other Special Instructions Will Be Listed Below (If Applicable).  You have been given a flu shot today.    If you need a refill on your cardiac medications before your next appointment, please call your pharmacy.

## 2017-09-02 ENCOUNTER — Encounter: Payer: Self-pay | Admitting: Cardiology

## 2017-09-06 ENCOUNTER — Ambulatory Visit (HOSPITAL_COMMUNITY)
Admission: RE | Admit: 2017-09-06 | Discharge: 2017-09-06 | Disposition: A | Discharge status: Home / Self Care | Payer: Medicare HMO | Source: Ambulatory Visit | Attending: Cardiology | Admitting: Cardiology

## 2017-09-06 DIAGNOSIS — Z136 Encounter for screening for cardiovascular disorders: Secondary | ICD-10-CM | POA: Diagnosis present

## 2017-09-06 DIAGNOSIS — I700 Atherosclerosis of aorta: Secondary | ICD-10-CM | POA: Diagnosis not present

## 2017-10-11 ENCOUNTER — Encounter: Payer: Self-pay | Admitting: Internal Medicine

## 2017-10-17 ENCOUNTER — Other Ambulatory Visit: Payer: Self-pay | Admitting: *Deleted

## 2017-10-18 MED ORDER — LINZESS 290 MCG PO CAPS: 290.0000 ug | ORAL_CAPSULE | Freq: Every day | ORAL | 3 refills | Status: DC

## 2017-11-07 ENCOUNTER — Ambulatory Visit (INDEPENDENT_AMBULATORY_CARE_PROVIDER_SITE_OTHER): Payer: Medicare HMO | Admitting: Orthopaedic Surgery

## 2017-11-07 ENCOUNTER — Encounter: Payer: Self-pay | Admitting: Orthopaedic Surgery

## 2017-11-07 VITALS — BP 134/86 | HR 70 | Ht 67.0 in | Wt 208.0 lb

## 2017-11-07 DIAGNOSIS — G8929 Other chronic pain: Secondary | ICD-10-CM | POA: Diagnosis not present

## 2017-11-07 DIAGNOSIS — S83241D Other tear of medial meniscus, current injury, right knee, subsequent encounter: Secondary | ICD-10-CM

## 2017-11-07 DIAGNOSIS — M25561 Pain in right knee: Secondary | ICD-10-CM

## 2017-11-07 NOTE — Progress Notes (Signed)
He was seen here last February with knee pain.  He had MRI which showed tear of the right knee medial meniscus.  He did not return.  He has more pain now, giving way, swelling and not doing well.  I will have Dr. Aline Brochure see him for possible arthroscopy of the right knee.  PROCEDURE NOTE:  The patient requests injections of the right knee , verbal consent was obtained.  The right knee was prepped appropriately after time out was performed.   Sterile technique was observed and injection of 1 cc of Depo-Medrol 40 mg with several cc's of plain xylocaine. Anesthesia was provided by ethyl chloride and a 20-gauge needle was used to inject the knee area. The injection was tolerated well.  A band aid dressing was applied.  The patient was advised to apply ice later today and tomorrow to the injection sight as needed.  Electronically Signed Sanjuana Kava, MD 1/9/20192:38 PM

## 2017-11-07 NOTE — Patient Instructions (Signed)
Steps to Quit Smoking Smoking tobacco can be bad for your health. It can also affect almost every organ in your body. Smoking puts you and people around you at risk for many serious long-lasting (chronic) diseases. Quitting smoking is hard, but it is one of the best things that you can do for your health. It is never too late to quit. What are the benefits of quitting smoking? When you quit smoking, you lower your risk for getting serious diseases and conditions. They can include:  Lung cancer or lung disease.  Heart disease.  Stroke.  Heart attack.  Not being able to have children (infertility).  Weak bones (osteoporosis) and broken bones (fractures).  If you have coughing, wheezing, and shortness of breath, those symptoms may get better when you quit. You may also get sick less often. If you are pregnant, quitting smoking can help to lower your chances of having a baby of low birth weight. What can I do to help me quit smoking? Talk with your doctor about what can help you quit smoking. Some things you can do (strategies) include:  Quitting smoking totally, instead of slowly cutting back how much you smoke over a period of time.  Going to in-person counseling. You are more likely to quit if you go to many counseling sessions.  Using resources and support systems, such as: ? Online chats with a counselor. ? Phone quitlines. ? Printed self-help materials. ? Support groups or group counseling. ? Text messaging programs. ? Mobile phone apps or applications.  Taking medicines. Some of these medicines may have nicotine in them. If you are pregnant or breastfeeding, do not take any medicines to quit smoking unless your doctor says it is okay. Talk with your doctor about counseling or other things that can help you.  Talk with your doctor about using more than one strategy at the same time, such as taking medicines while you are also going to in-person counseling. This can help make  quitting easier. What things can I do to make it easier to quit? Quitting smoking might feel very hard at first, but there is a lot that you can do to make it easier. Take these steps:  Talk to your family and friends. Ask them to support and encourage you.  Call phone quitlines, reach out to support groups, or work with a counselor.  Ask people who smoke to not smoke around you.  Avoid places that make you want (trigger) to smoke, such as: ? Bars. ? Parties. ? Smoke-break areas at work.  Spend time with people who do not smoke.  Lower the stress in your life. Stress can make you want to smoke. Try these things to help your stress: ? Getting regular exercise. ? Deep-breathing exercises. ? Yoga. ? Meditating. ? Doing a body scan. To do this, close your eyes, focus on one area of your body at a time from head to toe, and notice which parts of your body are tense. Try to relax the muscles in those areas.  Download or buy apps on your mobile phone or tablet that can help you stick to your quit plan. There are many free apps, such as QuitGuide from the CDC (Centers for Disease Control and Prevention). You can find more support from smokefree.gov and other websites.  This information is not intended to replace advice given to you by your health care provider. Make sure you discuss any questions you have with your health care provider. Document Released: 08/12/2009 Document   Revised: 06/13/2016 Document Reviewed: 03/02/2015 Elsevier Interactive Patient Education  2018 [employer]  Knee Exercises Ask your health care provider which exercises are safe for you. Do exercises exactly as told by your health care provider and adjust them as directed. It is normal to feel mild stretching, pulling, tightness, or discomfort as you do these exercises, but you should stop right away if you feel sudden pain or your pain gets worse.Do not begin these exercises until told by your health care  provider. STRETCHING AND RANGE OF MOTION EXERCISES These exercises warm up your muscles and joints and improve the movement and flexibility of your knee. These exercises also help to relieve pain, numbness, and tingling. Exercise A: Knee Extension, Prone 1. Lie on your abdomen on a bed. 2. Place your left / right knee just beyond the edge of the surface so your knee is not on the bed. You can put a towel under your left / right thigh just above your knee for comfort. 3. Relax your leg muscles and allow gravity to straighten your knee. You should feel a stretch behind your left / right knee. 4. Hold this position for __________ seconds. 5. Scoot up so your knee is supported between repetitions. Repeat __________ times. Complete this stretch __________ times a day. Exercise B: Knee Flexion, Active  1. Lie on your back with both knees straight. If this causes back discomfort, bend your left / right knee so your foot is flat on the floor. 2. Slowly slide your left / right heel back toward your buttocks until you feel a gentle stretch in the front of your knee or thigh. 3. Hold this position for __________ seconds. 4. Slowly slide your left / right heel back to the starting position. Repeat __________ times. Complete this exercise __________ times a day. Exercise C: Quadriceps, Prone  1. Lie on your abdomen on a firm surface, such as a bed or padded floor. 2. Bend your left / right knee and hold your ankle. If you cannot reach your ankle or pant leg, loop a belt around your foot and grab the belt instead. 3. Gently pull your heel toward your buttocks. Your knee should not slide out to the side. You should feel a stretch in the front of your thigh and knee. 4. Hold this position for __________ seconds. Repeat __________ times. Complete this stretch __________ times a day. Exercise D: Hamstring, Supine 1. Lie on your back. 2. Loop a belt or towel over the ball of your left / right foot. The ball  of your foot is on the walking surface, right under your toes. 3. Straighten your left / right knee and slowly pull on the belt to raise your leg until you feel a gentle stretch behind your knee. ? Do not let your left / right knee bend while you do this. ? Keep your other leg flat on the floor. 4. Hold this position for __________ seconds. Repeat __________ times. Complete this stretch __________ times a day. STRENGTHENING EXERCISES These exercises build strength and endurance in your knee. Endurance is the ability to use your muscles for a long time, even after they get tired. Exercise E: Quadriceps, Isometric  1. Lie on your back with your left / right leg extended and your other knee bent. Put a rolled towel or small pillow under your knee if told by your health care provider. 2. Slowly tense the muscles in the front of your left / right thigh. You should see your   kneecap slide up toward your hip or see increased dimpling just above the knee. This motion will push the back of the knee toward the floor. 3. For __________ seconds, keep the muscle as tight as you can without increasing your pain. 4. Relax the muscles slowly and completely. Repeat __________ times. Complete this exercise __________ times a day. Exercise F: Straight Leg Raises - Quadriceps 1. Lie on your back with your left / right leg extended and your other knee bent. 2. Tense the muscles in the front of your left / right thigh. You should see your kneecap slide up or see increased dimpling just above the knee. Your thigh may even shake a bit. 3. Keep these muscles tight as you raise your leg 4-6 inches (10-15 cm) off the floor. Do not let your knee bend. 4. Hold this position for __________ seconds. 5. Keep these muscles tense as you lower your leg. 6. Relax your muscles slowly and completely after each repetition. Repeat __________ times. Complete this exercise __________ times a day. Exercise G: Hamstring,  Isometric 1. Lie on your back on a firm surface. 2. Bend your left / right knee approximately __________ degrees. 3. Dig your left / right heel into the surface as if you are trying to pull it toward your buttocks. Tighten the muscles in the back of your thighs to dig as hard as you can without increasing any pain. 4. Hold this position for __________ seconds. 5. Release the tension gradually and allow your muscles to relax completely for __________ seconds after each repetition. Repeat __________ times. Complete this exercise __________ times a day. Exercise H: Hamstring Curls  If told by your health care provider, do this exercise while wearing ankle weights. Begin with __________ weights. Then increase the weight by 1 lb (0.5 kg) increments. Do not wear ankle weights that are more than __________. 1. Lie on your abdomen with your legs straight. 2. Bend your left / right knee as far as you can without feeling pain. Keep your hips flat against the floor. 3. Hold this position for __________ seconds. 4. Slowly lower your leg to the starting position.  Repeat __________ times. Complete this exercise __________ times a day. Exercise I: Squats (Quadriceps) 1. Stand in front of a table, with your feet and knees pointing straight ahead. You may rest your hands on the table for balance but not for support. 2. Slowly bend your knees and lower your hips like you are going to sit in a chair. ? Keep your weight over your heels, not over your toes. ? Keep your lower legs upright so they are parallel with the table legs. ? Do not let your hips go lower than your knees. ? Do not bend lower than told by your health care provider. ? If your knee pain increases, do not bend as low. 3. Hold the squat position for __________ seconds. 4. Slowly push with your legs to return to standing. Do not use your hands to pull yourself to standing. Repeat __________ times. Complete this exercise __________ times a  day. Exercise J: Wall Slides (Quadriceps)  1. Lean your back against a smooth wall or door while you walk your feet out 18-24 inches (46-61 cm) from it. 2. Place your feet hip-width apart. 3. Slowly slide down the wall or door until your knees bend __________ degrees. Keep your knees over your heels, not over your toes. Keep your knees in line with your hips. 4. Hold for __________ seconds. Repeat   __________ times. Complete this exercise __________ times a day. Exercise K: Straight Leg Raises - Hip Abductors 1. Lie on your side with your left / right leg in the top position. Lie so your head, shoulder, knee, and hip line up. You may bend your bottom knee to help you keep your balance. 2. Roll your hips slightly forward so your hips are stacked directly over each other and your left / right knee is facing forward. 3. Leading with your heel, lift your top leg 4-6 inches (10-15 cm). You should feel the muscles in your outer hip lifting. ? Do not let your foot drift forward. ? Do not let your knee roll toward the ceiling. 4. Hold this position for __________ seconds. 5. Slowly return your leg to the starting position. 6. Let your muscles relax completely after each repetition. Repeat __________ times. Complete this exercise __________ times a day. Exercise L: Straight Leg Raises - Hip Extensors 1. Lie on your abdomen on a firm surface. You can put a pillow under your hips if that is more comfortable. 2. Tense the muscles in your buttocks and lift your left / right leg about 4-6 inches (10-15 cm). Keep your knee straight as you lift your leg. 3. Hold this position for __________ seconds. 4. Slowly lower your leg to the starting position. 5. Let your leg relax completely after each repetition. Repeat __________ times. Complete this exercise __________ times a day. This information is not intended to replace advice given to you by your health care provider. Make sure you discuss any questions you  have with your health care provider. Document Released: 08/30/2005 Document Revised: 07/10/2016 Document Reviewed: 08/22/2015 Elsevier Interactive Patient Education  2018 Else[employer] Inc.  

## 2017-11-28 ENCOUNTER — Encounter: Payer: Self-pay | Admitting: Orthopedic Surgery

## 2017-11-28 ENCOUNTER — Ambulatory Visit: Payer: Medicare HMO | Admitting: Orthopedic Surgery

## 2017-11-28 VITALS — BP 141/81 | HR 73 | Ht 67.0 in | Wt 204.0 lb

## 2017-11-28 DIAGNOSIS — G8929 Other chronic pain: Secondary | ICD-10-CM

## 2017-11-28 DIAGNOSIS — S83241D Other tear of medial meniscus, current injury, right knee, subsequent encounter: Secondary | ICD-10-CM | POA: Diagnosis not present

## 2017-11-28 DIAGNOSIS — M25561 Pain in right knee: Secondary | ICD-10-CM

## 2017-11-28 NOTE — Patient Instructions (Signed)
Meniscus Injury, Arthroscopy Arthroscopy is a surgical procedure that involves the use of a small scope that has a camera and surgical instruments on the end (arthroscope). An arthroscope can be used to repair your meniscus injury.  LET YOUR HEALTH CARE PROVIDER KNOW ABOUT:  Any allergies you have.  All medicines you are taking, including vitamins, herbs, eyedrops, creams, and over-the-counter medicines.  Any recent colds or infections you have had or currently have.  Previous problems you or members of your family have had with the use of anesthetics.  Any blood disorders or blood clotting problems you have.  Previous surgeries you have had.  Medical conditions you have. RISKS AND COMPLICATIONS Generally, this is a safe procedure. However, as with any procedure, problems can occur. Possible problems include:  Damage to nerves or blood vessels.  Excess bleeding.  Blood clots.  Infection. BEFORE THE PROCEDURE  Do not eat or drink for 6-8 hours before the procedure.  Take medicines as directed by your surgeon. Ask your surgeon about changing or stopping your regular medicines.  You may have lab tests the morning of surgery. PROCEDURE  You will be given one of the following:   A medicine that numbs the area (local anesthesia).  A medicine that makes you go to sleep (general anesthesia).  A medicine injected into your spine that numbs your body below the waist (spinal anesthesia). Most often, several small cuts (incisions) are made in the knee. The arthroscope and instruments go into the incisions to repair the damage. The torn portion of the meniscus is removed.   AFTER THE PROCEDURE  You will be taken to the recovery area where your progress will be monitored. When you are awake, stable, and taking fluids without complications, you will be allowed to go home. This is usually the same day. A torn or stretched ligament (ligament sprain) may take 6-8 weeks to heal.   It  takes about the 4-6 WEEKS if your surgeon removed a torn meniscus.  A repaired meniscus may require 6-12 weeks of recovery time.  A torn ligament needing reconstructive surgery may take 6-12 months to heal fully.   This information is not intended to replace advice given to you by your health care provider. Make sure you discuss any questions you have with your health care provider. You have decided to proceed with operative arthroscopy of the knee. You have decided not to continue with nonoperative measures such as but not limited to oral medication, weight loss, activity modification, physical therapy, bracing, or injection.  We will perform operative arthroscopy of the knee. Some of the risks associated with arthroscopic surgery of the knee include but are not limited to Bleeding Infection Swelling Stiffness Blood clot Pain  If you're not comfortable with these risks and would like to continue with nonoperative treatment please let Dr. Belvin Gauss know prior to your surgery.   Document Released: 10/13/2000 Document Revised: 10/21/2013 Document Reviewed: 03/14/2013 Elsevier Interactive Patient Education 2016 Else[employer] Inc. You have decided to proceed with operative arthroscopy of the knee. You have decided not to continue with nonoperative measures such as but not limited to oral medication, weight loss, activity modification, physical therapy, bracing, or injection.  We will perform operative arthroscopy of the knee. Some of the risks associated with arthroscopic surgery of the knee include but are not limited to Bleeding Infection Swelling Stiffness Blood clot Pain  If you're not comfortable with these risks and would like to continue with nonoperative treatment please let Dr. Cristal Howatt   know prior to your surgery. 

## 2017-11-28 NOTE — Progress Notes (Signed)
Progress Note   Patient ID: Trevor Berg, male   DOB: September 18, 1951, 67 y.o.   MRN: [id number]  Chief Complaint  Patient presents with  . Knee Pain    surgical consult per Dr. Luna Glasgow right knee    67 year old male presents for evaluation for possible right knee surgery  He complains of a 62-month history of moderate dull constant right medial knee pain associated with giving way of the right knee  After a course of nonoperative treatment MRI showed a torn medial meniscus and extensive arthritis throughout all 3 compartments     Review of Systems  Constitutional: Negative.   Respiratory: Negative.   Cardiovascular:       NTG for angina rare  Musculoskeletal: Negative for back pain.  Neurological: Negative.   Psychiatric/Behavioral: Positive for memory loss.   Current Meds  Medication Sig  . albuterol (PROVENTIL HFA;VENTOLIN HFA) 108 (90 BASE) MCG/ACT inhaler Inhale 2 puffs into the lungs every 6 (six) hours as needed for shortness of breath.   Marland Kitchen amLODipine (NORVASC) 2.5 MG tablet Take 2.5 mg by mouth daily.  Jearl Klinefelter ELLIPTA 62.5-25 MCG/INH AEPB Inhale 1 puff into the lungs daily.   Marland Kitchen aspirin EC 81 MG tablet Take 81 mg by mouth daily.  Marland Kitchen atenolol (TENORMIN) 50 MG tablet TAKE ONE TABLET BY MOUTH ONCE DAILY IN THE MORNING  . atorvastatin (LIPITOR) 20 MG tablet Take 20 mg by mouth every morning.  Marland Kitchen DEXILANT 60 MG capsule Take 60 mg by mouth daily.   Marland Kitchen dextromethorphan-guaiFENesin (MUCINEX DM) 30-600 MG 12hr tablet Take 1 tablet by mouth every 12 (twelve) hours.   . hydrochlorothiazide (MICROZIDE) 12.5 MG capsule TAKE 1 CAPSULE BY MOUTH ONCE A DAY FOR HTN/SWELLING  . ibuprofen (ADVIL,MOTRIN) 800 MG tablet Take 800 mg by mouth 3 (three) times daily as needed for moderate pain.   Marland Kitchen [name] 290 MCG CAPS capsule Take 1 capsule (290 mcg total) by mouth daily.  Marland Kitchen lisinopril (PRINIVIL,ZESTRIL) 40 MG tablet Take 1 tablet (40 mg total) by mouth daily.  Marland Kitchen LORazepam (ATIVAN) 1 MG tablet Take 1  mg by mouth 3 (three) times daily as needed for anxiety.   . Naphazoline HCl (CLEAR EYES OP) Apply 1 drop to eye daily as needed (dry eyes).  . nitroGLYCERIN (NITROSTAT) 0.4 MG SL tablet Place 0.4 mg under the tongue every 5 (five) minutes as needed for chest pain. Chest Pain  . pantoprazole (PROTONIX) 40 MG tablet Take 40 mg by mouth daily.  . polyethylene glycol powder (GLYCOLAX/MIRALAX) powder USE 1 CAPFUL DAILY AS NEEDED FOR CONSTIPATION (Patient taking differently: USE 1 CAPFUL DAILY)  . sertraline (ZOLOFT) 50 MG tablet Take 50 mg by mouth daily.  . simvastatin (ZOCOR) 20 MG tablet Take 20 mg by mouth at bedtime.  . traZODone (DESYREL) 50 MG tablet Take 50 mg by mouth at bedtime.  . vitamin B-12 (CYANOCOBALAMIN) 1000 MCG tablet Take 2,000 mcg by mouth daily.     Past Medical History:  Diagnosis Date  . Anginal pain ([place])   . Anxiety   . Asthma   . Chest pain 2005   noncritical coronary disease in 2005: 40% distal RCA, 30% CX, 50% OM 2, EF-60%,  . COPD (chronic obstructive pulmonary disease) ([facility])    Chronic bronchitis; 100-pack-year cigarette consumption  . Gastroesophageal reflux disease    possible small Schatzki's ring; esophageal dilatation in 2005  . H. pylori infection 2014   treated with prevpac  . Hyperlipidemia   . Hypertension   .  Overweight(278.02)   . Tobacco abuse    100 pack years     Allergies  Allergen Reactions  . Chantix [Varenicline]     Nightmares and personality changes.  . Tomato Rash    BP (!) 141/81   Pulse 73   Ht 5\' 7"  (1.702 m)   Wt 204 lb (92.5 kg)   BMI 31.95 kg/m    Physical Exam  Constitutional: He is oriented to person, place, and time. He appears well-developed and well-nourished.  Vital signs have been reviewed and are stable. Gen. appearance the patient is well-developed and well-nourished with normal grooming and hygiene.   Musculoskeletal:       Right knee: He exhibits no effusion.       Left knee: He exhibits no effusion.   Neurological: He is alert and oriented to person, place, and time. He has normal strength. He displays atrophy. He displays no tremor. No sensory deficit. He exhibits normal muscle tone. Gait abnormal.  Skin: Skin is warm and dry. No erythema.  Psychiatric: He has a normal mood and affect.  Vitals reviewed.   Right Knee Exam   Tenderness  The patient is experiencing tenderness in the medial joint line.  Range of Motion  Extension: normal  Right knee flexion: 125.   Tests  Drawer:  Anterior - negative    Posterior - negative  Other  Erythema: absent Scars: absent Sensation: normal Pulse: present Effusion: no effusion present   Left Knee Exam   Muscle Strength  The patient has normal left knee strength.  Tenderness  The patient is experiencing tenderness in the medial joint line.  Range of Motion  The patient has normal left knee ROM.  Tests  Drawer:  Anterior - negative       Other  Erythema: absent Scars: absent Sensation: normal Pulse: present Swelling: none Effusion: no effusion present       Medical decision-making  Imaging:  See dictated reports which are included by reference.  The MRI of the knee shows 3 compartment arthrosis moderate to mild throughout the knee also has a torn medial meniscus with macerated meniscal fragments  X-ray shows joint space narrowing moderate medial compartment     Encounter Diagnoses  Name Primary?  . Chronic pain of right knee Yes  . Tear of medial meniscus of right knee, current, subsequent encounter    Patient is agreeable to right knee arthroscopy partial medial meniscectomy versus total knee arthroplasty after both options were discussed  The procedure has been fully reviewed with the patient; The risks and benefits of surgery have been discussed and explained and understood. Alternative treatment has also been reviewed, questions were encouraged and answered. The postoperative plan is also been  reviewed. No orders of the defined types were placed in this encounter.    [name] Abbott, MD 11/28/2017 1:49 PM

## 2017-11-30 ENCOUNTER — Encounter (HOSPITAL_COMMUNITY): Payer: Self-pay

## 2017-11-30 NOTE — Patient Instructions (Signed)
Your procedure is scheduled on: 12/06/2017  Report to [facility] at    10:20 AM.  Call this number if you have problems the morning of surgery: [phone]   Remember:   Do not drink or eat food:After Midnight.  :  Take these medicines the morning of surgery with A SIP OF WATER: Amlodipine, Atenolol, Dexilant, lisinopril, Zoloft, and ativan if needed.  Use inhalers if needed   Do not wear jewelry, make-up or nail polish.  Do not wear lotions, powders, or perfumes. You may wear deodorant.  Do not shave 48 hours prior to surgery. Men may shave face and neck.  Do not bring valuables to the hospital.  Contacts, dentures or bridgework may not be worn into surgery.  Leave suitcase in the car. After surgery it may be brought to your room.  For patients admitted to the hospital, checkout time is 11:00 AM the day of discharge.   Patients discharged the day of surgery will not be allowed to drive home.    Special Instructions: Shower using CHG night before surgery and shower the day of surgery use CHG.  Use special wash - you have one bottle of CHG for all showers.  You should use approximately 1/2 of the bottle for each shower.  Arthroscopic Knee Ligament Repair, Care After This sheet gives you information about how to care for yourself after your procedure. Your health care provider may also give you more specific instructions. If you have problems or questions, contact your health care provider. What can I expect after the procedure? After the procedure, it is common to have:  Pain in your knee.  Bruising and swelling on your knee, calf, and ankle for 3-4 days.  Fatigue.  Follow these instructions at home: If you have a brace or immobilizer:  Wear the brace or immobilizer as told by your health care provider. Remove it only as told by your health care provider.  Loosen the splint or immobilizer if your toes tingle, become numb, or turn cold and blue.  Keep the brace or immobilizer  clean. Bathing  Do not take baths, swim, or use a hot tub until your health care provider approves. Ask your health care provider if you can take showers.  Keep your bandage (dressing) dry until your health care provider says that it can be removed. Cover it and your brace or immobilizer with a watertight covering when you take a shower. Incision care  Follow instructions from your health care provider about how to take care of your incision. Make sure you: ? Wash your hands with soap and water before you change your bandage (dressing). If soap and water are not available, use hand sanitizer. ? Change your dressing as told by your health care provider. ? Leave stitches (sutures), skin glue, or adhesive strips in place. These skin closures may need to stay in place for 2 weeks or longer. If adhesive strip edges start to loosen and curl up, you may trim the loose edges. Do not remove adhesive strips completely unless your health care provider tells you to do that.  Check your incision area every day for signs of infection. Check for: ? More redness, swelling, or pain. ? More fluid or blood. ? Warmth. ? Pus or a bad smell. Managing pain, stiffness, and swelling  If directed, put ice on the affected area. ? If you have a removable brace or immobilizer, remove it as told by your health care provider. ? Put ice in  a plastic bag. ? Place a towel between your skin and the bag or between your brace or immobilizer and the bag. ? Leave the ice on for 20 minutes, 2-3 times a day.  Move your toes often to avoid stiffness and to lessen swelling.  Raise (elevate) the injured area above the level of your heart while you are sitting or lying down. Driving  Do not drive until your health care provider approves. If you have a brace or immobilizer on your leg, ask your health care provider when it is safe for you to drive.  Do not drive or use heavy machinery while taking prescription pain  medicine. Activity  Rest as directed. Ask your health care provider what activities are safe for you.  Do physical therapy exercises as told by your health care provider. Physical therapy will help you regain strength and motion in your knee.  Follow instructions from your health care provider about: ? When you may start motion exercises. ? When you may start riding a stationary bike and doing other low-impact activities. ? When you may start to jog and do other high-impact activities. Safety  Do not use the injured limb to support your body weight until your health care provider says that you can. Use crutches as told by your health care provider. General instructions  Do not use any products that contain nicotine or tobacco, such as cigarettes and e-cigarettes. These can delay bone healing. If you need help quitting, ask your health care provider.  To prevent or treat constipation while you are taking prescription pain medicine, your health care provider may recommend that you: ? Drink enough fluid to keep your urine clear or pale yellow. ? Take over-the-counter or prescription medicines. ? Eat foods that are high in fiber, such as fresh fruits and vegetables, whole grains, and beans. ? Limit foods that are high in fat and processed sugars, such as fried and sweet foods.  Take over-the-counter and prescription medicines only as told by your health care provider.  Keep all follow-up visits as told by your health care provider. This is important. Contact a health care provider if:  You have more redness, swelling, or pain around an incision.  You have more fluid or blood coming from an incision.  Your incision feels warm to the touch.  You have a fever.  You have pain or swelling in your knee, and it gets worse.  You have pain that does not get better with medicine. Get help right away if:  You have trouble breathing.  You have pus or a bad smell coming from an  incision.  You have numbness and tingling near the knee joint. Summary  After the procedure, it is common to have knee pain with bruising and swelling on your knee, calf, and ankle.  Icing your knee and raising your leg above the level of your heart will help control the pain and the swelling.  Do physical therapy exercises as told by your health care provider. Physical therapy will help you regain strength and motion in your knee. This information is not intended to replace advice given to you by your health care provider. Make sure you discuss any questions you have with your health care provider. Document Released: 08/06/2013 Document Revised: 10/10/2016 Document Reviewed: 10/10/2016 Elsevier Interactive Patient Education  2017 [place] Anesthesia, Adult, Care After These instructions provide you with information about caring for yourself after your procedure. Your health care provider may also give you  more specific instructions. Your treatment has been planned according to current medical practices, but problems sometimes occur. Call your health care provider if you have any problems or questions after your procedure. What can I expect after the procedure? After the procedure, it is common to have:  Vomiting.  A sore throat.  Mental slowness.  It is common to feel:  Nauseous.  Cold or shivery.  Sleepy.  Tired.  Sore or achy, even in parts of your body where you did not have surgery.  Follow these instructions at home: For at least 24 hours after the procedure:  Do not: ? Participate in activities where you could fall or become injured. ? Drive. ? Use heavy machinery. ? Drink alcohol. ? Take sleeping pills or medicines that cause drowsiness. ? Make important decisions or sign legal documents. ? Take care of children on your own.  Rest. Eating and drinking  If you vomit, drink water, juice, or soup when you can drink without vomiting.  Drink  enough fluid to keep your urine clear or pale yellow.  Make sure you have little or no nausea before eating solid foods.  Follow the diet recommended by your health care provider. General instructions  Have a responsible adult stay with you until you are awake and alert.  Return to your normal activities as told by your health care provider. Ask your health care provider what activities are safe for you.  Take over-the-counter and prescription medicines only as told by your health care provider.  If you smoke, do not smoke without supervision.  Keep all follow-up visits as told by your health care provider. This is important. Contact a health care provider if:  You continue to have nausea or vomiting at home, and medicines are not helpful.  You cannot drink fluids or start eating again.  You cannot urinate after 8-12 hours.  You develop a skin rash.  You have fever.  You have increasing redness at the site of your procedure. Get help right away if:  You have difficulty breathing.  You have chest pain.  You have unexpected bleeding.  You feel that you are having a life-threatening or urgent problem. This information is not intended to replace advice given to you by your health care provider. Make sure you discuss any questions you have with your health care provider. Document Released: 01/22/2001 Document Revised: 03/20/2016 Document Reviewed: 09/30/2015 Elsevier Interactive Patient Education  [employer].

## 2017-12-03 ENCOUNTER — Encounter (HOSPITAL_COMMUNITY): Payer: Self-pay

## 2017-12-03 ENCOUNTER — Encounter (HOSPITAL_COMMUNITY)
Admission: RE | Admit: 2017-12-03 | Discharge: 2017-12-03 | Disposition: A | Discharge status: Home / Self Care | Payer: Medicare HMO | Source: Ambulatory Visit | Attending: Orthopedic Surgery | Admitting: Orthopedic Surgery

## 2017-12-03 ENCOUNTER — Other Ambulatory Visit: Payer: Self-pay

## 2017-12-03 ENCOUNTER — Telehealth: Payer: Self-pay | Admitting: Orthopedic Surgery

## 2017-12-03 DIAGNOSIS — J449 Chronic obstructive pulmonary disease, unspecified: Secondary | ICD-10-CM | POA: Diagnosis not present

## 2017-12-03 DIAGNOSIS — K219 Gastro-esophageal reflux disease without esophagitis: Secondary | ICD-10-CM | POA: Diagnosis not present

## 2017-12-03 DIAGNOSIS — F17210 Nicotine dependence, cigarettes, uncomplicated: Secondary | ICD-10-CM | POA: Diagnosis not present

## 2017-12-03 DIAGNOSIS — I10 Essential (primary) hypertension: Secondary | ICD-10-CM | POA: Diagnosis not present

## 2017-12-03 DIAGNOSIS — I2510 Atherosclerotic heart disease of native coronary artery without angina pectoris: Secondary | ICD-10-CM | POA: Diagnosis not present

## 2017-12-03 DIAGNOSIS — S83241D Other tear of medial meniscus, current injury, right knee, subsequent encounter: Secondary | ICD-10-CM

## 2017-12-03 DIAGNOSIS — X58XXXA Exposure to other specified factors, initial encounter: Secondary | ICD-10-CM | POA: Diagnosis not present

## 2017-12-03 DIAGNOSIS — E785 Hyperlipidemia, unspecified: Secondary | ICD-10-CM | POA: Diagnosis not present

## 2017-12-03 DIAGNOSIS — Z7982 Long term (current) use of aspirin: Secondary | ICD-10-CM | POA: Diagnosis not present

## 2017-12-03 DIAGNOSIS — F419 Anxiety disorder, unspecified: Secondary | ICD-10-CM | POA: Diagnosis not present

## 2017-12-03 DIAGNOSIS — Z79899 Other long term (current) drug therapy: Secondary | ICD-10-CM | POA: Diagnosis not present

## 2017-12-03 DIAGNOSIS — S83241A Other tear of medial meniscus, current injury, right knee, initial encounter: Secondary | ICD-10-CM | POA: Diagnosis not present

## 2017-12-03 DIAGNOSIS — M1711 Unilateral primary osteoarthritis, right knee: Secondary | ICD-10-CM | POA: Diagnosis not present

## 2017-12-03 LAB — CBC WITH DIFFERENTIAL/PLATELET
Basophils Absolute: 0.1 10*3/uL (ref 0.0–0.1)
Basophils Relative: 1 %
Eosinophils Absolute: 0.1 10*3/uL (ref 0.0–0.7)
Eosinophils Relative: 2 %
HCT: 42.4 % (ref 39.0–52.0)
Hemoglobin: 13.9 g/dL (ref 13.0–17.0)
Lymphocytes Relative: 34 %
Lymphs Abs: 2.2 10*3/uL (ref 0.7–4.0)
MCH: 31.6 pg (ref 26.0–34.0)
MCHC: 32.8 g/dL (ref 30.0–36.0)
MCV: 96.4 fL (ref 78.0–100.0)
Monocytes Absolute: 0.6 10*3/uL (ref 0.1–1.0)
Monocytes Relative: 10 %
Neutro Abs: 3.4 10*3/uL (ref 1.7–7.7)
Neutrophils Relative %: 53 %
Platelets: 187 10*3/uL (ref 150–400)
RBC: 4.4 MIL/uL (ref 4.22–5.81)
RDW: 13.1 % (ref 11.5–15.5)
WBC: 6.4 10*3/uL (ref 4.0–10.5)

## 2017-12-03 LAB — COMPREHENSIVE METABOLIC PANEL
ALT: 19 U/L (ref 17–63)
AST: 22 U/L (ref 15–41)
Albumin: 3.9 g/dL (ref 3.5–5.0)
Alkaline Phosphatase: 62 U/L (ref 38–126)
Anion gap: 12 (ref 5–15)
BUN: 17 mg/dL (ref 6–20)
CO2: 23 mmol/L (ref 22–32)
Calcium: 9.1 mg/dL (ref 8.9–10.3)
Chloride: 103 mmol/L (ref 101–111)
Creatinine, Ser: 1.11 mg/dL (ref 0.61–1.24)
GFR calc Af Amer: >60 mL/min (ref >60)
GFR calc non Af Amer: >60 mL/min (ref >60)
Glucose, Bld: 120 mg/dL — ABNORMAL HIGH (ref 65–99)
Potassium: 4.1 mmol/L (ref 3.5–5.1)
Sodium: 138 mmol/L (ref 135–145)
Total Bilirubin: 0.4 mg/dL (ref 0.3–1.2)
Total Protein: 6.8 g/dL (ref 6.5–8.1)

## 2017-12-03 LAB — SURGICAL PCR SCREEN
MRSA, PCR: NEGATIVE
Staphylococcus aureus: NEGATIVE

## 2017-12-03 NOTE — Telephone Encounter (Signed)
Patient requests prescription for walker be sent to [facility].  He is having surgery on Thursday.  Please let patient and his wife know when this is done.  Thanks

## 2017-12-04 NOTE — Telephone Encounter (Signed)
I called patient, I faxed this yesterday.

## 2017-12-05 NOTE — H&P (Signed)
Outpatient admission history   Patient ID: Trevor Berg, male   DOB: 11-11-1950, 67 y.o.   MRN: [id number]   Chief Complaint  Patient presents with  . Knee Pain      surgical consult per Dr. Luna Glasgow right knee      67 year old male presents for evaluation for possible right knee surgery   He complains of a 15-month history of moderate dull constant right medial knee pain associated with giving way of the right knee   After a course of nonoperative treatment MRI showed a torn medial meniscus and extensive arthritis throughout all 3 compartments         Review of Systems  Constitutional: Negative.   Respiratory: Negative.   Cardiovascular:       NTG for angina rare  Musculoskeletal: Negative for back pain.  Neurological: Negative.   Psychiatric/Behavioral: Positive for memory loss.    Active Medications      Current Meds  Medication Sig  . albuterol (PROVENTIL HFA;VENTOLIN HFA) 108 (90 BASE) MCG/ACT inhaler Inhale 2 puffs into the lungs every 6 (six) hours as needed for shortness of breath.   Marland Kitchen amLODipine (NORVASC) 2.5 MG tablet Take 2.5 mg by mouth daily.  Jearl Klinefelter ELLIPTA 62.5-25 MCG/INH AEPB Inhale 1 puff into the lungs daily.   Marland Kitchen aspirin EC 81 MG tablet Take 81 mg by mouth daily.  Marland Kitchen atenolol (TENORMIN) 50 MG tablet TAKE ONE TABLET BY MOUTH ONCE DAILY IN THE MORNING  . atorvastatin (LIPITOR) 20 MG tablet Take 20 mg by mouth every morning.  Marland Kitchen DEXILANT 60 MG capsule Take 60 mg by mouth daily.   Marland Kitchen dextromethorphan-guaiFENesin (MUCINEX DM) 30-600 MG 12hr tablet Take 1 tablet by mouth every 12 (twelve) hours.   . hydrochlorothiazide (MICROZIDE) 12.5 MG capsule TAKE 1 CAPSULE BY MOUTH ONCE A DAY FOR HTN/SWELLING  . ibuprofen (ADVIL,MOTRIN) 800 MG tablet Take 800 mg by mouth 3 (three) times daily as needed for moderate pain.   Marland Kitchen [name] 290 MCG CAPS capsule Take 1 capsule (290 mcg total) by mouth daily.  Marland Kitchen lisinopril (PRINIVIL,ZESTRIL) 40 MG tablet Take 1 tablet (40 mg total) by  mouth daily.  Marland Kitchen LORazepam (ATIVAN) 1 MG tablet Take 1 mg by mouth 3 (three) times daily as needed for anxiety.   . Naphazoline HCl (CLEAR EYES OP) Apply 1 drop to eye daily as needed (dry eyes).  . nitroGLYCERIN (NITROSTAT) 0.4 MG SL tablet Place 0.4 mg under the tongue every 5 (five) minutes as needed for chest pain. Chest Pain  . pantoprazole (PROTONIX) 40 MG tablet Take 40 mg by mouth daily.  . polyethylene glycol powder (GLYCOLAX/MIRALAX) powder USE 1 CAPFUL DAILY AS NEEDED FOR CONSTIPATION (Patient taking differently: USE 1 CAPFUL DAILY)  . sertraline (ZOLOFT) 50 MG tablet Take 50 mg by mouth daily.  . simvastatin (ZOCOR) 20 MG tablet Take 20 mg by mouth at bedtime.  . traZODone (DESYREL) 50 MG tablet Take 50 mg by mouth at bedtime.  . vitamin B-12 (CYANOCOBALAMIN) 1000 MCG tablet Take 2,000 mcg by mouth daily.         Social History   Tobacco Use  . Smoking status: Current Every Day Smoker    Packs/day: 1.00    Years: 50.00    Pack years: 50.00    Types: Cigarettes  . Smokeless tobacco: Never Used  . Tobacco comment: 1 pack per day as of 08/2012  Substance Use Topics  . Alcohol use: No    Alcohol/week: 0.0 oz  Comment: occasionally, usually holidays but sometimes on weekends, prior heavy use  . Drug use: No   Family History  Problem Relation Age of Onset  . Heart attack Mother        deceased, age 65s  . Ulcers Brother   . Diabetes Brother   . Hypertension Unknown   . Colon cancer Neg Hx    Past Surgical History:  Procedure Laterality Date  . CERVICAL SPINE SURGERY     C5-6 and C6-7: Relief of spinal stenosis with corpectomy, foraminotomy and fusion  . COLONOSCOPY  10/19/2004   RMR:   Friable anal canal, likely source of patient's hematochezia/  Otherwise normal rectum, normal colon.  . COLONOSCOPY N/A 09/09/2015   Procedure: COLONOSCOPY;  Surgeon: Daneil Dolin, MD;  Location: [facility];  Service: Endoscopy;  Laterality: N/A;  1015  . ESOPHAGEAL DILATION  N/A 09/09/2015   Procedure: ESOPHAGEAL DILATION;  Surgeon: Daneil Dolin, MD;  Location: [facility];  Service: Endoscopy;  Laterality: N/A;  . ESOPHAGOGASTRODUODENOSCOPY  09/27/2004   RMR:   A couple of tiny, distal esophageal erosions, otherwise normal esophagus aside from a Schatzki ring status post Maloney dilation as described above/ Small hiatal hernia, otherwise normal stomach, normal D1-D2  . ESOPHAGOGASTRODUODENOSCOPY N/A 09/09/2015   Procedure: ESOPHAGOGASTRODUODENOSCOPY (EGD);  Surgeon: Daneil Dolin, MD;  [facility]: [facility];  Service: Endoscopy;  Laterality: N/A;  . ESOPHAGOGASTRODUODENOSCOPY N/A 04/19/2017   Procedure: ESOPHAGOGASTRODUODENOSCOPY (EGD);  Surgeon: Daneil Dolin, MD;  [facility]: [facility];  Service: Endoscopy;  Laterality: N/A;  730   . ESOPHAGOGASTRODUODENOSCOPY (EGD) WITH ESOPHAGEAL DILATION N/A 03/19/2013   KXF:GHWEXH esophagus s/p dilation/Abnormal stomach of uncertain clinical significance-s/p bx positive for H. pylori, status post Prevpac treatment  . MALONEY DILATION N/A 04/19/2017   Procedure: Venia Minks DILATION;  Surgeon: Daneil Dolin, MD;  Location: [facility];  Service: Endoscopy;  Laterality: N/A;  . ORIF ANKLE FRACTURE     right  . ORIF TIBIA FRACTURE     Left  . TONSILLECTOMY          Past Medical History:  Diagnosis Date  . Anginal pain ([place])    . Anxiety    . Asthma    . Chest pain 2005    noncritical coronary disease in 2005: 40% distal RCA, 30% CX, 50% OM 2, EF-60%,  . COPD (chronic obstructive pulmonary disease) ([facility])      Chronic bronchitis; 100-pack-year cigarette consumption  . Gastroesophageal reflux disease      possible small Schatzki's ring; esophageal dilatation in 2005  . H. pylori infection 2014    treated with prevpac  . Hyperlipidemia    . Hypertension    . Overweight(278.02)    . Tobacco abuse      100 pack years             Allergies  Allergen Reactions  . Chantix [Varenicline]        Nightmares  and personality changes.  . Tomato Rash      BP (!) 141/81   Pulse 73   Ht 5\' 7"  (1.702 m)   Wt 204 lb (92.5 kg)   BMI 31.95 kg/m      Physical Exam  Constitutional: He is oriented to person, place, and time. He appears well-developed and well-nourished.  Vital signs have been reviewed and are stable. Gen. appearance the patient is well-developed and well-nourished with normal grooming and hygiene.   Musculoskeletal:  Right knee: He exhibits no effusion.       Left knee: He exhibits no effusion.  Neurological: He is alert and oriented to person, place, and time. He has normal strength. He displays atrophy. He displays no tremor. No sensory deficit. He exhibits normal muscle tone. Gait abnormal.  Skin: Skin is warm and dry. No erythema.  Psychiatric: He has a normal mood and affect.  Vitals reviewed.     Right Knee Exam    Tenderness  The patient is experiencing tenderness in the medial joint line.   Range of Motion  Extension: normal  Right knee flexion: 125.    Tests  Drawer:  Anterior - negative    Posterior - negative   Other  Erythema: absent Scars: absent Sensation: normal Pulse: present Effusion: no effusion present     Left Knee Exam    Muscle Strength  The patient has normal left knee strength.   Tenderness  The patient is experiencing tenderness in the medial joint line.   Range of Motion  The patient has normal left knee ROM.   Tests  Drawer:  Anterior - negative        Other  Erythema: absent Scars: absent Sensation: normal Pulse: present Swelling: none Effusion: no effusion present             Medical decision-making   Imaging:  See dictated reports which are included by reference.  The MRI of the knee shows 3 compartment arthrosis moderate to mild throughout the knee also has a torn medial meniscus with macerated meniscal fragments   X-ray shows joint space narrowing moderate medial compartment             Encounter  Diagnoses  Name Primary?  . Chronic pain of right knee Yes  . Tear of medial meniscus of right knee, current, subsequent encounter      Patient is agreeable to right knee arthroscopy partial medial meniscectomy versus total knee arthroplasty after both options were discussed  Planned procedure right knee arthroscopy partial medial meniscectomy   The procedure has been fully reviewed with the patient; The risks and benefits of surgery have been discussed and explained and understood. Alternative treatment has also been reviewed, questions were encouraged and answered. The postoperative plan is also been reviewed. No orders of the defined types were placed in this encounter.

## 2017-12-06 ENCOUNTER — Ambulatory Visit (HOSPITAL_COMMUNITY)
Admission: RE | Admit: 2017-12-06 | Discharge: 2017-12-06 | Disposition: A | Discharge status: Home / Self Care | Payer: Medicare HMO | Source: Ambulatory Visit | Attending: Orthopedic Surgery | Admitting: Orthopedic Surgery

## 2017-12-06 ENCOUNTER — Encounter (HOSPITAL_COMMUNITY): Payer: Self-pay

## 2017-12-06 ENCOUNTER — Ambulatory Visit (HOSPITAL_COMMUNITY): Payer: Medicare HMO | Admitting: Certified Registered Nurse Anesthetist

## 2017-12-06 ENCOUNTER — Other Ambulatory Visit: Payer: Self-pay

## 2017-12-06 ENCOUNTER — Encounter (HOSPITAL_COMMUNITY)
Admission: RE | Disposition: A | Discharge status: Home / Self Care | Payer: Self-pay | Source: Ambulatory Visit | Attending: Orthopedic Surgery

## 2017-12-06 DIAGNOSIS — E785 Hyperlipidemia, unspecified: Secondary | ICD-10-CM | POA: Diagnosis not present

## 2017-12-06 DIAGNOSIS — S83241A Other tear of medial meniscus, current injury, right knee, initial encounter: Secondary | ICD-10-CM | POA: Diagnosis not present

## 2017-12-06 DIAGNOSIS — M1711 Unilateral primary osteoarthritis, right knee: Secondary | ICD-10-CM

## 2017-12-06 DIAGNOSIS — K219 Gastro-esophageal reflux disease without esophagitis: Secondary | ICD-10-CM | POA: Insufficient documentation

## 2017-12-06 DIAGNOSIS — J449 Chronic obstructive pulmonary disease, unspecified: Secondary | ICD-10-CM | POA: Insufficient documentation

## 2017-12-06 DIAGNOSIS — M2341 Loose body in knee, right knee: Secondary | ICD-10-CM | POA: Diagnosis not present

## 2017-12-06 DIAGNOSIS — F419 Anxiety disorder, unspecified: Secondary | ICD-10-CM | POA: Insufficient documentation

## 2017-12-06 DIAGNOSIS — Z7982 Long term (current) use of aspirin: Secondary | ICD-10-CM | POA: Insufficient documentation

## 2017-12-06 DIAGNOSIS — M23231 Derangement of other medial meniscus due to old tear or injury, right knee: Secondary | ICD-10-CM

## 2017-12-06 DIAGNOSIS — Z79899 Other long term (current) drug therapy: Secondary | ICD-10-CM | POA: Insufficient documentation

## 2017-12-06 DIAGNOSIS — X58XXXA Exposure to other specified factors, initial encounter: Secondary | ICD-10-CM | POA: Insufficient documentation

## 2017-12-06 DIAGNOSIS — I10 Essential (primary) hypertension: Secondary | ICD-10-CM | POA: Diagnosis not present

## 2017-12-06 DIAGNOSIS — F17210 Nicotine dependence, cigarettes, uncomplicated: Secondary | ICD-10-CM | POA: Insufficient documentation

## 2017-12-06 DIAGNOSIS — I2510 Atherosclerotic heart disease of native coronary artery without angina pectoris: Secondary | ICD-10-CM | POA: Insufficient documentation

## 2017-12-06 HISTORY — PX: KNEE ARTHROSCOPY WITH MEDIAL MENISECTOMY: SHX5651

## 2017-12-06 SURGERY — ARTHROSCOPY, KNEE, WITH MEDIAL MENISCECTOMY
Anesthesia: General | Site: Knee | Laterality: Right

## 2017-12-06 MED ORDER — MIDAZOLAM HCL 2 MG/2ML IJ SOLN
1.0000 mg | INTRAMUSCULAR | Status: AC
  Administered 2017-12-06: 2 mg via INTRAVENOUS

## 2017-12-06 MED ORDER — FENTANYL CITRATE (PF) 100 MCG/2ML IJ SOLN: 25.0000 ug | INTRAMUSCULAR | Status: DC | PRN

## 2017-12-06 MED ORDER — SODIUM CHLORIDE 0.9 % IR SOLN
Status: DC | PRN
  Administered 2017-12-06: 1000 mL

## 2017-12-06 MED ORDER — NEOSTIGMINE METHYLSULFATE 10 MG/10ML IV SOLN
INTRAVENOUS | Status: AC
  Filled 2017-12-06: qty 1

## 2017-12-06 MED ORDER — OXYCODONE-ACETAMINOPHEN 5-325 MG PO TABS: 1.0000 | ORAL_TABLET | ORAL | 0 refills | Status: DC | PRN

## 2017-12-06 MED ORDER — FENTANYL CITRATE (PF) 250 MCG/5ML IJ SOLN
INTRAMUSCULAR | Status: AC
  Filled 2017-12-06: qty 5

## 2017-12-06 MED ORDER — SUCCINYLCHOLINE CHLORIDE 20 MG/ML IJ SOLN
INTRAMUSCULAR | Status: AC
  Filled 2017-12-06: qty 1

## 2017-12-06 MED ORDER — PHENYLEPHRINE HCL 10 MG/ML IJ SOLN
INTRAMUSCULAR | Status: DC | PRN
  Administered 2017-12-06: 80 ug via INTRAVENOUS
  Administered 2017-12-06 (×5): 40 ug via INTRAVENOUS

## 2017-12-06 MED ORDER — ONDANSETRON HCL 4 MG/2ML IJ SOLN
4.0000 mg | Freq: Once | INTRAMUSCULAR | Status: AC
  Administered 2017-12-06: 4 mg via INTRAVENOUS

## 2017-12-06 MED ORDER — IPRATROPIUM-ALBUTEROL 0.5-2.5 (3) MG/3ML IN SOLN
RESPIRATORY_TRACT | Status: AC
  Filled 2017-12-06: qty 3

## 2017-12-06 MED ORDER — BUPIVACAINE-EPINEPHRINE (PF) 0.5% -1:200000 IJ SOLN
INTRAMUSCULAR | Status: AC
  Filled 2017-12-06: qty 60

## 2017-12-06 MED ORDER — GLYCOPYRROLATE 0.2 MG/ML IJ SOLN
INTRAMUSCULAR | Status: AC
  Filled 2017-12-06: qty 1

## 2017-12-06 MED ORDER — EPINEPHRINE PF 1 MG/ML IJ SOLN
INTRAMUSCULAR | Status: AC
  Filled 2017-12-06: qty 3

## 2017-12-06 MED ORDER — IPRATROPIUM-ALBUTEROL 0.5-2.5 (3) MG/3ML IN SOLN
3.0000 mL | Freq: Once | RESPIRATORY_TRACT | Status: AC
  Administered 2017-12-06: 3 mL via RESPIRATORY_TRACT

## 2017-12-06 MED ORDER — ONDANSETRON HCL 4 MG/2ML IJ SOLN
INTRAMUSCULAR | Status: AC
  Filled 2017-12-06: qty 2

## 2017-12-06 MED ORDER — NEOSTIGMINE METHYLSULFATE 10 MG/10ML IV SOLN
INTRAVENOUS | Status: DC | PRN
  Administered 2017-12-06: 4 mg via INTRAVENOUS

## 2017-12-06 MED ORDER — PROPOFOL 10 MG/ML IV BOLUS
INTRAVENOUS | Status: DC | PRN
  Administered 2017-12-06: 150 mg via INTRAVENOUS
  Administered 2017-12-06: 50 mg via INTRAVENOUS

## 2017-12-06 MED ORDER — SODIUM CHLORIDE 0.9 % IR SOLN
Status: DC | PRN
  Administered 2017-12-06 (×3): 3000 mL

## 2017-12-06 MED ORDER — CEFAZOLIN SODIUM-DEXTROSE 2-4 GM/100ML-% IV SOLN
2.0000 g | INTRAVENOUS | Status: AC
  Administered 2017-12-06: 2 g via INTRAVENOUS
  Filled 2017-12-06: qty 100

## 2017-12-06 MED ORDER — PHENYLEPHRINE 40 MCG/ML (10ML) SYRINGE FOR IV PUSH (FOR BLOOD PRESSURE SUPPORT)
PREFILLED_SYRINGE | INTRAVENOUS | Status: AC
  Filled 2017-12-06: qty 10

## 2017-12-06 MED ORDER — PROPOFOL 500 MG/50ML IV EMUL: INTRAVENOUS | Status: DC | PRN

## 2017-12-06 MED ORDER — DEXAMETHASONE SODIUM PHOSPHATE 4 MG/ML IJ SOLN
4.0000 mg | Freq: Once | INTRAMUSCULAR | Status: AC
  Administered 2017-12-06: 4 mg via INTRAVENOUS

## 2017-12-06 MED ORDER — BUPIVACAINE-EPINEPHRINE (PF) 0.5% -1:200000 IJ SOLN
INTRAMUSCULAR | Status: DC | PRN
  Administered 2017-12-06: 50 mL
  Administered 2017-12-06: 10 mL

## 2017-12-06 MED ORDER — FENTANYL CITRATE (PF) 100 MCG/2ML IJ SOLN
INTRAMUSCULAR | Status: DC | PRN
  Administered 2017-12-06 (×2): 50 ug via INTRAVENOUS

## 2017-12-06 MED ORDER — CHLORHEXIDINE GLUCONATE 4 % EX LIQD: 60.0000 mL | Freq: Once | CUTANEOUS | Status: DC

## 2017-12-06 MED ORDER — GLYCOPYRROLATE 0.2 MG/ML IJ SOLN
INTRAMUSCULAR | Status: DC | PRN
  Administered 2017-12-06: 0.4 mg via INTRAVENOUS

## 2017-12-06 MED ORDER — ROCURONIUM BROMIDE 50 MG/5ML IV SOLN
INTRAVENOUS | Status: AC
  Filled 2017-12-06: qty 1

## 2017-12-06 MED ORDER — POVIDONE-IODINE 10 % EX SWAB: 2.0000 "application " | Freq: Once | CUTANEOUS | Status: DC

## 2017-12-06 MED ORDER — LIDOCAINE HCL 1 % IJ SOLN
INTRAMUSCULAR | Status: DC | PRN
  Administered 2017-12-06: 50 mg via INTRADERMAL

## 2017-12-06 MED ORDER — PROPOFOL 10 MG/ML IV BOLUS
INTRAVENOUS | Status: AC
  Filled 2017-12-06: qty 20

## 2017-12-06 MED ORDER — DEXAMETHASONE SODIUM PHOSPHATE 4 MG/ML IJ SOLN
INTRAMUSCULAR | Status: AC
  Filled 2017-12-06: qty 1

## 2017-12-06 MED ORDER — MIDAZOLAM HCL 2 MG/2ML IJ SOLN
INTRAMUSCULAR | Status: AC
  Filled 2017-12-06: qty 2

## 2017-12-06 MED ORDER — LACTATED RINGERS IV SOLN
INTRAVENOUS | Status: DC
  Administered 2017-12-06 (×2): via INTRAVENOUS

## 2017-12-06 SURGICAL SUPPLY — 51 items
BAG HAMPER (MISCELLANEOUS) ×2 IMPLANT
BANDAGE ELASTIC 6 LF NS (GAUZE/BANDAGES/DRESSINGS) ×2 IMPLANT
BLADE AGGRESSIVE PLUS 4.0 (BLADE) ×2 IMPLANT
BLADE SURG SZ11 CARB STEEL (BLADE) ×2 IMPLANT
BNDG CMPR MED 5X6 ELC HKLP NS (GAUZE/BANDAGES/DRESSINGS) ×1
CHLORAPREP W/TINT 26ML (MISCELLANEOUS) ×2 IMPLANT
CLOTH BEACON ORANGE TIMEOUT ST (SAFETY) ×2 IMPLANT
COOLER CRYO IC GRAV AND TUBE (ORTHOPEDIC SUPPLIES) ×2 IMPLANT
CUFF CRYO KNEE18X23 MED (MISCELLANEOUS) ×1 IMPLANT
CUFF TOURNIQUET SINGLE 34IN LL (TOURNIQUET CUFF) ×1 IMPLANT
CUTTER ANGLED DBL BITE 4.5 (BURR) ×1 IMPLANT
DECANTER SPIKE VIAL GLASS SM (MISCELLANEOUS) ×4 IMPLANT
GAUZE SPONGE 4X4 12PLY STRL (GAUZE/BANDAGES/DRESSINGS) ×2 IMPLANT
GAUZE SPONGE 4X4 16PLY XRAY LF (GAUZE/BANDAGES/DRESSINGS) ×2 IMPLANT
GAUZE XEROFORM 5X9 LF (GAUZE/BANDAGES/DRESSINGS) ×2 IMPLANT
GLOVE BIOGEL PI IND STRL 7.0 (GLOVE) ×1 IMPLANT
GLOVE BIOGEL PI INDICATOR 7.0 (GLOVE) ×1
GLOVE ECLIPSE 6.5 STRL STRAW (GLOVE) ×1 IMPLANT
GLOVE SKINSENSE NS SZ8.0 LF (GLOVE) ×1
GLOVE SKINSENSE STRL SZ8.0 LF (GLOVE) ×1 IMPLANT
GLOVE SS N UNI LF 8.5 STRL (GLOVE) ×2 IMPLANT
GLOVE SURG SS PI 6.5 STRL IVOR (GLOVE) ×2 IMPLANT
GOWN STRL REUS W/ TWL LRG LVL3 (GOWN DISPOSABLE) ×1 IMPLANT
GOWN STRL REUS W/TWL LRG LVL3 (GOWN DISPOSABLE) ×2
GOWN STRL REUS W/TWL XL LVL3 (GOWN DISPOSABLE) ×2 IMPLANT
HLDR LEG FOAM (MISCELLANEOUS) ×1 IMPLANT
IV NS IRRIG 3000ML ARTHROMATIC (IV SOLUTION) ×4 IMPLANT
KIT BLADEGUARD II DBL (SET/KITS/TRAYS/PACK) ×2 IMPLANT
KIT ROOM TURNOVER AP CYSTO (KITS) ×2 IMPLANT
LEG HOLDER FOAM (MISCELLANEOUS) ×1
MANIFOLD NEPTUNE II (INSTRUMENTS) ×2 IMPLANT
MARKER SKIN DUAL TIP RULER LAB (MISCELLANEOUS) ×2 IMPLANT
NDL HYPO 18GX1.5 BLUNT FILL (NEEDLE) ×1 IMPLANT
NDL HYPO 21X1.5 SAFETY (NEEDLE) ×1 IMPLANT
NDL SPNL 18GX3.5 QUINCKE PK (NEEDLE) ×1 IMPLANT
NEEDLE HYPO 18GX1.5 BLUNT FILL (NEEDLE) ×2 IMPLANT
NEEDLE HYPO 21X1.5 SAFETY (NEEDLE) ×2 IMPLANT
NEEDLE SPNL 18GX3.5 QUINCKE PK (NEEDLE) ×2 IMPLANT
NS IRRIG 1000ML POUR BTL (IV SOLUTION) ×2 IMPLANT
PACK ARTHRO LIMB DRAPE STRL (MISCELLANEOUS) ×2 IMPLANT
PAD ABD 5X9 TENDERSORB (GAUZE/BANDAGES/DRESSINGS) ×2 IMPLANT
PAD ARMBOARD 7.5X6 YLW CONV (MISCELLANEOUS) ×2 IMPLANT
PADDING CAST COTTON 6X4 STRL (CAST SUPPLIES) ×2 IMPLANT
PROBE BIPOLAR 50 DEGREE SUCT (MISCELLANEOUS) ×1 IMPLANT
SET ARTHROSCOPY INST (INSTRUMENTS) ×2 IMPLANT
SET BASIN LINEN APH (SET/KITS/TRAYS/PACK) ×2 IMPLANT
SUT ETHILON 3 0 FSL (SUTURE) ×2 IMPLANT
SYR 30ML LL (SYRINGE) ×2 IMPLANT
SYRINGE 10CC LL (SYRINGE) ×2 IMPLANT
TUBE CONNECTING 12X1/4 (SUCTIONS) ×4 IMPLANT
TUBING ARTHRO INFLOW-ONLY STRL (TUBING) ×2 IMPLANT

## 2017-12-06 NOTE — Anesthesia Procedure Notes (Signed)
Procedure Name: Intubation Date/Time: 12/06/2017 11:42 AM Performed by: Ollen Bowl, CRNA Pre-anesthesia Checklist: Patient identified, Patient being monitored, Timeout performed, Emergency Drugs available and Suction available Patient Re-evaluated:Patient Re-evaluated prior to induction Oxygen Delivery Method: Circle system utilized Preoxygenation: Pre-oxygenation with 100% oxygen Induction Type: IV induction, Rapid sequence and Cricoid Pressure applied Ventilation: Mask ventilation without difficulty Grade View: Grade I Tube type: Oral Tube size: 7.0 mm Number of attempts: 1 Airway Equipment and Method: Stylet and Video-laryngoscopy Placement Confirmation: ETT inserted through vocal cords under direct vision,  positive ETCO2 and breath sounds checked- equal and bilateral Secured at: 21 cm Tube secured with: Tape Dental Injury: Teeth and Oropharynx as per pre-operative assessment

## 2017-12-06 NOTE — Brief Op Note (Addendum)
12/06/2017  12:30 PM  PATIENT:  Trevor Berg  67 y.o. male  PRE-OPERATIVE DIAGNOSIS:  medial meniscus tear and osteoarthritis right knee  POST-OPERATIVE DIAGNOSIS:   PROCEDURE:  Procedure(s): RIGHT KNEE ARTHROSCOPY WITH PARTIAL MEDIAL MENISECTOMY; REMOVAL OF LOOSE BODY (Right)   Medial meniscus tear loose body osteoarthritis right knee  Surgical findings torn posterior horn medial meniscus grade III chondromalacia medial femoral condyle extensive synovitis suprapatellar pouch  ACL PCL intact  Lateral compartment normal  No assistance  General anesthesia  No blood loss  No drains  60 cc of Marcaine with epinephrine half percent Marcaine injected   SPECIMEN:  No Specimen  DISPOSITION OF SPECIMEN:  N/A  COUNTS:  YES  TOURNIQUET:  * Missing tourniquet times found for documented tourniquets in log: [id number] *  DICTATION: .Dragon Dictation  PLAN OF CARE: Discharge to home after PACU  PATIENT DISPOSITION:  PACU - hemodynamically stable.   Delay start of Pharmacological VTE agent (>24hrs) due to surgical blood loss or risk of bleeding: not applicable   Knee arthroscopy dictation  The patient was identified in the preoperative holding area using 2 approved identification mechanisms. The chart was reviewed and updated. The surgical site was confirmed as right knee and marked with an indelible marker.  The patient was taken to the operating room for anesthesia. After successful general anesthesia, Ancef was used as IV antibiotics.  The patient was placed in the supine position with the (right) the operative extremity in an arthroscopic leg holder and the opposite extremity in a padded leg holder.  The timeout was executed.  A lateral portal was established with an 11 blade and the scope was introduced into the joint. A diagnostic arthroscopy was performed in circumferential manner examining the entire knee joint. A medial portal was established and the diagnostic  arthroscopy was repeated using a probe to palpate intra-articular structures as they were encountered.   Primary surgical findings torn medial meniscus right knee posterior horn And loose body right knee anterolateral compartment   The medial meniscus was resected using a duckbill forceps. The meniscal fragments were removed with a motorized shaver. The meniscus was balanced with a combination of a motorized shaver and a 50 ArthroCare wand until a stable rim was obtained.  Loose body was removed with a grasper  The arthroscopic pump was placed on the wash mode and any excess debris was removed from the joint using suction.  60 cc of Marcaine with epinephrine was injected through the arthroscope.  The portals were closed with 3-0 nylon suture.  A sterile bandage, Ace wrap and Cryo/Cuff was placed and the Cryo/Cuff was activated. The patient was taken to the recovery room in stable condition.  [id number] [phone]

## 2017-12-06 NOTE — Anesthesia Postprocedure Evaluation (Signed)
Anesthesia Post Note  Patient: Trevor Berg  Procedure(s) Performed: RIGHT KNEE ARTHROSCOPY WITH PARTIAL MEDIAL MENISECTOMY; REMOVAL OF LOOSE BODY (Right Knee)  Patient location during evaluation: PACU Level of consciousness: awake, awake and alert, oriented and patient cooperative Pain management: pain level controlled Vital Signs Assessment: post-procedure vital signs reviewed and stable Respiratory status: spontaneous breathing and patient connected to nasal cannula oxygen Cardiovascular status: blood pressure returned to baseline and stable Postop Assessment: no apparent nausea or vomiting and adequate PO intake Anesthetic complications: no     Last Vitals:  Vitals:   12/06/17 1125 12/06/17 1245  BP:  (!) 114/101  Pulse:  83  Resp: 15 (!) 21  Temp:  36.7 C  SpO2: 90% 90%    Last Pain:  Vitals:   12/06/17 1245  TempSrc:   PainSc: 0-No pain                 Tyyne Cliett N

## 2017-12-06 NOTE — Discharge Instructions (Signed)

## 2017-12-06 NOTE — Anesthesia Preprocedure Evaluation (Signed)
Anesthesia Evaluation  Patient identified by MRN, date of birth, ID band Patient awake    Reviewed: Allergy & Precautions, NPO status , Patient's Chart, lab work & pertinent test results  Airway Mallampati: III  TM Distance: >3 FB Neck ROM: Limited    Dental  (+) Edentulous Upper, Edentulous Lower   Pulmonary asthma , COPD,  COPD inhaler, Current Smoker,    breath sounds clear to auscultation       Cardiovascular hypertension, Pt. on medications (-) angina+ CAD ( noncritical coronary disease in 2005)   Rhythm:Regular Rate:Normal     Neuro/Psych PSYCHIATRIC DISORDERS Anxiety    GI/Hepatic GERD  Medicated and Poorly Controlled,  Endo/Other    Renal/GU      Musculoskeletal   Abdominal   Peds  Hematology   Anesthesia Other Findings   Reproductive/Obstetrics                             Anesthesia Physical Anesthesia Plan  ASA: III  Anesthesia Plan: General   Post-op Pain Management:    Induction: Intravenous, Rapid sequence and Cricoid pressure planned  PONV Risk Score and Plan:   Airway Management Planned: Oral ETT and Video Laryngoscope Planned  Additional Equipment:   Intra-op Plan:   Post-operative Plan: Extubation in OR  Informed Consent: I have reviewed the patients History and Physical, chart, labs and discussed the procedure including the risks, benefits and alternatives for the proposed anesthesia with the patient or authorized representative who has indicated his/her understanding and acceptance.     Plan Discussed with:   Anesthesia Plan Comments:         Anesthesia Quick Evaluation

## 2017-12-06 NOTE — Op Note (Signed)
12/06/2017  12:30 PM  PATIENT:  Trevor Berg  67 y.o. male  PRE-OPERATIVE DIAGNOSIS:  medial meniscus tear and osteoarthritis right knee  POST-OPERATIVE DIAGNOSIS:   PROCEDURE:  Procedure(s): RIGHT KNEE ARTHROSCOPY WITH PARTIAL MEDIAL MENISECTOMY; REMOVAL OF LOOSE BODY (Right)   Medial meniscus tear loose body osteoarthritis right knee  Surgical findings torn posterior horn medial meniscus grade III chondromalacia medial femoral condyle extensive synovitis suprapatellar pouch  ACL PCL intact  Lateral compartment normal  No assistance  General anesthesia  No blood loss  No drains  60 cc of Marcaine with epinephrine half percent Marcaine injected   SPECIMEN:  No Specimen  DISPOSITION OF SPECIMEN:  N/A  COUNTS:  YES  TOURNIQUET:  * Missing tourniquet times found for documented tourniquets in log: [id number] *  DICTATION: .Dragon Dictation  PLAN OF CARE: Discharge to home after PACU  PATIENT DISPOSITION:  PACU - hemodynamically stable.   Delay start of Pharmacological VTE agent (>24hrs) due to surgical blood loss or risk of bleeding: not applicable   Knee arthroscopy dictation  The patient was identified in the preoperative holding area using 2 approved identification mechanisms. The chart was reviewed and updated. The surgical site was confirmed as right knee and marked with an indelible marker.  The patient was taken to the operating room for anesthesia. After successful general anesthesia, Ancef was used as IV antibiotics.  The patient was placed in the supine position with the (right) the operative extremity in an arthroscopic leg holder and the opposite extremity in a padded leg holder.  The timeout was executed.  A lateral portal was established with an 11 blade and the scope was introduced into the joint. A diagnostic arthroscopy was performed in circumferential manner examining the entire knee joint. A medial portal was established and the diagnostic  arthroscopy was repeated using a probe to palpate intra-articular structures as they were encountered.   Primary surgical findings torn medial meniscus right knee posterior horn And loose body right knee anterolateral compartment   The medial meniscus was resected using a duckbill forceps. The meniscal fragments were removed with a motorized shaver. The meniscus was balanced with a combination of a motorized shaver and a 50 ArthroCare wand until a stable rim was obtained.  Loose body was removed with a grasper  The arthroscopic pump was placed on the wash mode and any excess debris was removed from the joint using suction.  60 cc of Marcaine with epinephrine was injected through the arthroscope.  The portals were closed with 3-0 nylon suture.  A sterile bandage, Ace wrap and Cryo/Cuff was placed and the Cryo/Cuff was activated. The patient was taken to the recovery room in stable condition.  [id number] [phone]

## 2017-12-06 NOTE — Transfer of Care (Signed)
Immediate Anesthesia Transfer of Care Note  Patient: Trevor Berg  Procedure(s) Performed: RIGHT KNEE ARTHROSCOPY WITH PARTIAL MEDIAL MENISECTOMY; REMOVAL OF LOOSE BODY (Right Knee)  Patient Location: PACU  Anesthesia Type:General  Level of Consciousness: awake, alert , oriented and patient cooperative  Airway & Oxygen Therapy: Patient Spontanous Breathing and Patient connected to nasal cannula oxygen  Post-op Assessment: Report given to RN, Post -op Vital signs reviewed and stable and Patient moving all extremities X 4  Post vital signs: Reviewed and stable  Last Vitals:  Vitals:   12/06/17 1125 12/06/17 1245  BP:  (!) 114/101  Pulse:  83  Resp: 15 (!) 21  Temp:  36.7 C  SpO2: 90% 90%    Last Pain:  Vitals:   12/06/17 1006  TempSrc: Oral  PainSc: 7       Patients Stated Pain Goal: 6 (15/61/53 7943)  Complications: No apparent anesthesia complications

## 2017-12-06 NOTE — Interval H&P Note (Signed)
History and Physical Interval Note:  12/06/2017 10:52 AM  Trevor Berg  has presented today for surgery, with the diagnosis of medial meniscus tera and OA right knee  The various methods of treatment have been discussed with the patient and family. After consideration of risks, benefits and other options for treatment, the patient has consented to  Procedure(s): KNEE ARTHROSCOPY WITH MEDIAL MENISECTOMY (Right) as a surgical intervention .  The patient's history has been reviewed, patient examined, no change in status, stable for surgery.  I have reviewed the patient's chart and labs.  Questions were answered to the patient's satisfaction.     [name] Abbott

## 2017-12-07 ENCOUNTER — Encounter (HOSPITAL_COMMUNITY): Payer: Self-pay | Admitting: Orthopedic Surgery

## 2017-12-13 DIAGNOSIS — Z9889 Other specified postprocedural states: Secondary | ICD-10-CM | POA: Insufficient documentation

## 2017-12-17 ENCOUNTER — Ambulatory Visit (INDEPENDENT_AMBULATORY_CARE_PROVIDER_SITE_OTHER): Payer: Medicare HMO | Admitting: Orthopedic Surgery

## 2017-12-17 VITALS — BP 112/71 | HR 65 | Ht 67.0 in | Wt 204.0 lb

## 2017-12-17 DIAGNOSIS — Z9889 Other specified postprocedural states: Secondary | ICD-10-CM

## 2017-12-17 MED ORDER — HYDROCODONE-ACETAMINOPHEN 5-325 MG PO TABS: 1.0000 | ORAL_TABLET | Freq: Four times a day (QID) | ORAL | 0 refills | Status: DC | PRN

## 2017-12-17 NOTE — Progress Notes (Signed)
Chief Complaint  Patient presents with  . Follow-up    Recheck on right knee, DOS 12-06-17.    Postop appointment after arthroscopy  Portals were clean dry and intact sutures were removed the patient had very mild swelling is ambulate with a rolling.  He can get the knee to full extension passively 5 degrees extension loss actively with 95 degrees of knee flexion  PEP PADS EXERCISES can be started at home  Medication was refilled  3-week follow-up

## 2017-12-17 NOTE — Patient Instructions (Signed)
Ice the knee 3 times a day for 30 minutes   do the exercises as instructed on the paper you were given  Return in 3 weeks

## 2018-01-09 ENCOUNTER — Ambulatory Visit (INDEPENDENT_AMBULATORY_CARE_PROVIDER_SITE_OTHER): Payer: Medicare HMO | Admitting: Orthopedic Surgery

## 2018-01-09 VITALS — BP 120/63 | HR 68 | Ht 67.0 in | Wt 204.0 lb

## 2018-01-09 DIAGNOSIS — Z9889 Other specified postprocedural states: Secondary | ICD-10-CM

## 2018-01-09 DIAGNOSIS — M1711 Unilateral primary osteoarthritis, right knee: Secondary | ICD-10-CM

## 2018-01-09 DIAGNOSIS — S83241D Other tear of medial meniscus, current injury, right knee, subsequent encounter: Secondary | ICD-10-CM

## 2018-01-09 MED ORDER — HYDROCODONE-ACETAMINOPHEN 5-325 MG PO TABS: 1.0000 | ORAL_TABLET | Freq: Four times a day (QID) | ORAL | 0 refills | Status: DC | PRN

## 2018-01-09 NOTE — Progress Notes (Signed)
This is a postop appointment   Chief Complaint  Patient presents with  . Follow-up    Recheck on right knee, DOS 12-06-17.    67 years old had knee arthroscopy approximately a month ago  12/06/2017  12:30 PM  PATIENT:  Trevor Berg  67 y.o. male  PRE-OPERATIVE DIAGNOSIS:  medial meniscus tear and osteoarthritis right knee  POST-OPERATIVE DIAGNOSIS:   PROCEDURE:  Procedure(s): RIGHT KNEE ARTHROSCOPY WITH PARTIAL MEDIAL MENISECTOMY; REMOVAL OF LOOSE BODY (Right)   Medial meniscus tear loose body osteoarthritis right knee  Surgical findings torn posterior horn medial meniscus grade III chondromalacia medial femoral condyle extensive synovitis suprapatellar pouch  ACL PCL intact  Lateral compartment normal  He comes in today walking with a walker his range of motion is 0-120 degrees he has no effusion he has some tenderness around the knee joint  He is improving slowly he would like some more medicine for pain  He is advised to continue exercising his knee for strengthening and range of motion and come back in a month

## 2018-02-06 ENCOUNTER — Ambulatory Visit (INDEPENDENT_AMBULATORY_CARE_PROVIDER_SITE_OTHER): Payer: Self-pay | Admitting: Orthopedic Surgery

## 2018-02-06 VITALS — BP 109/65 | HR 69 | Ht 67.0 in | Wt 204.0 lb

## 2018-02-06 DIAGNOSIS — Z9889 Other specified postprocedural states: Secondary | ICD-10-CM

## 2018-02-06 MED ORDER — HYDROCODONE-ACETAMINOPHEN 5-325 MG PO TABS: 1.0000 | ORAL_TABLET | Freq: Four times a day (QID) | ORAL | 0 refills | Status: DC | PRN

## 2018-02-06 NOTE — Progress Notes (Signed)
POSTOP VISIT   POD # 69, 9weeks 6/7   BP 109/65   Pulse 69   Ht 5\' 7"  (1.702 m)   Wt 204 lb (92.5 kg)   BMI 31.95 kg/m   Encounter Diagnosis  Name Primary?  . S/P right knee arthroscopy 12/06/17 Yes   Chief Complaint  Patient presents with  . Follow-up    Recheck on righ tknee, [place] 12-06-17.    67 year old male says his knee is feeling better after arthroscopy  Knee exam today shows 3 degrees lack of full extension knee flexion 130 degrees knee is stable no effusion mild tenderness along the medial compartment  Overall knee looks good he is walking without any supportive devices no significant limping is noted.  We settled on 2 weeks of hydrocodone and then he will try to stop that and do without it  I will see him in 6 months to evaluate the arthritis in the knee with an x-ray   12/06/2017  12:30 PM  PATIENT:  Trevor Berg  67 y.o. male  PRE-OPERATIVE DIAGNOSIS:  medial meniscus tear and osteoarthritis right knee  POST-OPERATIVE DIAGNOSIS:   PROCEDURE:  Procedure(s): RIGHT KNEE ARTHROSCOPY WITH PARTIAL MEDIAL MENISECTOMY; REMOVAL OF LOOSE BODY (Right)   [id number]  Medial meniscus tear loose body osteoarthritis right knee  Surgical findings torn posterior horn medial meniscus grade III chondromalacia medial femoral condyle extensive synovitis suprapatellar pouch  ACL PCL intact  Lateral compartment normal

## 2018-02-06 NOTE — Addendum Note (Signed)
Addended by: Carole Civil on: 02/06/2018 12:23 PM   Modules accepted: Orders

## 2018-03-18 ENCOUNTER — Other Ambulatory Visit: Payer: Self-pay | Admitting: Orthopedic Surgery

## 2018-03-18 DIAGNOSIS — Z9889 Other specified postprocedural states: Secondary | ICD-10-CM

## 2018-03-18 MED ORDER — HYDROCODONE-ACETAMINOPHEN 5-325 MG PO TABS
1.0000 | ORAL_TABLET | Freq: Three times a day (TID) | ORAL | 0 refills | Status: DC | PRN
Start: 2018-03-18 — End: 2018-03-18

## 2018-03-18 NOTE — Telephone Encounter (Signed)
Patient requests refill on Hydrocodone/Acetaminophen 5-325  Mgs.   Qty  28   Sig: Take 1 tablet by mouth every 6 (six) hours as needed for moderate pain.  Patient uses [facility]

## 2018-04-01 ENCOUNTER — Encounter: Payer: Self-pay | Admitting: *Deleted

## 2018-04-01 ENCOUNTER — Ambulatory Visit (INDEPENDENT_AMBULATORY_CARE_PROVIDER_SITE_OTHER): Payer: Medicare HMO | Admitting: Cardiology

## 2018-04-01 VITALS — BP 122/70 | HR 71 | Ht 66.0 in | Wt 184.8 lb

## 2018-04-01 DIAGNOSIS — I10 Essential (primary) hypertension: Secondary | ICD-10-CM | POA: Diagnosis not present

## 2018-04-01 DIAGNOSIS — R0789 Other chest pain: Secondary | ICD-10-CM | POA: Diagnosis not present

## 2018-04-01 DIAGNOSIS — E782 Mixed hyperlipidemia: Secondary | ICD-10-CM

## 2018-04-01 NOTE — Progress Notes (Signed)
Clinical Summary Trevor Berg is a 67 y.o.male seen today for follow up of the following medical problems  1.CAD - long history of chest pain - cath in 2005 without mild to moderate non-obstructive disease - negative stress echo 09/2012, baseline LVEF 55-60% - 07/2015 Lexiscan was ordered due to chest pain. It showed an inferior defect due to subdiaphragmatic attenuation, no ischemia.  -  03/2017 EGD with multiple erosions of stomach   - still with chest pain. Variable symptoms as far as character and locatoin. - sharp pain, midchest/epigastric. 8/10 in severity. Can occur at rest or with activity. +SOB. Can feel sweaty - constant pain epigastric. Sharp pain lasts 15-20 minutes. Occurs 5-6 times a week.    2. COPD - compliant with inhalers  3. HTN - compliant with meds   4. Hyperlipidemia appears to be on both atorva and simva - 02/2017 TC 154 TG 120 HDL 36 LDL 94   5. AAA screen - history of smoking, male age 64 - 08/2017 no aneurysm  SH: works as a Physiological scientist    Past Medical History:  Diagnosis Date  . Anginal pain ([place])   . Anxiety   . Asthma   . Chest pain 2005   noncritical coronary disease in 2005: 40% distal RCA, 30% CX, 50% OM 2, EF-60%,  . COPD (chronic obstructive pulmonary disease) ([facility])    Chronic bronchitis; 100-pack-year cigarette consumption  . Gastroesophageal reflux disease    possible small Schatzki's ring; esophageal dilatation in 2005  . H. pylori infection 2014   treated with prevpac  . Hyperlipidemia   . Hypertension   . Overweight(278.02)   . Tobacco abuse    100 pack years     Allergies  Allergen Reactions  . Chantix [Varenicline]     Nightmares and personality changes.  . Tomato Rash     Current Outpatient Medications  Medication Sig Dispense Refill  . albuterol (PROVENTIL HFA;VENTOLIN HFA) 108 (90 BASE) MCG/ACT inhaler Inhale 2 puffs into the lungs every 6 (six) hours as needed for shortness of breath.     Marland Kitchen  amLODipine (NORVASC) 2.5 MG tablet Take 2.5 mg by mouth daily.    Jearl Klinefelter ELLIPTA 62.5-25 MCG/INH AEPB Inhale 1 puff into the lungs daily.     Marland Kitchen aspirin EC 81 MG tablet Take 81 mg by mouth daily.    Marland Kitchen atenolol (TENORMIN) 50 MG tablet TAKE ONE TABLET BY MOUTH ONCE DAILY IN THE MORNING 30 tablet 3  . atorvastatin (LIPITOR) 20 MG tablet Take 20 mg by mouth every morning.    Marland Kitchen DEXILANT 60 MG capsule Take 60 mg by mouth daily.     Marland Kitchen dextromethorphan-guaiFENesin (MUCINEX DM) 30-600 MG 12hr tablet Take 1 tablet by mouth every 12 (twelve) hours.     . hydrochlorothiazide (MICROZIDE) 12.5 MG capsule TAKE 1 CAPSULE BY MOUTH ONCE A DAY FOR HTN/SWELLING  3  . ibuprofen (ADVIL,MOTRIN) 800 MG tablet Take 800 mg by mouth 3 (three) times daily as needed for moderate pain.     Marland Kitchen [name] 290 MCG CAPS capsule Take 1 capsule (290 mcg total) by mouth daily. 90 capsule 3  . lisinopril (PRINIVIL,ZESTRIL) 40 MG tablet Take 1 tablet (40 mg total) by mouth daily. 90 tablet 3  . LORazepam (ATIVAN) 1 MG tablet Take 1 mg by mouth 3 (three) times daily as needed for anxiety.     . Naphazoline HCl (CLEAR EYES OP) Apply 1 drop to eye daily as needed (dry eyes).    Marland Kitchen  nitroGLYCERIN (NITROSTAT) 0.4 MG SL tablet Place 0.4 mg under the tongue every 5 (five) minutes as needed for chest pain. Chest Pain    . oxyCODONE-acetaminophen (PERCOCET/ROXICET) 5-325 MG tablet Take 1 tablet by mouth every 4 (four) hours as needed for severe pain. 30 tablet 0  . pantoprazole (PROTONIX) 40 MG tablet Take 40 mg by mouth daily.    . polyethylene glycol powder (GLYCOLAX/MIRALAX) powder USE 1 CAPFUL DAILY AS NEEDED FOR CONSTIPATION (Patient taking differently: USE 1 CAPFUL DAILY) 527 g 11  . sertraline (ZOLOFT) 50 MG tablet Take 50 mg by mouth daily.    . simvastatin (ZOCOR) 20 MG tablet Take 20 mg by mouth at bedtime.    . traZODone (DESYREL) 50 MG tablet Take 50 mg by mouth at bedtime.  0  . vitamin B-12 (CYANOCOBALAMIN) 1000 MCG tablet Take 2,000  mcg by mouth daily.      No current facility-administered medications for this visit.      Past Surgical History:  Procedure Laterality Date  . CERVICAL SPINE SURGERY     C5-6 and C6-7: Relief of spinal stenosis with corpectomy, foraminotomy and fusion  . COLONOSCOPY  10/19/2004   RMR:   Friable anal canal, likely source of patient's hematochezia/  Otherwise normal rectum, normal colon.  . COLONOSCOPY N/A 09/09/2015   Procedure: COLONOSCOPY;  Surgeon: Daneil Dolin, MD;  [facility];  Service: Endoscopy;  Laterality: N/A;  1015  . ESOPHAGEAL DILATION N/A 09/09/2015   Procedure: ESOPHAGEAL DILATION;  Surgeon: Daneil Dolin, MD;  [facility]: [facility];  Service: Endoscopy;  Laterality: N/A;  . ESOPHAGOGASTRODUODENOSCOPY  09/27/2004   RMR:   A couple of tiny, distal esophageal erosions, otherwise normal esophagus aside from a Schatzki ring status post Maloney dilation as described above/ Small hiatal hernia, otherwise normal stomach, normal D1-D2  . ESOPHAGOGASTRODUODENOSCOPY N/A 09/09/2015   Procedure: ESOPHAGOGASTRODUODENOSCOPY (EGD);  Surgeon: Daneil Dolin, MD;  [facility];  Service: Endoscopy;  Laterality: N/A;  . ESOPHAGOGASTRODUODENOSCOPY N/A 04/19/2017   Procedure: ESOPHAGOGASTRODUODENOSCOPY (EGD);  Surgeon: Daneil Dolin, MD;  [facility]: [facility];  Service: Endoscopy;  Laterality: N/A;  730   . ESOPHAGOGASTRODUODENOSCOPY (EGD) WITH ESOPHAGEAL DILATION N/A 03/19/2013   WEX:HBZJIR esophagus s/p dilation/Abnormal stomach of uncertain clinical significance-s/p bx positive for H. pylori, status post Prevpac treatment  . KNEE ARTHROSCOPY WITH MEDIAL MENISECTOMY Right 12/06/2017   Procedure: RIGHT KNEE ARTHROSCOPY WITH PARTIAL MEDIAL MENISECTOMY; REMOVAL OF LOOSE BODY;  Surgeon: Carole Civil, MD;  Location: [facility];  Service: Orthopedics;  Laterality: Right;  . MALONEY DILATION N/A 04/19/2017   Procedure: Venia Minks DILATION;  Surgeon: Daneil Dolin, MD;   [facility];  Service: Endoscopy;  Laterality: N/A;  . ORIF ANKLE FRACTURE     right  . ORIF TIBIA FRACTURE     Left  . TONSILLECTOMY       Allergies  Allergen Reactions  . Chantix [Varenicline]     Nightmares and personality changes.  . Tomato Rash      Family History  Problem Relation Age of Onset  . Heart attack Mother        deceased, age 38s  . Ulcers Brother   . Diabetes Brother   . Hypertension Unknown   . Colon cancer Neg Hx      Social History Trevor Berg reports that he has been smoking cigarettes.  He has a 50.00 pack-year smoking history. He has never used smokeless tobacco. Trevor Berg  reports that he does not drink alcohol.   Review of Systems CONSTITUTIONAL: No weight loss, fever, chills, weakness or fatigue.  HEENT: Eyes: No visual loss, blurred vision, double vision or yellow sclerae.No hearing loss, sneezing, congestion, runny nose or sore throat.  SKIN: No rash or itching.  CARDIOVASCULAR: per hpi RESPIRATORY: No shortness of breath, cough or sputum.  GASTROINTESTINAL: No anorexia, nausea, vomiting or diarrhea. No abdominal pain or blood.  GENITOURINARY: No burning on urination, no polyuria NEUROLOGICAL: No headache, dizziness, syncope, paralysis, ataxia, numbness or tingling in the extremities. No change in bowel or bladder control.  MUSCULOSKELETAL: No muscle, back pain, joint pain or stiffness.  LYMPHATICS: No enlarged nodes. No history of splenectomy.  PSYCHIATRIC: No history of depression or anxiety.  ENDOCRINOLOGIC: No reports of sweating, cold or heat intolerance. No polyuria or polydipsia.  Marland Kitchen   Physical Examination Vitals:   04/01/18 1547  BP: 122/70  Pulse: 71  SpO2: 96%    Gen: resting comfortably, no acute distress HEENT: no scleral icterus, pupils equal round and reactive, no palptable cervical adenopathy,  CV: RRR, no m/r/g, no jvd Resp: Clear to auscultation bilaterally GI: abdomen is soft, non-tender,  non-distended, normal bowel sounds, no hepatosplenomegaly MSK: extremities are warm, no edema.  Skin: warm, no rash Neuro:  no focal deficits Psych: appropriate affect   Diagnostic Studies 07/2004 Cath The left main is a large vessel with no significant disease.  The LAD is a medium size vessel which courses three quarters of the way down the anterior wall and has no significant disease. There are two small diagonal branches with no significant disease.  The left circumflex is a large vessel coursing through the AV groove. The third obtuse marginal Vernel Langenderfer is free of the AV groove circumflex and noted to have a mild 30% narrowing after the takeoff the second OM.  The first OM is a small vessel which bifurcates in the proximal portion with no significant disease.  The second OM is a large vessel which bifurcates distally and has 50% mid vessel stenosis.  The third OM is a small vessel with no significant disease.  The right coronary artery is a large vessel which is dominant and gives rise to both the PDA as well as the posterolateral Chen Saadeh. The RCA is ectatic but has no high grade stenosis.  The PDA is a medium size vessel with no significant disease.  The PLA is a large vessel which bifurcates in the mid segment. There is 40% lesion in the proximal portion fo the lower bifurcation.  Left ventriculogram reveals a preserved ejection fraction of 60%.  Hemodynamics: The systemic arterial pressure was 112/69. Left ventricular pressure was 115/8. Left ventricular end diastolic pressure was 13.  CONCLUSION: 1. Non-critical coronary artery disease. 2. Normal left ventricular systolic function.   09/2012 Stress echo Study Conclusions  - Stress: There was resting hypertension with a hypertensive response to stress. Atypical chest pain was present before and during exercise without increase in intensity, and it persisted  following conclusion of the test. - Stress ECG conclusions: No diagnostic ST abnormalities or arrhythmias. The stress ECG was negative for ischemia. Duke scoring: exercise time of 20min; maximum ST deviation of 53mm; angina present but did not limit exercise; resulting score is -1. This score predicts a moderate risk of cardiac events. - Staged echo: There was no echocardiographic evidence for stress-induced ischemia. - Peak stress: LV global systolic function was vigorous. The estimated LV ejection fraction was 70% to 75%. No  evidence for new LV regional wall motion abnormalities.   07/2015 Nuclear stress test  There was no ST segment deviation noted during stress.  Defect 1: There is a medium defect of mild severity present in the basal inferior, mid inferior and apical inferior location, which is likely due to soft tissue attenuation artifact. A mild degree of myocardial scar cannot entirely be ruled out.  This is a low risk study.  Nuclear stress EF: 62%.      Assessment and Plan  1.Chest pain - long history of atypical chest pain, symptoms overall unchanged over several years - negative ischemic evaluations in the past including cath in 2005, and most recently a lexiscan negative for ischemia 07/2015 - continue to monitor at this time  2. HTN - bp at goal, continue current meds  3. HL Stop simva, coninue atorva. Request pcp labs        Arnoldo Lenis, M.D.

## 2018-04-01 NOTE — Patient Instructions (Addendum)
Medication Instructions:   Your physician has recommended you make the following change in your medication:   Stop simvastatin  Continue all other medications the same.  Labwork:  NONE  Testing/Procedures:  NONE  Follow-Up:  Your physician recommends that you schedule a follow-up appointment in: 1 year. You will receive a reminder letter in the mail in about 10 months reminding you to call and schedule your appointment. If you don't receive this letter, please contact our office.  Any Other Special Instructions Will Be Listed Below (If Applicable).  If you need a refill on your cardiac medications before your next appointment, please call your pharmacy.

## 2018-04-06 ENCOUNTER — Encounter: Payer: Self-pay | Admitting: Cardiology

## 2018-05-19 ENCOUNTER — Other Ambulatory Visit: Payer: Self-pay

## 2018-05-19 ENCOUNTER — Encounter (HOSPITAL_COMMUNITY): Payer: Self-pay | Admitting: Emergency Medicine

## 2018-05-19 ENCOUNTER — Emergency Department (HOSPITAL_COMMUNITY)
Admission: EM | Admit: 2018-05-19 | Discharge: 2018-05-19 | Disposition: A | Discharge status: Home / Self Care | Payer: Medicare HMO | Attending: Emergency Medicine | Admitting: Emergency Medicine

## 2018-05-19 DIAGNOSIS — Y9389 Activity, other specified: Secondary | ICD-10-CM | POA: Insufficient documentation

## 2018-05-19 DIAGNOSIS — S91331A Puncture wound without foreign body, right foot, initial encounter: Secondary | ICD-10-CM | POA: Insufficient documentation

## 2018-05-19 DIAGNOSIS — Z23 Encounter for immunization: Secondary | ICD-10-CM | POA: Diagnosis not present

## 2018-05-19 DIAGNOSIS — F17210 Nicotine dependence, cigarettes, uncomplicated: Secondary | ICD-10-CM | POA: Insufficient documentation

## 2018-05-19 DIAGNOSIS — Z79899 Other long term (current) drug therapy: Secondary | ICD-10-CM | POA: Insufficient documentation

## 2018-05-19 DIAGNOSIS — Y92003 Bedroom of unspecified non-institutional (private) residence as the place of occurrence of the external cause: Secondary | ICD-10-CM | POA: Diagnosis not present

## 2018-05-19 DIAGNOSIS — I10 Essential (primary) hypertension: Secondary | ICD-10-CM | POA: Insufficient documentation

## 2018-05-19 DIAGNOSIS — W5911XA Bitten by nonvenomous snake, initial encounter: Secondary | ICD-10-CM | POA: Diagnosis not present

## 2018-05-19 DIAGNOSIS — Y999 Unspecified external cause status: Secondary | ICD-10-CM | POA: Diagnosis not present

## 2018-05-19 DIAGNOSIS — J449 Chronic obstructive pulmonary disease, unspecified: Secondary | ICD-10-CM | POA: Diagnosis not present

## 2018-05-19 DIAGNOSIS — Z7982 Long term (current) use of aspirin: Secondary | ICD-10-CM | POA: Diagnosis not present

## 2018-05-19 MED ORDER — ACETAMINOPHEN 325 MG PO TABS
650.0000 mg | ORAL_TABLET | Freq: Once | ORAL | Status: AC
  Administered 2018-05-19: 650 mg via ORAL
  Filled 2018-05-19: qty 2

## 2018-05-19 MED ORDER — TETANUS-DIPHTH-ACELL PERTUSSIS 5-2.5-18.5 LF-MCG/0.5 IM SUSP
0.5000 mL | Freq: Once | INTRAMUSCULAR | Status: AC
  Administered 2018-05-19: 0.5 mL via INTRAMUSCULAR
  Filled 2018-05-19: qty 0.5

## 2018-05-19 NOTE — ED Triage Notes (Signed)
Pt states he was bitten by a snake 2 times. Once in the right ankle and once in the left ankle. Pt did bring snake with him. No swelling and inflammation to the area.

## 2018-05-19 NOTE — Discharge Instructions (Signed)
Keep the wounds clean and dry, wash with soap and water daily.  Recheck if they get more painful, more red, more swollen, draining blood or draining pus.

## 2018-05-19 NOTE — ED Provider Notes (Signed)
[facility] EMERGENCY DEPARTMENT Provider Note   CSN: [id number] Arrival date & time: 05/19/18  0142  Time seen 02:00 AM   History   Chief Complaint Chief Complaint  Patient presents with  . Snake Bite    HPI Trevor Berg is a 67 y.o. male.  HPI patient states they are moving.  He was in his daughter's bedroom removing a small refrigerator off of a cabinet and when he moved it a large black snake fell down and bit him twice.  It bit him on the top of his right foot and near his left heel.  This happened about 1 AM.  He initially told me he was having a little pain then he said he was having a lot of pain.  He states he has some tingling in the area.  Last tetanus is unknown.  PCP Jani Gravel, MD   Past Medical History:  Diagnosis Date  . Anginal pain ([place])   . Anxiety   . Asthma   . Chest pain 2005   noncritical coronary disease in 2005: 40% distal RCA, 30% CX, 50% OM 2, EF-60%,  . COPD (chronic obstructive pulmonary disease) (HCC)    Chronic bronchitis; 100-pack-year cigarette consumption  . Gastroesophageal reflux disease    possible small Schatzki's ring; esophageal dilatation in 2005  . H. pylori infection 2014   treated with prevpac  . Hyperlipidemia   . Hypertension   . Overweight(278.02)   . Tobacco abuse    100 pack years    Patient Active Problem List   Diagnosis Date Noted  . S/P right knee arthroscopy 12/06/17 12/13/2017  . Derang of medial meniscus due to old tear/inj, right knee   . Loose body in knee, right knee   . Mucosal abnormality of stomach   . Dysphagia   . History of colonic polyps   . Diverticulosis of colon without hemorrhage   . Essential hypertension 02/23/2014  . COPD (chronic obstructive pulmonary disease) ([place]) 11/07/2013  . COPD exacerbation ([place]) 11/06/2013  . Pneumonia and influenza 11/06/2013  . Cough with sputum 05/07/2013  . Esophageal dysphagia 02/25/2013  . Chronic constipation 02/25/2013  . Abdominal pain, chronic,  epigastric 02/25/2013  . Chest pain   . Tobacco abuse     Past Surgical History:  Procedure Laterality Date  . CERVICAL SPINE SURGERY     C5-6 and C6-7: Relief of spinal stenosis with corpectomy, foraminotomy and fusion  . COLONOSCOPY  10/19/2004   RMR:   Friable anal canal, likely source of patient's hematochezia/  Otherwise normal rectum, normal colon.  . COLONOSCOPY N/A 09/09/2015   Procedure: COLONOSCOPY;  Surgeon: Daneil Dolin, MD;  [facility];  Service: Endoscopy;  Laterality: N/A;  1015  . ESOPHAGEAL DILATION N/A 09/09/2015   Procedure: ESOPHAGEAL DILATION;  Surgeon: Daneil Dolin, MD;  [facility];  Service: Endoscopy;  Laterality: N/A;  . ESOPHAGOGASTRODUODENOSCOPY  09/27/2004   RMR:   A couple of tiny, distal esophageal erosions, otherwise normal esophagus aside from a Schatzki ring status post Maloney dilation as described above/ Small hiatal hernia, otherwise normal stomach, normal D1-D2  . ESOPHAGOGASTRODUODENOSCOPY N/A 09/09/2015   Procedure: ESOPHAGOGASTRODUODENOSCOPY (EGD);  Surgeon: Daneil Dolin, MD;  [facility];  Service: Endoscopy;  Laterality: N/A;  . ESOPHAGOGASTRODUODENOSCOPY N/A 04/19/2017   Procedure: ESOPHAGOGASTRODUODENOSCOPY (EGD);  Surgeon: Daneil Dolin, MD;  [facility];  Service: Endoscopy;  Laterality: N/A;  730   . ESOPHAGOGASTRODUODENOSCOPY (EGD) WITH  ESOPHAGEAL DILATION N/A 03/19/2013   [id number]:[id number] esophagus s/p dilation/Abnormal stomach of uncertain clinical significance-s/p bx positive for H. pylori, status post Prevpac treatment  . KNEE ARTHROSCOPY WITH MEDIAL MENISECTOMY Right 12/06/2017   Procedure: RIGHT KNEE ARTHROSCOPY WITH PARTIAL MEDIAL MENISECTOMY; REMOVAL OF LOOSE BODY;  Surgeon: Carole Civil, MD;  Location: [facility];  Service: Orthopedics;  Laterality: Right;  . MALONEY DILATION N/A 04/19/2017   Procedure: Venia Minks DILATION;  Surgeon: Daneil Dolin, MD;  [facility];   Service: Endoscopy;  Laterality: N/A;  . ORIF ANKLE FRACTURE     right  . ORIF TIBIA FRACTURE     Left  . TONSILLECTOMY          Home Medications    Prior to Admission medications   Medication Sig Start Date End Date Taking? Authorizing Provider  albuterol (PROVENTIL HFA;VENTOLIN HFA) 108 (90 BASE) MCG/ACT inhaler Inhale 2 puffs into the lungs every 6 (six) hours as needed for shortness of breath.     [provider]  amLODipine (NORVASC) 2.5 MG tablet Take 2.5 mg by mouth daily.    [provider]  ANORO ELLIPTA 62.5-25 MCG/INH AEPB Inhale 1 puff into the lungs daily.  03/08/15   [provider]  aspirin EC 81 MG tablet Take 81 mg by mouth daily.    [provider]  atenolol (TENORMIN) 50 MG tablet TAKE ONE TABLET BY MOUTH ONCE DAILY IN THE MORNING 07/27/16   Arnoldo Lenis, MD  atorvastatin (LIPITOR) 20 MG tablet Take 20 mg by mouth at bedtime.    [provider]  DEXILANT 60 MG capsule Take 60 mg by mouth daily.  01/28/14   [provider]  dextromethorphan-guaiFENesin (MUCINEX DM) 30-600 MG 12hr tablet Take 1 tablet by mouth every 12 (twelve) hours.     [provider]  hydrochlorothiazide (MICROZIDE) 12.5 MG capsule TAKE 1 CAPSULE BY MOUTH ONCE A DAY FOR HTN/SWELLING 06/29/17   [provider]  ibuprofen (ADVIL,MOTRIN) 800 MG tablet Take 800 mg by mouth 3 (three) times daily as needed for moderate pain.  04/26/15   [provider]  [name] 290 MCG CAPS capsule Take 1 capsule (290 mcg total) by mouth daily. 10/18/17   Mahala Menghini, PA-C  lisinopril (PRINIVIL,ZESTRIL) 40 MG tablet Take 1 tablet (40 mg total) by mouth daily. 11/06/16   Arnoldo Lenis, MD  LORazepam (ATIVAN) 1 MG tablet Take 1 mg by mouth 3 (three) times daily as needed for anxiety.     [provider]  Naphazoline HCl (CLEAR EYES OP) Apply 1 drop to eye daily as needed (dry eyes).    [provider]  nitroGLYCERIN  (NITROSTAT) 0.4 MG SL tablet Place 0.4 mg under the tongue every 5 (five) minutes as needed for chest pain. Chest Pain    [provider]  oxyCODONE-acetaminophen (PERCOCET/ROXICET) 5-325 MG tablet Take 1 tablet by mouth every 4 (four) hours as needed for severe pain. 12/06/17   Carole Civil, MD  pantoprazole (PROTONIX) 40 MG tablet Take 40 mg by mouth daily.    [provider]  polyethylene glycol powder (GLYCOLAX/MIRALAX) powder USE 1 CAPFUL DAILY AS NEEDED FOR CONSTIPATION Patient taking differently: USE 1 CAPFUL DAILY 11/08/16   Annitta Needs, NP  sertraline (ZOLOFT) 50 MG tablet Take 50 mg by mouth daily.    [provider]  traZODone (DESYREL) 50 MG tablet Take 50 mg by mouth at bedtime. 04/09/17   [provider]  vitamin B-12 (CYANOCOBALAMIN) 1000 MCG tablet Take 2,000 mcg by mouth daily.     [provider]    Family History Family History  Problem Relation Age of Onset  . Heart attack Mother        deceased, age 75s  . Ulcers Brother   . Diabetes Brother   . Hypertension Unknown   . Colon cancer Neg Hx     Social History Social History   Tobacco Use  . Smoking status: Current Every Day Smoker    Packs/day: 1.50    Years: 50.00    Pack years: 75.00    Types: Cigarettes  . Smokeless tobacco: Never Used  . Tobacco comment: 1 pack per day as of 08/2012  Substance Use Topics  . Alcohol use: No    Alcohol/week: 0.0 oz    Comment: occasionally, usually holidays but sometimes on weekends, prior heavy use  . Drug use: No  smokes 2 ppd Lives with family   Allergies   Chantix [varenicline] and Tomato   Review of Systems Review of Systems  All other systems reviewed and are negative.    Physical Exam Updated Vital Signs BP 110/62 (BP Location: Right Arm)   Pulse 66   Resp 16   Wt 83.5 kg (184 lb)   SpO2 96%   BMI 29.70 kg/m    Vital signs normal    Physical Exam  Constitutional: He is oriented to person,  place, and time. He appears well-developed and well-nourished.  HENT:  Head: Normocephalic and atraumatic.  Right Ear: External ear normal.  Left Ear: External ear normal.  Nose: Nose normal.  Eyes: Conjunctivae and EOM are normal.  Neck: Normal range of motion.  Cardiovascular: Normal rate.  Pulmonary/Chest: Effort normal. No respiratory distress.  Musculoskeletal: Normal range of motion. He exhibits no edema.  Patient has 2 puncture wounds on the dorsum of his right foot anterior to the ankle without redness, swelling, bleeding, or drainage. He has 2 puncture wounds over the left heel laterally without swelling, redness, bleeding, or drainage.  Neurological: He is alert and oriented to person, place, and time. No cranial nerve deficit.  Skin: Skin is warm and dry. Capillary refill takes less than 2 seconds. No erythema.  Psychiatric: He has a normal mood and affect. His behavior is normal. Thought content normal.  Nursing note and vitals reviewed.    Right foot    Left foot     ED Treatments / Results  Labs (all labs ordered are listed, but only abnormal results are displayed) Labs Reviewed - No data to display  EKG None  Radiology No results found.  Procedures Procedures (including critical care time)      Medications Ordered in ED Medications  acetaminophen (TYLENOL) tablet 650 mg (650 mg Oral Given 05/19/18 0244)  Tdap (BOOSTRIX) injection 0.5 mL (0.5 mLs Intramuscular Given 05/19/18 0244)     Initial Impression / Assessment and Plan / ED Course  I have reviewed the triage vital signs and the nursing notes.  Pertinent labs & imaging results that were available during my care of the patient were reviewed by me and considered in my medical decision making (see chart for details).     Daughter states she smashed the snake in the head with a hammer.  They have the snake and a large glass pickle bottle.  It appears to be a black snake, it is a large snake.   It is obviously not a copperhead.  I would  not suspect a water moccasin to be staying in the house.  Patient's tetanus status was updated.  2:30 AM I talked to poison control.  They agree with updating his tetanus.  They state they do not recommend prophylactic antibiotics or steroids.  They just state localized wound care and have him rechecked if it gets infected.  Recheck at 3:15 AM patient still just has the puncture wounds, there is no swelling, redness, bleeding, or drainage.  I reexamined the snake, there is no pit noted on his head.  A do not suspect this is a water moccasin but it is just a plain black snake.  Final Clinical Impressions(s) / ED Diagnoses   Final diagnoses:  Snake bite, initial encounter    ED Discharge Orders    None      Plan discharge  Rolland Porter, MD, Barbette Or, MD 05/19/18 [phone]

## 2018-06-27 ENCOUNTER — Other Ambulatory Visit: Payer: Self-pay

## 2018-06-27 ENCOUNTER — Emergency Department (HOSPITAL_COMMUNITY)
Admission: EM | Admit: 2018-06-27 | Discharge: 2018-06-28 | Disposition: A | Discharge status: Home / Self Care | Payer: Medicare HMO | Attending: Emergency Medicine | Admitting: Emergency Medicine

## 2018-06-27 ENCOUNTER — Encounter (HOSPITAL_COMMUNITY): Payer: Self-pay | Admitting: *Deleted

## 2018-06-27 DIAGNOSIS — I10 Essential (primary) hypertension: Secondary | ICD-10-CM | POA: Diagnosis not present

## 2018-06-27 DIAGNOSIS — J449 Chronic obstructive pulmonary disease, unspecified: Secondary | ICD-10-CM | POA: Insufficient documentation

## 2018-06-27 DIAGNOSIS — Z7982 Long term (current) use of aspirin: Secondary | ICD-10-CM | POA: Insufficient documentation

## 2018-06-27 DIAGNOSIS — Z79899 Other long term (current) drug therapy: Secondary | ICD-10-CM | POA: Diagnosis not present

## 2018-06-27 DIAGNOSIS — L0231 Cutaneous abscess of buttock: Secondary | ICD-10-CM | POA: Insufficient documentation

## 2018-06-27 DIAGNOSIS — F17210 Nicotine dependence, cigarettes, uncomplicated: Secondary | ICD-10-CM | POA: Diagnosis not present

## 2018-06-27 DIAGNOSIS — N50811 Right testicular pain: Secondary | ICD-10-CM | POA: Diagnosis not present

## 2018-06-27 DIAGNOSIS — N451 Epididymitis: Secondary | ICD-10-CM | POA: Diagnosis not present

## 2018-06-27 LAB — CBC WITH DIFFERENTIAL/PLATELET
Basophils Absolute: 0 10*3/uL (ref 0.0–0.1)
Basophils Relative: 0 %
Eosinophils Absolute: 0.3 10*3/uL (ref 0.0–0.7)
Eosinophils Relative: 2 %
HCT: 33.1 % — ABNORMAL LOW (ref 39.0–52.0)
Hemoglobin: 10.9 g/dL — ABNORMAL LOW (ref 13.0–17.0)
Lymphocytes Relative: 15 %
Lymphs Abs: 2.3 10*3/uL (ref 0.7–4.0)
MCH: 31.1 pg (ref 26.0–34.0)
MCHC: 32.9 g/dL (ref 30.0–36.0)
MCV: 94.3 fL (ref 78.0–100.0)
Monocytes Absolute: 1.3 10*3/uL — ABNORMAL HIGH (ref 0.1–1.0)
Monocytes Relative: 8 %
Neutro Abs: 12.1 10*3/uL — ABNORMAL HIGH (ref 1.7–7.7)
Neutrophils Relative %: 75 %
Platelets: 395 10*3/uL (ref 150–400)
RBC: 3.51 MIL/uL — ABNORMAL LOW (ref 4.22–5.81)
RDW: 13.2 % (ref 11.5–15.5)
WBC: 16 10*3/uL — ABNORMAL HIGH (ref 4.0–10.5)

## 2018-06-27 LAB — URINALYSIS, ROUTINE W REFLEX MICROSCOPIC
Bilirubin Urine: NEGATIVE
Glucose, UA: NEGATIVE mg/dL
Hgb urine dipstick: NEGATIVE
Ketones, ur: NEGATIVE mg/dL
Nitrite: POSITIVE — AB
Protein, ur: NEGATIVE mg/dL
Specific Gravity, Urine: 1.016 (ref 1.005–1.030)
pH: 5 (ref 5.0–8.0)

## 2018-06-27 LAB — BASIC METABOLIC PANEL
Anion gap: 9 (ref 5–15)
BUN: 27 mg/dL — ABNORMAL HIGH (ref 8–23)
CO2: 24 mmol/L (ref 22–32)
Calcium: 9.1 mg/dL (ref 8.9–10.3)
Chloride: 100 mmol/L (ref 98–111)
Creatinine, Ser: 1.24 mg/dL (ref 0.61–1.24)
GFR calc Af Amer: >60 mL/min (ref >60)
GFR calc non Af Amer: 59 mL/min — ABNORMAL LOW (ref >60)
Glucose, Bld: 93 mg/dL (ref 70–99)
Potassium: 5 mmol/L (ref 3.5–5.1)
Sodium: 133 mmol/L — ABNORMAL LOW (ref 135–145)

## 2018-06-27 MED ORDER — SODIUM CHLORIDE 0.9 % IV SOLN
INTRAVENOUS | Status: DC
  Administered 2018-06-27: 21:00:00 via INTRAVENOUS

## 2018-06-27 MED ORDER — LEVOFLOXACIN 500 MG PO TABS: 500.0000 mg | ORAL_TABLET | Freq: Every day | ORAL | 0 refills | Status: DC

## 2018-06-27 MED ORDER — SODIUM CHLORIDE 0.9 % IV BOLUS
1000.0000 mL | Freq: Once | INTRAVENOUS | Status: AC
  Administered 2018-06-27: 1000 mL via INTRAVENOUS

## 2018-06-27 MED ORDER — KETOROLAC TROMETHAMINE 30 MG/ML IJ SOLN
15.0000 mg | Freq: Once | INTRAMUSCULAR | Status: AC
  Administered 2018-06-27: 15 mg via INTRAVENOUS
  Filled 2018-06-27: qty 1

## 2018-06-27 MED ORDER — OXYCODONE-ACETAMINOPHEN 5-325 MG PO TABS: 1.0000 | ORAL_TABLET | ORAL | 0 refills | Status: DC | PRN

## 2018-06-27 MED ORDER — LIDOCAINE-EPINEPHRINE (PF) 2 %-1:200000 IJ SOLN
10.0000 mL | Freq: Once | INTRAMUSCULAR | Status: AC
  Administered 2018-06-27: 10 mL
  Filled 2018-06-27: qty 20

## 2018-06-27 MED ORDER — CLINDAMYCIN PHOSPHATE 600 MG/50ML IV SOLN
600.0000 mg | Freq: Once | INTRAVENOUS | Status: AC
  Administered 2018-06-27: 600 mg via INTRAVENOUS
  Filled 2018-06-27: qty 50

## 2018-06-27 MED ORDER — LEVOFLOXACIN IN D5W 500 MG/100ML IV SOLN
500.0000 mg | Freq: Once | INTRAVENOUS | Status: AC
  Administered 2018-06-27: 500 mg via INTRAVENOUS
  Filled 2018-06-27: qty 100

## 2018-06-27 MED ORDER — LORAZEPAM 2 MG/ML IJ SOLN
1.0000 mg | Freq: Once | INTRAMUSCULAR | Status: AC
  Administered 2018-06-27: 1 mg via INTRAVENOUS
  Filled 2018-06-27: qty 1

## 2018-06-27 MED ORDER — HYDROMORPHONE HCL 1 MG/ML IJ SOLN
1.0000 mg | Freq: Once | INTRAMUSCULAR | Status: AC
  Administered 2018-06-27: 1 mg via INTRAVENOUS
  Filled 2018-06-27: qty 1

## 2018-06-27 MED ORDER — CLINDAMYCIN HCL 300 MG PO CAPS: 300.0000 mg | ORAL_CAPSULE | Freq: Three times a day (TID) | ORAL | 0 refills | Status: DC

## 2018-06-27 NOTE — Discharge Instructions (Addendum)
I will need to do sitz bath's a couple times a day for the next few days.  You are being prescribed 2 different antibiotics.  One is for the abscess.  The other one is for what I suspect is epididymitis.  This is inflammation of a structure next to your testicle.  I would like you to come for an ultrasound tomorrow to verify this though.  Take pain medicine as needed. You will need to follow-up with your PCP otherwise.

## 2018-06-27 NOTE — ED Triage Notes (Signed)
Pt c/o pain, swelling and redness to his scrotum x 2 weeks; pt also c/o a raised area to his rectum

## 2018-06-27 NOTE — ED Provider Notes (Signed)
[facility] EMERGENCY DEPARTMENT Provider Note   CSN: [id number] Arrival date & time: 06/27/18  2017     History   Chief Complaint Chief Complaint  Patient presents with  . Abscess    HPI Trevor Berg is a 67 y.o. male.  HPI  67 year old male with 2 complaints.  Primarily right buttock pain.  Reports a painful lesion there which she noticed several days ago and has progressively become more painful and larger in size.  No drainage.  No fevers or chills.  Is also complaining of right testicular pain.  This is been ongoing for approximately 2 weeks.  Fairly constant.  No urinary complaints.  No fevers or chills.  No discharge.  Past Medical History:  Diagnosis Date  . Anginal pain ([place])   . Anxiety   . Asthma   . Chest pain 2005   noncritical coronary disease in 2005: 40% distal RCA, 30% CX, 50% OM 2, EF-60%,  . COPD (chronic obstructive pulmonary disease) (HCC)    Chronic bronchitis; 100-pack-year cigarette consumption  . Gastroesophageal reflux disease    possible small Schatzki's ring; esophageal dilatation in 2005  . H. pylori infection 2014   treated with prevpac  . Hyperlipidemia   . Hypertension   . Overweight(278.02)   . Tobacco abuse    100 pack years    Patient Active Problem List   Diagnosis Date Noted  . S/P right knee arthroscopy 12/06/17 12/13/2017  . Derang of medial meniscus due to old tear/inj, right knee   . Loose body in knee, right knee   . Mucosal abnormality of stomach   . Dysphagia   . History of colonic polyps   . Diverticulosis of colon without hemorrhage   . Essential hypertension 02/23/2014  . COPD (chronic obstructive pulmonary disease) ([place]) 11/07/2013  . COPD exacerbation ([place]) 11/06/2013  . Pneumonia and influenza 11/06/2013  . Cough with sputum 05/07/2013  . Esophageal dysphagia 02/25/2013  . Chronic constipation 02/25/2013  . Abdominal pain, chronic, epigastric 02/25/2013  . Chest pain   . Tobacco abuse     Past Surgical  History:  Procedure Laterality Date  . CERVICAL SPINE SURGERY     C5-6 and C6-7: Relief of spinal stenosis with corpectomy, foraminotomy and fusion  . COLONOSCOPY  10/19/2004   RMR:   Friable anal canal, likely source of patient's hematochezia/  Otherwise normal rectum, normal colon.  . COLONOSCOPY N/A 09/09/2015   Procedure: COLONOSCOPY;  Surgeon: Daneil Dolin, MD;  [facility];  Service: Endoscopy;  Laterality: N/A;  1015  . ESOPHAGEAL DILATION N/A 09/09/2015   Procedure: ESOPHAGEAL DILATION;  Surgeon: Daneil Dolin, MD;  [facility];  Service: Endoscopy;  Laterality: N/A;  . ESOPHAGOGASTRODUODENOSCOPY  09/27/2004   RMR:   A couple of tiny, distal esophageal erosions, otherwise normal esophagus aside from a Schatzki ring status post Maloney dilation as described above/ Small hiatal hernia, otherwise normal stomach, normal D1-D2  . ESOPHAGOGASTRODUODENOSCOPY N/A 09/09/2015   Procedure: ESOPHAGOGASTRODUODENOSCOPY (EGD);  Surgeon: Daneil Dolin, MD;  [facility];  Service: Endoscopy;  Laterality: N/A;  . ESOPHAGOGASTRODUODENOSCOPY N/A 04/19/2017   Procedure: ESOPHAGOGASTRODUODENOSCOPY (EGD);  Surgeon: Daneil Dolin, MD;  [facility];  Service: Endoscopy;  Laterality: N/A;  730   . ESOPHAGOGASTRODUODENOSCOPY (EGD) WITH ESOPHAGEAL DILATION N/A 03/19/2013   [id number] esophagus s/p dilation/Abnormal stomach of uncertain clinical significance-s/p bx positive for H. pylori, status post Prevpac treatment  . KNEE ARTHROSCOPY WITH MEDIAL  MENISECTOMY Right 12/06/2017   Procedure: RIGHT KNEE ARTHROSCOPY WITH PARTIAL MEDIAL MENISECTOMY; REMOVAL OF LOOSE BODY;  Surgeon: Carole Civil, MD;  Location: [facility];  Service: Orthopedics;  Laterality: Right;  . MALONEY DILATION N/A 04/19/2017   Procedure: Venia Minks DILATION;  Surgeon: Daneil Dolin, MD;  [facility];  Service: Endoscopy;  Laterality: N/A;  . ORIF ANKLE FRACTURE     right  . ORIF  TIBIA FRACTURE     Left  . TONSILLECTOMY          Home Medications    Prior to Admission medications   Medication Sig Start Date End Date Taking? Authorizing Provider  albuterol (PROVENTIL HFA;VENTOLIN HFA) 108 (90 BASE) MCG/ACT inhaler Inhale 2 puffs into the lungs every 6 (six) hours as needed for shortness of breath.     [provider]  amLODipine (NORVASC) 2.5 MG tablet Take 2.5 mg by mouth daily.    [provider]  ANORO ELLIPTA 62.5-25 MCG/INH AEPB Inhale 1 puff into the lungs daily.  03/08/15   [provider]  aspirin EC 81 MG tablet Take 81 mg by mouth daily.    [provider]  atenolol (TENORMIN) 50 MG tablet TAKE ONE TABLET BY MOUTH ONCE DAILY IN THE MORNING 07/27/16   Arnoldo Lenis, MD  atorvastatin (LIPITOR) 20 MG tablet Take 20 mg by mouth at bedtime.    [provider]  clindamycin (CLEOCIN) 300 MG capsule Take 1 capsule (300 mg total) by mouth 3 (three) times daily. 06/27/18   Virgel Manifold, MD  DEXILANT 60 MG capsule Take 60 mg by mouth daily.  01/28/14   [provider]  dextromethorphan-guaiFENesin (MUCINEX DM) 30-600 MG 12hr tablet Take 1 tablet by mouth every 12 (twelve) hours.     [provider]  hydrochlorothiazide (MICROZIDE) 12.5 MG capsule TAKE 1 CAPSULE BY MOUTH ONCE A DAY FOR HTN/SWELLING 06/29/17   [provider]  ibuprofen (ADVIL,MOTRIN) 800 MG tablet Take 800 mg by mouth 3 (three) times daily as needed for moderate pain.  04/26/15   [provider]  levofloxacin (LEVAQUIN) 500 MG tablet Take 1 tablet (500 mg total) by mouth daily. 06/27/18   Virgel Manifold, MD  [name] 290 MCG CAPS capsule Take 1 capsule (290 mcg total) by mouth daily. 10/18/17   Mahala Menghini, PA-C  lisinopril (PRINIVIL,ZESTRIL) 40 MG tablet Take 1 tablet (40 mg total) by mouth daily. 11/06/16   Arnoldo Lenis, MD  LORazepam (ATIVAN) 1 MG tablet Take 1 mg by mouth 3 (three) times daily as needed for  anxiety.     [provider]  Naphazoline HCl (CLEAR EYES OP) Apply 1 drop to eye daily as needed (dry eyes).    [provider]  nitroGLYCERIN (NITROSTAT) 0.4 MG SL tablet Place 0.4 mg under the tongue every 5 (five) minutes as needed for chest pain. Chest Pain    [provider]  oxyCODONE-acetaminophen (PERCOCET/ROXICET) 5-325 MG tablet Take 1 tablet by mouth every 4 (four) hours as needed for severe pain. 12/06/17   Carole Civil, MD  oxyCODONE-acetaminophen (PERCOCET/ROXICET) 5-325 MG tablet Take 1-2 tablets by mouth every 4 (four) hours as needed. 06/27/18   Virgel Manifold, MD  pantoprazole (PROTONIX) 40 MG tablet Take 40 mg by mouth daily.    [provider]  polyethylene glycol powder (GLYCOLAX/MIRALAX) powder USE 1 CAPFUL DAILY AS NEEDED FOR CONSTIPATION Patient taking differently: USE 1 CAPFUL DAILY 11/08/16   Annitta Needs, NP  sertraline (ZOLOFT) 50 MG tablet Take 50 mg by mouth daily.    [provider]  traZODone (DESYREL) 50 MG tablet Take 50 mg by mouth at bedtime. 04/09/17   [provider]  vitamin B-12 (CYANOCOBALAMIN) 1000 MCG tablet Take 2,000 mcg by mouth daily.     [provider]    Family History Family History  Problem Relation Age of Onset  . Heart attack Mother        deceased, age 81s  . Ulcers Brother   . Diabetes Brother   . Hypertension Unknown   . Colon cancer Neg Hx     Social History Social History   Tobacco Use  . Smoking status: Current Every Day Smoker    Packs/day: 1.50    Years: 50.00    Pack years: 75.00    Types: Cigarettes  . Smokeless tobacco: Never Used  . Tobacco comment: 1 pack per day as of 08/2012  Substance Use Topics  . Alcohol use: No    Alcohol/week: 0.0 standard drinks    Comment: occasionally, usually holidays but sometimes on weekends, prior heavy use  . Drug use: No     Allergies   Chantix [varenicline] and Tomato   Review of Systems Review of  Systems  All systems reviewed and negative, other than as noted in HPI.   Physical Exam Updated Vital Signs BP 115/68   Pulse 73   Temp (!) 97.4 F (36.3 C) (Oral)   Resp (!) 22   Ht 5\' 6"  (1.676 m)   Wt 80.7 kg   SpO2 95%   BMI 28.73 kg/m   Physical Exam  Constitutional: He appears well-developed and well-nourished. No distress.  HENT:  Head: Normocephalic and atraumatic.  Eyes: Conjunctivae are normal. Right eye exhibits no discharge. Left eye exhibits no discharge.  Neck: Neck supple.  Cardiovascular: Normal rate, regular rhythm and normal heart sounds. Exam reveals no gallop and no friction rub.  No murmur heard. Pulmonary/Chest: Effort normal and breath sounds normal. No respiratory distress.  Abdominal: Soft. He exhibits no distension. There is no tenderness.  Genitourinary:  Genitourinary Comments: Right testicle is diffusely tender.  No scrotal edema.  No overlying skin changes.  No inguinal adenopathy.  His right buttock.  Flexion.  No drainage.  Some surrounding induration or perhaps some mild cellulitis.  Does not extend to the anus.  Musculoskeletal: He exhibits no edema or tenderness.  Neurological: He is alert.  Skin: Skin is warm and dry.  Psychiatric: He has a normal mood and affect. His behavior is normal. Thought content normal.  Nursing note and vitals reviewed.    ED Treatments / Results  Labs (all labs ordered are listed, but only abnormal results are displayed) Labs Reviewed  CBC WITH DIFFERENTIAL/PLATELET - Abnormal; Notable for the following components:      Result Value   WBC 16.0 (*)    RBC 3.51 (*)    Hemoglobin 10.9 (*)    HCT 33.1 (*)    Neutro Abs 12.1 (*)    Monocytes Absolute 1.3 (*)    All other components within normal limits  BASIC METABOLIC PANEL - Abnormal; Notable for the following components:   Sodium 133 (*)    BUN 27 (*)    GFR calc non Af Amer 59 (*)    All other components within normal limits  URINALYSIS, ROUTINE W  REFLEX MICROSCOPIC - Abnormal; Notable for the following components:   Nitrite POSITIVE (*)    Leukocytes,  UA TRACE (*)    Bacteria, UA FEW (*)    All other components within normal limits  URINE CULTURE    EKG None  Radiology No results found.  Procedures Procedures (including critical care time)  INCISION AND DRAINAGE Performed by: Virgel Manifold Consent: Verbal consent obtained. Risks and benefits: risks, benefits and alternatives were discussed Type: abscess  Body area: Buttock Anesthesia: local infiltration  Incision was made with a scalpel.  Local anesthetic: lidocaine   Anesthetic total: 3 ml  Complexity: complex Blunt dissection to break up loculations  Drainage: purulent  Drainage amount: Large   Patient tolerance: Patient tolerated the procedure well with no immediate complications.     Medications Ordered in ED Medications  0.9 %  sodium chloride infusion ( Intravenous Stopped 06/27/18 2357)  lidocaine-EPINEPHrine (XYLOCAINE W/EPI) 2 %-1:200000 (PF) injection 10 mL (10 mLs Infiltration Given 06/27/18 2357)  clindamycin (CLEOCIN) IVPB 600 mg (0 mg Intravenous Stopped 06/27/18 2224)  LORazepam (ATIVAN) injection 1 mg (1 mg Intravenous Given 06/27/18 2117)  HYDROmorphone (DILAUDID) injection 1 mg (1 mg Intravenous Given 06/27/18 2119)  ketorolac (TORADOL) 30 MG/ML injection 15 mg (15 mg Intravenous Given 06/27/18 2125)  sodium chloride 0.9 % bolus 1,000 mL (0 mLs Intravenous Stopped 06/27/18 2229)  levofloxacin (LEVAQUIN) IVPB 500 mg (0 mg Intravenous Stopped 06/27/18 2357)     Initial Impression / Assessment and Plan / ED Course  I have reviewed the triage vital signs and the nursing notes.  Pertinent labs & imaging results that were available during my care of the patient were reviewed by me and considered in my medical decision making (see chart for details).     [phone] with buttock abscess and scrotal pain.  Clinically unrelated.  Abscess was incised  and drained.  Will place him on clindamycin given the size of it.  .  Scrotal pain for 2 weeks.  Clinic suspect epididymitis.  Will prescribe Levaquin.  I doubt testicular torsion.  Testicle would be nonviable at this point anyways given constant pain for two weeks.   I have reviewed the triage vital signs and the nursing notes. Prior records were reviewed for additional information.    Pertinent labs & imaging results that were available during my care of the patient were reviewed by me and considered in my medical decision making (see chart for details).   Final Clinical Impressions(s) / ED Diagnoses   Final diagnoses:  Left buttock abscess  Epididymitis    ED Discharge Orders         Ordered    clindamycin (CLEOCIN) 300 MG capsule  3 times daily     06/27/18 2336    levofloxacin (LEVAQUIN) 500 MG tablet  Daily     06/27/18 2336    oxyCODONE-acetaminophen (PERCOCET/ROXICET) 5-325 MG tablet  Every 4 hours PRN     06/27/18 2336    US Scrotum     06/27/18 2336    US SCROTUM DOPPLER     06/27/18 2336           Virgel Manifold, MD 07/05/18 1319

## 2018-06-28 ENCOUNTER — Ambulatory Visit (HOSPITAL_COMMUNITY)
Admission: RE | Admit: 2018-06-28 | Discharge: 2018-06-28 | Disposition: A | Discharge status: Home / Self Care | Payer: Medicare HMO | Source: Ambulatory Visit | Attending: Emergency Medicine | Admitting: Emergency Medicine

## 2018-06-30 LAB — URINE CULTURE: Culture: >=100000 — AB

## 2018-07-01 ENCOUNTER — Telehealth: Payer: Self-pay | Admitting: Emergency Medicine

## 2018-07-01 NOTE — Telephone Encounter (Signed)
Post ED Visit - Positive Culture Follow-up  Culture report reviewed by antimicrobial stewardship pharmacist:  []  [name], Pharm.D. []  [name], Pharm.D., BCPS AQ-ID []  [name], Pharm.D., BCPS []  [name], Pharm.D., BCPS []  [name], Pharm.D., BCPS, AAHIVP []  [name], Pharm.D., BCPS, AAHIVP []  [name], PharmD, BCPS []  [name], PharmD, BCPS []  [name], PharmD, BCPS [x]  [name], PharmD  Positive urine culture Treated with clindamycin and levofloxacin, organism sensitive to the same and no further patient follow-up is required at this time.  [name] 07/01/2018, 10:09 AM

## 2018-08-07 ENCOUNTER — Ambulatory Visit: Payer: Medicare HMO | Admitting: Orthopedic Surgery

## 2018-08-07 ENCOUNTER — Ambulatory Visit (INDEPENDENT_AMBULATORY_CARE_PROVIDER_SITE_OTHER): Payer: Medicare HMO

## 2018-08-07 ENCOUNTER — Encounter: Payer: Self-pay | Admitting: Orthopedic Surgery

## 2018-08-07 VITALS — BP 114/66 | HR 68 | Ht 66.0 in | Wt 164.0 lb

## 2018-08-07 DIAGNOSIS — Z9889 Other specified postprocedural states: Secondary | ICD-10-CM | POA: Diagnosis not present

## 2018-08-07 DIAGNOSIS — M1711 Unilateral primary osteoarthritis, right knee: Secondary | ICD-10-CM

## 2018-08-07 NOTE — Progress Notes (Signed)
Chief Complaint  Patient presents with  . Knee Pain   67 year old male status post knee arthroscopy comes in with relatively minimal complaints in his right knee.  He has some unexplained weight loss is due to see his primary care physician next week as well as see the gastroenterologist      Review of Systems  Constitutional: Positive for weight loss. Negative for chills, fever and malaise/fatigue.     Past Medical History:  Diagnosis Date  . Anginal pain ([place])   . Anxiety   . Asthma   . Chest pain 2005   noncritical coronary disease in 2005: 40% distal RCA, 30% CX, 50% OM 2, EF-60%,  . COPD (chronic obstructive pulmonary disease) (HCC)    Chronic bronchitis; 100-pack-year cigarette consumption  . Gastroesophageal reflux disease    possible small Schatzki's ring; esophageal dilatation in 2005  . H. pylori infection 2014   treated with prevpac  . Hyperlipidemia   . Hypertension   . Overweight(278.02)   . Tobacco abuse    100 pack years   Allergies  Allergen Reactions  . Chantix [Varenicline]     Nightmares and personality changes.  . Tomato Rash    BP 114/66   Pulse 68   Ht 5\' 6"  (1.676 m)   Wt 164 lb (74.4 kg)   BMI 26.47 kg/m   Physical Exam  Constitutional: He is oriented to person, place, and time. He appears well-developed and well-nourished.  Vital signs have been reviewed and are stable. Gen. appearance the patient is well-developed and well-nourished with normal grooming and hygiene.   Musculoskeletal:       Right knee: He exhibits normal range of motion, no swelling, no effusion, no ecchymosis, no deformity, no laceration, no erythema, normal alignment, no LCL laxity and no MCL laxity. No tenderness found.  Neurological: He is alert and oriented to person, place, and time.  Skin: Skin is warm and dry. No erythema.  Psychiatric: He has a normal mood and affect.  Vitals reviewed.  X-rays were taken today he has mild arthritis primarily in the  patellofemoral joint mild in the tibiofemoral joint  Operative report 12/06/2017  12:30 PM  PATIENT:  Trevor Berg  67 y.o. male  PRE-OPERATIVE DIAGNOSIS:  medial meniscus tear and osteoarthritis right knee  POST-OPERATIVE DIAGNOSIS: SAME  PROCEDURE:  Procedure(s): RIGHT KNEE ARTHROSCOPY WITH PARTIAL MEDIAL MENISECTOMY; REMOVAL OF LOOSE BODY (Right)   Medial meniscus tear loose body osteoarthritis right knee  Surgical findings torn posterior horn medial meniscus grade III chondromalacia medial femoral condyle extensive synovitis suprapatellar pouch  ACL PCL intact  Lateral compartment normal  He is doing very well at this point I emphasized the fact that he needs to see his primary care physician about the weight loss.  He says his appetite is fine  He will see [place] on an as-needed basis for his knee  We encouraged him to take Tylenol and use topical creams for his pain relief when he does have knee pain

## 2018-10-01 ENCOUNTER — Other Ambulatory Visit: Payer: Self-pay | Admitting: Gastroenterology

## 2019-01-17 IMAGING — US US SCROTUM W/ DOPPLER COMPLETE
1 series · 14 of 25 positions shown · non-contrast
Comparison: None.

CLINICAL DATA: Right testicular pain.

EXAM:
SCROTAL ULTRASOUND
DOPPLER ULTRASOUND OF THE TESTICLES
TECHNIQUE: Complete ultrasound examination of the testicles, epididymis, and
other scrotal structures was performed. Color and spectral Doppler
ultrasound were also utilized to evaluate blood flow to the
testicles.

[Series 1: us scrotum w/ doppler complete · 0.06mm/px · 14 of 59 slices shown]
[im 1/59]
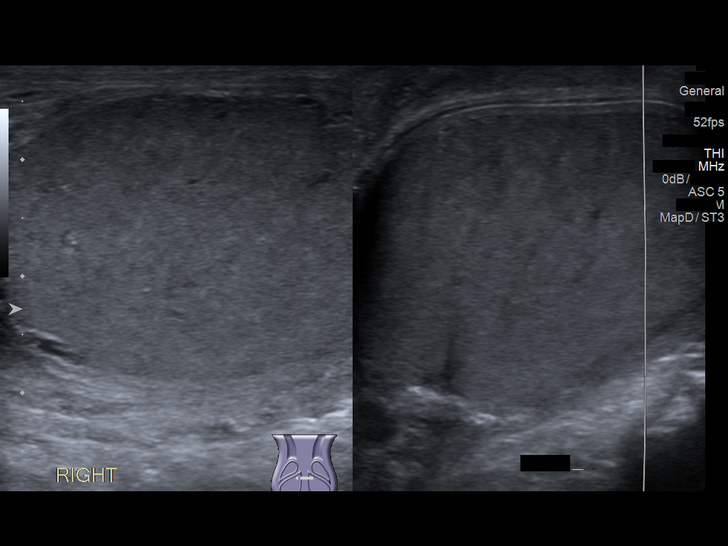
[im 5/59]
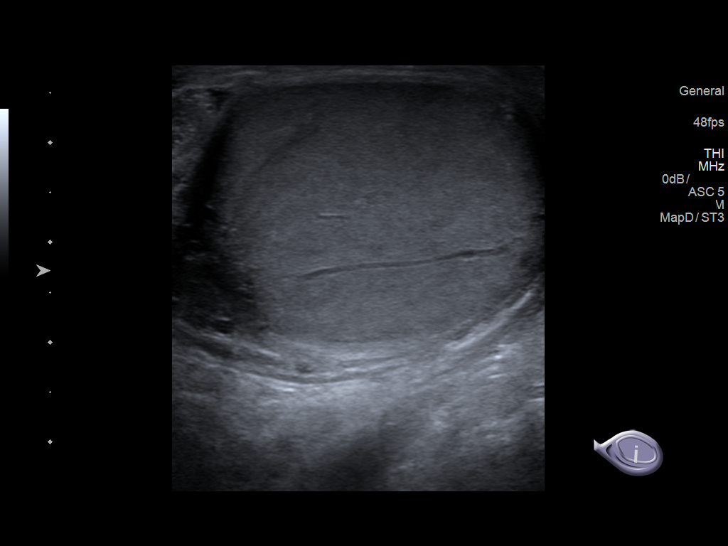
[im 10/59]
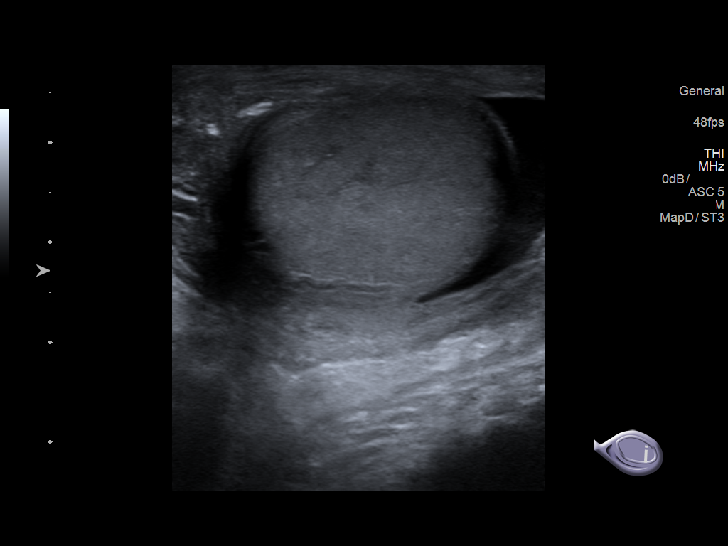
[im 15/59]
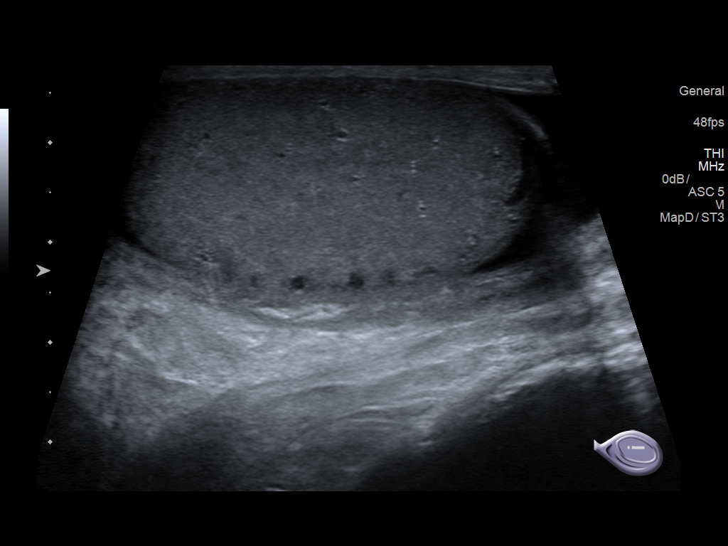
[im 20/59]
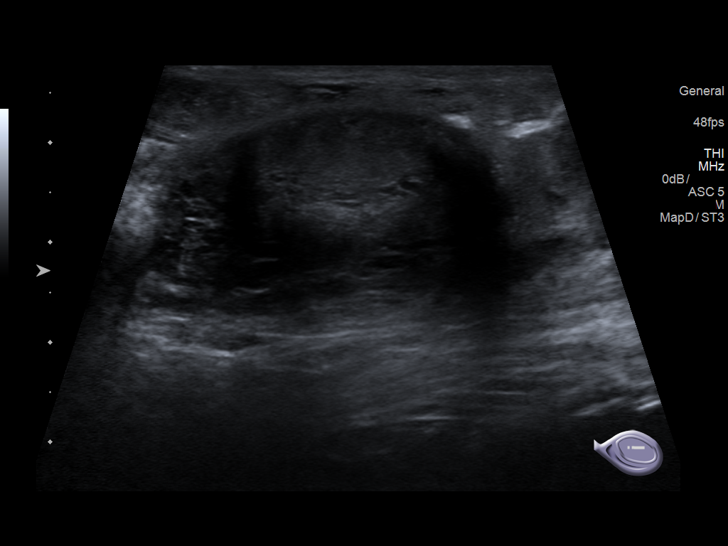
[im 22/59]
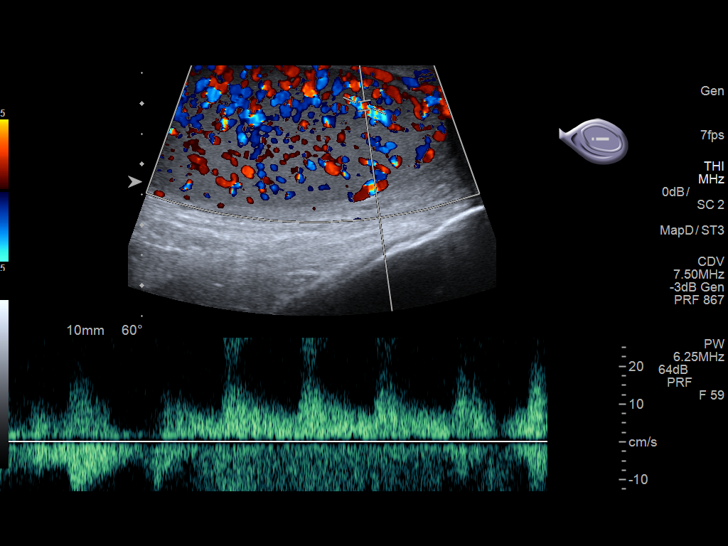
[im 27/59]
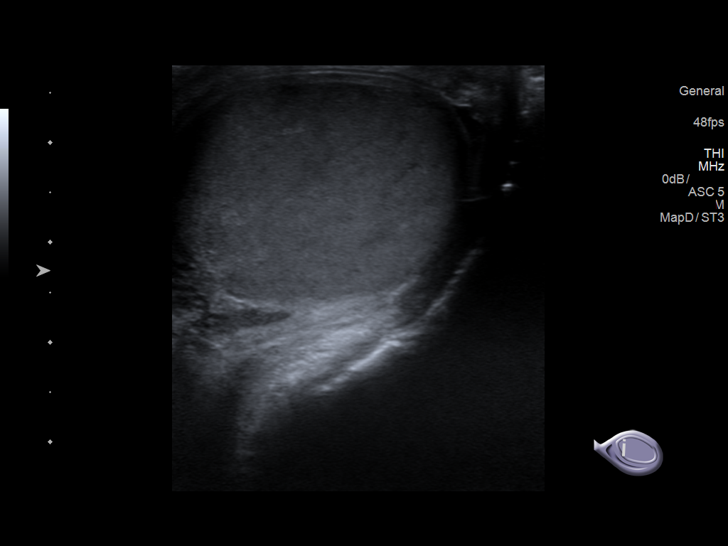
[im 32/59]
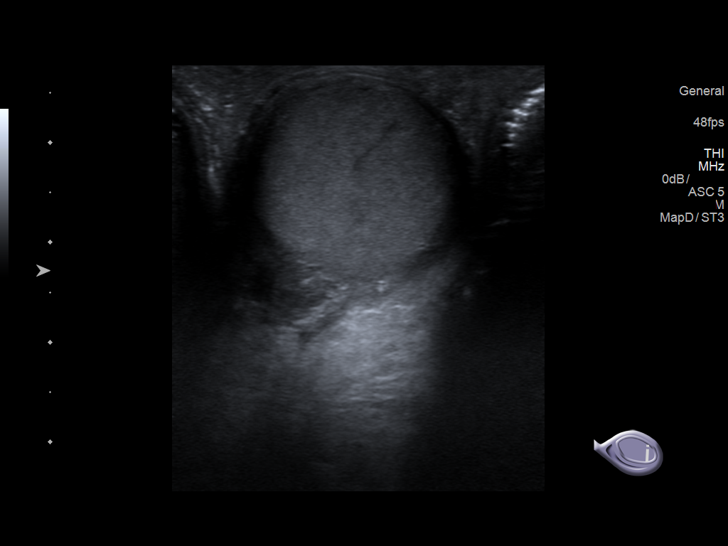
[im 37/59]
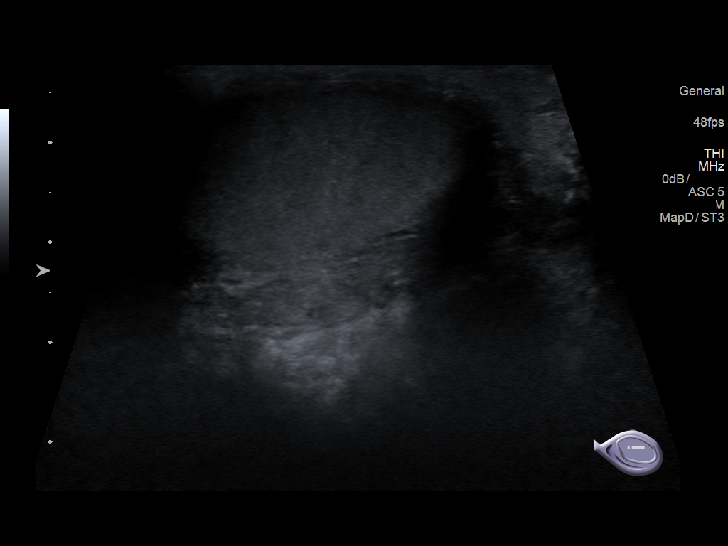
[im 39/59]
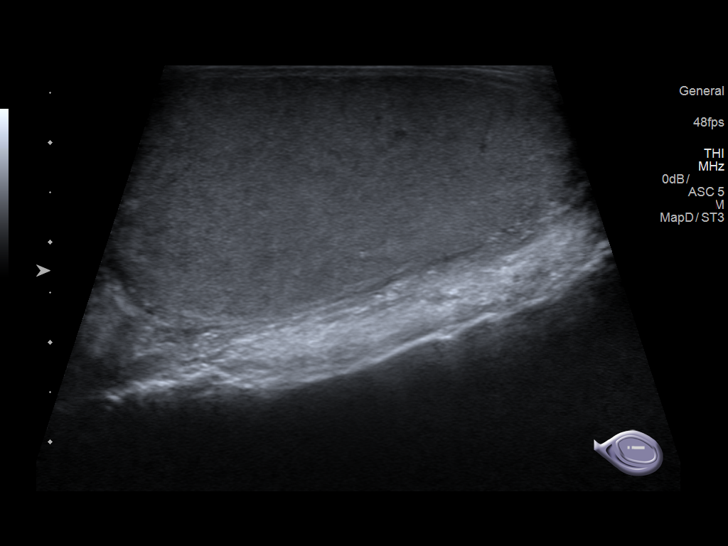
[im 44/59]
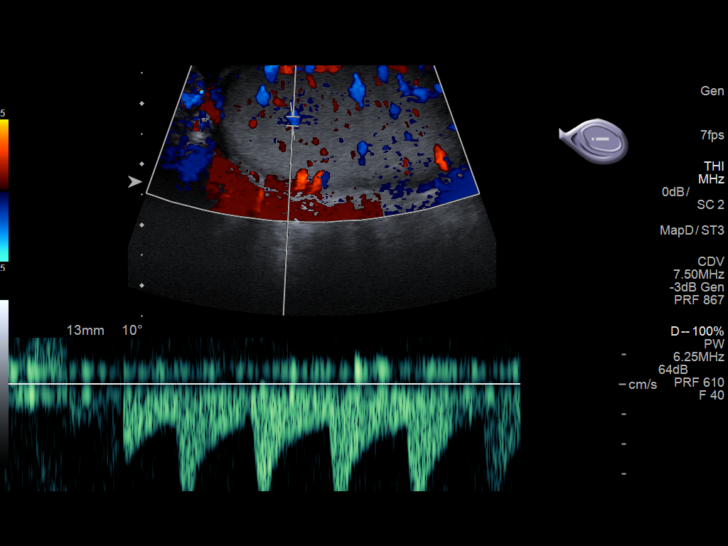
[im 49/59]
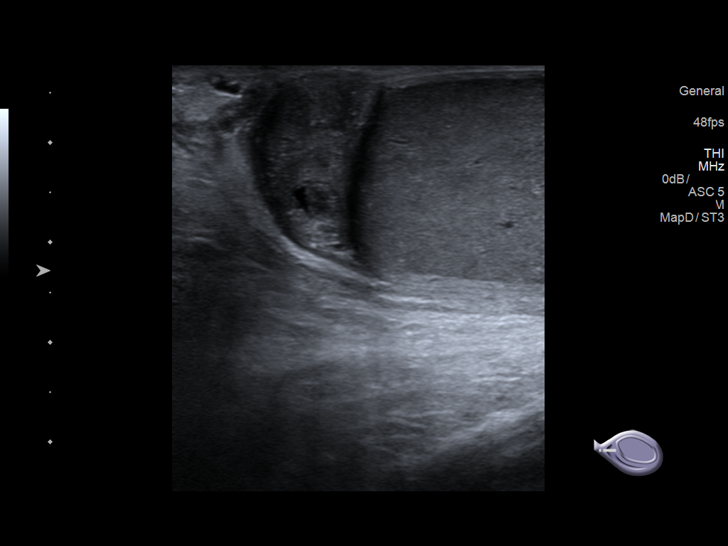
[im 54/59]
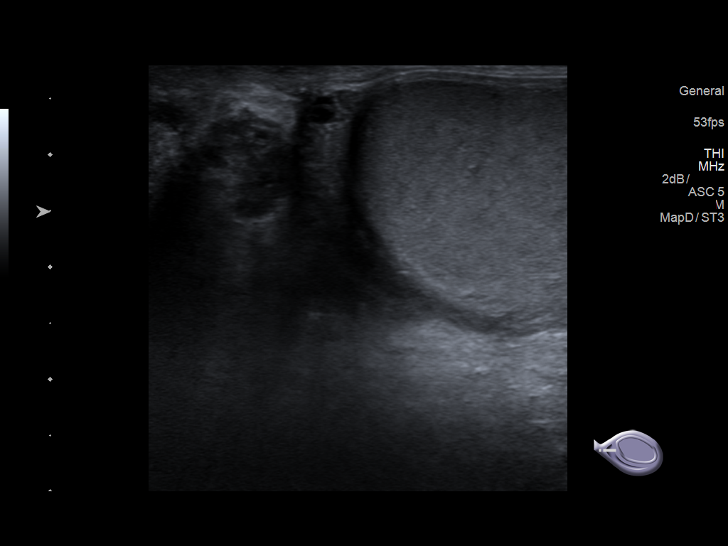
[im 59/59]
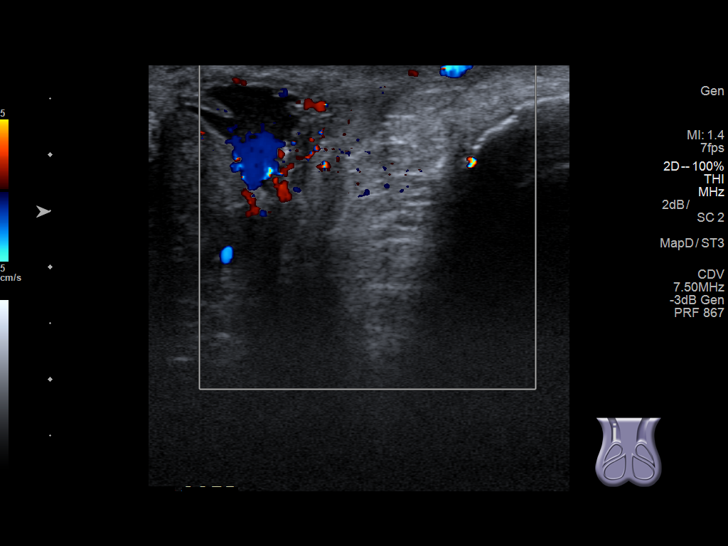

[14 of 25 positions shown; findings below may reference images not displayed]

FINDINGS: Right testicle

Measurements: 5.3 x 3.6 x 2.8 cm. No mass or microlithiasis
visualized. Hypervascularity of the right testicle is noted.

Left testicle

Measurements: 4.9 x 2.9 x 2.5 cm. No mass or microlithiasis
visualized.

Right epididymis:  Hypervascular suggesting epididymitis.

Left epididymis:  3 mm cyst is noted.

Hydrocele:  Mild bilateral hydroceles are noted.

Varicocele:  None visualized.

Pulsed Doppler interrogation of both testes demonstrates normal low
resistance arterial and venous waveforms bilaterally.
IMPRESSION: Hypervascular right testicle and epididymis is noted consistent with
right-sided epididymo-orchitis. Mild bilateral hydroceles are noted.
No evidence of testicular mass or torsion.

## 2019-04-24 ENCOUNTER — Ambulatory Visit: Payer: Medicare HMO | Admitting: Cardiology

## 2019-04-24 ENCOUNTER — Encounter: Payer: Self-pay | Admitting: Cardiology

## 2019-04-24 ENCOUNTER — Other Ambulatory Visit: Payer: Self-pay

## 2019-04-24 VITALS — BP 105/65 | HR 67 | Temp 98.1°F | Ht 66.0 in | Wt 200.0 lb

## 2019-04-24 DIAGNOSIS — I10 Essential (primary) hypertension: Secondary | ICD-10-CM | POA: Diagnosis not present

## 2019-04-24 NOTE — Progress Notes (Signed)
Clinical Summary Trevor Berg is a 68 y.o.male seen today for follow up of the following medical problems  1.CAD - long history of chest pain - cath in 2005 without mild to moderate non-obstructive disease - negative stress echo 09/2012, baseline LVEF 55-60% - 10/2016Lexiscan was ordered due to chest pain. It showed an inferior defect due to subdiaphragmatic attenuation, no ischemia. -  03/2017 EGD with multiple erosions of stomach    - chronic pain unchanged  sharp pain, midchest/epigastric. 8/10 in severity. Can occur at rest or with activity. +SOB. Can feel sweaty - constant pain epigastric. Sharp pain lasts 15-20 minutes. Occurs 5-6 times a week.    2. COPD - compliant with inhalers - followed by pcp  3. HTN - he is compliant with meds   4. Hyperlipidemia labs followed by pcp - compliant with statin   5. AAA screen - history of smoking,male age 89 - 08/2017 no aneurysm  SH: works as a Physiological scientist   Past Medical History:  Diagnosis Date   Anginal pain ([place])    Anxiety    Asthma    Chest pain 2005   noncritical coronary disease in 2005: 40% distal RCA, 30% CX, 50% OM 2, EF-60%,   COPD (chronic obstructive pulmonary disease) (HCC)    Chronic bronchitis; 100-pack-year cigarette consumption   Gastroesophageal reflux disease    possible small Schatzki's ring; esophageal dilatation in 2005   H. pylori infection 2014   treated with prevpac   Hyperlipidemia    Hypertension    Overweight(278.02)    Tobacco abuse    100 pack years     Allergies  Allergen Reactions   Chantix [Varenicline]     Nightmares and personality changes.   Tomato Rash     Current Outpatient Medications  Medication Sig Dispense Refill   albuterol (PROVENTIL HFA;VENTOLIN HFA) 108 (90 BASE) MCG/ACT inhaler Inhale 2 puffs into the lungs every 6 (six) hours as needed for shortness of breath.      amLODipine (NORVASC) 2.5 MG tablet Take 2.5 mg by mouth daily.      ANORO ELLIPTA 62.5-25 MCG/INH AEPB Inhale 1 puff into the lungs daily.      aspirin EC 81 MG tablet Take 81 mg by mouth daily.     atenolol (TENORMIN) 50 MG tablet TAKE ONE TABLET BY MOUTH ONCE DAILY IN THE MORNING 30 tablet 3   atorvastatin (LIPITOR) 20 MG tablet Take 20 mg by mouth at bedtime.     clindamycin (CLEOCIN) 300 MG capsule Take 1 capsule (300 mg total) by mouth 3 (three) times daily. 21 capsule 0   DEXILANT 60 MG capsule Take 60 mg by mouth daily.      dextromethorphan-guaiFENesin (MUCINEX DM) 30-600 MG 12hr tablet Take 1 tablet by mouth every 12 (twelve) hours.      hydrochlorothiazide (MICROZIDE) 12.5 MG capsule TAKE 1 CAPSULE BY MOUTH ONCE A DAY FOR HTN/SWELLING  3   ibuprofen (ADVIL,MOTRIN) 800 MG tablet Take 800 mg by mouth 3 (three) times daily as needed for moderate pain.      levofloxacin (LEVAQUIN) 500 MG tablet Take 1 tablet (500 mg total) by mouth daily. 10 tablet 0   [name] 290 MCG CAPS capsule TAKE 1 CAPSULE (290 MCG TOTAL) BY MOUTH DAILY. 90 capsule 3   lisinopril (PRINIVIL,ZESTRIL) 40 MG tablet Take 1 tablet (40 mg total) by mouth daily. 90 tablet 3   LORazepam (ATIVAN) 1 MG tablet Take 1 mg by mouth 3 (three) times  daily as needed for anxiety.      Naphazoline HCl (CLEAR EYES OP) Apply 1 drop to eye daily as needed (dry eyes).     nitroGLYCERIN (NITROSTAT) 0.4 MG SL tablet Place 0.4 mg under the tongue every 5 (five) minutes as needed for chest pain. Chest Pain     oxyCODONE-acetaminophen (PERCOCET/ROXICET) 5-325 MG tablet Take 1 tablet by mouth every 4 (four) hours as needed for severe pain. 30 tablet 0   oxyCODONE-acetaminophen (PERCOCET/ROXICET) 5-325 MG tablet Take 1-2 tablets by mouth every 4 (four) hours as needed. 12 tablet 0   pantoprazole (PROTONIX) 40 MG tablet Take 40 mg by mouth daily.     polyethylene glycol powder (GLYCOLAX/MIRALAX) powder USE 1 CAPFUL DAILY AS NEEDED FOR CONSTIPATION (Patient taking differently: USE 1 CAPFUL  DAILY) 527 g 11   sertraline (ZOLOFT) 50 MG tablet Take 50 mg by mouth daily.     traZODone (DESYREL) 50 MG tablet Take 50 mg by mouth at bedtime.  0   vitamin B-12 (CYANOCOBALAMIN) 1000 MCG tablet Take 2,000 mcg by mouth daily.      No current facility-administered medications for this visit.      Past Surgical History:  Procedure Laterality Date   CERVICAL SPINE SURGERY     C5-6 and C6-7: Relief of spinal stenosis with corpectomy, foraminotomy and fusion   COLONOSCOPY  10/19/2004   RMR:   Friable anal canal, likely source of patient's hematochezia/  Otherwise normal rectum, normal colon.   COLONOSCOPY N/A 09/09/2015   Procedure: COLONOSCOPY;  Surgeon: Daneil Dolin, MD;  [facility];  Service: Endoscopy;  Laterality: N/A;  1015   ESOPHAGEAL DILATION N/A 09/09/2015   Procedure: ESOPHAGEAL DILATION;  Surgeon: Daneil Dolin, MD;  Location: [facility];  Service: Endoscopy;  Laterality: N/A;   ESOPHAGOGASTRODUODENOSCOPY  09/27/2004   RMR:   A couple of tiny, distal esophageal erosions, otherwise normal esophagus aside from a Schatzki ring status post Maloney dilation as described above/ Small hiatal hernia, otherwise normal stomach, normal D1-D2   ESOPHAGOGASTRODUODENOSCOPY N/A 09/09/2015   Procedure: ESOPHAGOGASTRODUODENOSCOPY (EGD);  Surgeon: Daneil Dolin, MD;  [facility];  Service: Endoscopy;  Laterality: N/A;   ESOPHAGOGASTRODUODENOSCOPY N/A 04/19/2017   Procedure: ESOPHAGOGASTRODUODENOSCOPY (EGD);  Surgeon: Daneil Dolin, MD;  [facility];  Service: Endoscopy;  Laterality: N/A;  730    ESOPHAGOGASTRODUODENOSCOPY (EGD) WITH ESOPHAGEAL DILATION N/A 03/19/2013   CHE:NIDPOE esophagus s/p dilation/Abnormal stomach of uncertain clinical significance-s/p bx positive for H. pylori, status post Prevpac treatment   KNEE ARTHROSCOPY WITH MEDIAL MENISECTOMY Right 12/06/2017   Procedure: RIGHT KNEE ARTHROSCOPY WITH PARTIAL MEDIAL MENISECTOMY;  REMOVAL OF LOOSE BODY;  Surgeon: Carole Civil, MD;  Location: [facility];  Service: Orthopedics;  Laterality: Right;   MALONEY DILATION N/A 04/19/2017   Procedure: Venia Minks DILATION;  Surgeon: Daneil Dolin, MD;  [facility];  Service: Endoscopy;  Laterality: N/A;   ORIF ANKLE FRACTURE     right   ORIF TIBIA FRACTURE     Left   TONSILLECTOMY       Allergies  Allergen Reactions   Chantix [Varenicline]     Nightmares and personality changes.   Tomato Rash      Family History  Problem Relation Age of Onset   Heart attack Mother        deceased, age 68s   Ulcers Brother    Diabetes Brother    Hypertension Unknown    Colon cancer  Neg Hx      Social History Trevor Berg reports that he has been smoking cigarettes. He has a 75.00 pack-year smoking history. He has never used smokeless tobacco. Trevor Berg reports no history of alcohol use.   Review of Systems CONSTITUTIONAL: No weight loss, fever, chills, weakness or fatigue.  HEENT: Eyes: No visual loss, blurred vision, double vision or yellow sclerae.No hearing loss, sneezing, congestion, runny nose or sore throat.  SKIN: No rash or itching.  CARDIOVASCULAR: per hpi RESPIRATORY: No shortness of breath, cough or sputum.  GASTROINTESTINAL: No anorexia, nausea, vomiting or diarrhea. No abdominal pain or blood.  GENITOURINARY: No burning on urination, no polyuria NEUROLOGICAL: No headache, dizziness, syncope, paralysis, ataxia, numbness or tingling in the extremities. No change in bowel or bladder control.  MUSCULOSKELETAL: No muscle, back pain, joint pain or stiffness.  LYMPHATICS: No enlarged nodes. No history of splenectomy.  PSYCHIATRIC: No history of depression or anxiety.  ENDOCRINOLOGIC: No reports of sweating, cold or heat intolerance. No polyuria or polydipsia.  Marland Kitchen   Physical Examination Today's Vitals   04/24/19 1047  BP: 105/65  Pulse: 67  Temp: 98.1 F (36.7 C)  SpO2: 95%  Weight: 200  lb (90.7 kg)  Height: 5\' 6"  (1.676 m)   Body mass index is 32.28 kg/m.  Gen: resting comfortably, no acute distress HEENT: no scleral icterus, pupils equal round and reactive, no palptable cervical adenopathy,  CV: RRR, no m/r/g, no jvd Resp: Clear to auscultation bilaterally GI: abdomen is soft, non-tender, non-distended, normal bowel sounds, no hepatosplenomegaly MSK: extremities are warm, no edema.  Skin: warm, no rash Neuro:  no focal deficits Psych: appropriate affect   Diagnostic Studies 07/2004 Cath The left main is a large vessel with no significant disease.  The LAD is a medium size vessel which courses three quarters of the way down the anterior wall and has no significant disease. There are two small diagonal branches with no significant disease.  The left circumflex is a large vessel coursing through the AV groove. The third obtuse marginal Barbi Kumagai is free of the AV groove circumflex and noted to have a mild 30% narrowing after the takeoff the second OM.  The first OM is a small vessel which bifurcates in the proximal portion with no significant disease.  The second OM is a large vessel which bifurcates distally and has 50% mid vessel stenosis.  The third OM is a small vessel with no significant disease.  The right coronary artery is a large vessel which is dominant and gives rise to both the PDA as well as the posterolateral Moishy Laday. The RCA is ectatic but has no high grade stenosis.  The PDA is a medium size vessel with no significant disease.  The PLA is a large vessel which bifurcates in the mid segment. There is 40% lesion in the proximal portion fo the lower bifurcation.  Left ventriculogram reveals a preserved ejection fraction of 60%.  Hemodynamics: The systemic arterial pressure was 112/69. Left ventricular pressure was 115/8. Left ventricular end diastolic pressure was 13.  CONCLUSION: 1. Non-critical  coronary artery disease. 2. Normal left ventricular systolic function.   09/2012 Stress echo Study Conclusions  - Stress: There was resting hypertension with a hypertensive response to stress. Atypical chest pain was present before and during exercise without increase in intensity, and it persisted following conclusion of the test. - Stress ECG conclusions: No diagnostic ST abnormalities or arrhythmias. The stress ECG was negative for ischemia. [facility] scoring: exercise time  of 55min; maximum ST deviation of 71mm; angina present but did not limit exercise; resulting score is -1. This score predicts a moderate risk of cardiac events. - Staged echo: There was no echocardiographic evidence for stress-induced ischemia. - Peak stress: LV global systolic function was vigorous. The estimated LV ejection fraction was 70% to 75%. No evidence for new LV regional wall motion abnormalities.   07/2015 Nuclear stress test  There was no ST segment deviation noted during stress.  Defect 1: There is a medium defect of mild severity present in the basal inferior, mid inferior and apical inferior location, which is likely due to soft tissue attenuation artifact. A mild degree of myocardial scar cannot entirely be ruled out.  This is a low risk study.  Nuclear stress EF: 62%.    Assessment and Plan  1.Chest pain - long history of atypical chest pain, symptoms overall unchanged over several years - negative ischemic evaluations in the past including cath in 2005, and most recently a lexiscan negative for ischemia 07/2015. Perhaps related to his prior EGD findings  EKG shows mild sinus brady at 56, no acute ischemic changes - no additional cardiac workup at this time, continue to monitor.   2. HTN - he is at goal, continue current meds  3. HL - continue statin, request labs from pcp   F/u 1 year  Arnoldo Lenis, M.D.

## 2019-04-24 NOTE — Patient Instructions (Signed)

## 2019-06-25 ENCOUNTER — Other Ambulatory Visit: Payer: Self-pay | Admitting: *Deleted

## 2019-06-25 DIAGNOSIS — K5909 Other constipation: Secondary | ICD-10-CM

## 2019-06-25 MED ORDER — LINZESS 290 MCG PO CAPS: 290.0000 ug | ORAL_CAPSULE | Freq: Every day | ORAL | 0 refills | Status: DC

## 2019-06-25 NOTE — Telephone Encounter (Signed)
Received refill request for linzess 240mcg 1 cap QD #90 day supply

## 2019-06-25 NOTE — Telephone Encounter (Signed)
Please tell the patient I provided him with a limited supply. We have not seen him in about 2 years.  You will need a follow-up office visit for further refills.

## 2019-06-25 NOTE — Addendum Note (Signed)
Addended by: Gordy Levan, ERIC A on: 06/25/2019 01:55 PM   Modules accepted: Orders

## 2019-06-25 NOTE — Telephone Encounter (Signed)
Pt's spouse notified that pt will need a f/u for additional refills. Spouse is aware that limited supply was sent to the pharmacy for pt. Spouse it going to call back for an appointment.

## 2019-07-22 ENCOUNTER — Encounter: Payer: Self-pay | Admitting: Gastroenterology

## 2019-07-22 NOTE — Progress Notes (Signed)
Referring Provider: Jani Gravel, MD Primary Care Physician:  Jani Gravel, MD Primary GI Physician: Dr. Gala Romney  Chief Complaint  Patient presents with  . Abdominal Pain    UPPER ABD, constant throbbing sensation  . Constipation    BM's varies, most without has been 2-3 days    HPI:   Trevor Berg is a 68 y.o. male presenting today for abdominal pain and constipation. GI history of GERD, chronic upper abdominal pain, dysphagia s/p multiple dilations, and constipation. Patient last seen in our office on 08/21/17 for GERD and constipation. At that time GERD was well controlled on Protonix 40 mg daily. Dysphagia was improved s/p recent dilation. Occasional trouble with large pieces of meat. Constipation was better managed on [name] 290 mcg. He also continued taking MriaLAX and fiber supplement. Abdominal pain associated with constipation. Advised to continue present medications other than MriaLAX and follow-up in 3 months.   Today he states the [name] 290 every day and 1 capful MiraLAX daily has not been working well for the last 6 months. Will go 2-4 days without a BM. Stools are loose at times other times hard. Will have to strain at times. Lower abdominal pain extending up to upper abdomen. Worse since constipation has been worsening. About a 5-6/10. Abdominal pain will improve with a good BM. No brbpr. No melena. No unintentional weight loss.   Admits to "real bad" reflux and heart burn. Taking Dexilant and Protonix daily. Has been on the regimen for a while. Will still have reflux daily. Occasional fried food. No spicy foods. No carbonated beverage. No tomato based products. Feels foods gets hung in his esophagus several times a week. Dilation in 2018 helped. Started again over the last couple of months. Occurs with solid foods typically and liquids at times. Has had to bring food back up. Continues to have chronic upper abdominal pain. States it feels like something is in there twisting.  Occasionally worsens after he eats. No nausea or vomiting.   Denies fever, chills, lightheadedness, dizziness, unintentional weight loss.  Denies chest pain, heart palpitations. No SOB at rest. Some SOB with exertion. At baseline with COPD. Occasional cough.   Past Medical History:  Diagnosis Date  . Anginal pain ([place])   . Anxiety   . Asthma   . Chest pain 2005   noncritical coronary disease in 2005: 40% distal RCA, 30% CX, 50% OM 2, EF-60%,  . COPD (chronic obstructive pulmonary disease) ([facility])    Chronic bronchitis; 100-pack-year cigarette consumption  . Gastroesophageal reflux disease    possible small Schatzki's ring; esophageal dilatation in 2005  . H. pylori infection 2014   treated with prevpac  . Hyperlipidemia   . Hypertension   . Overweight(278.02)   . Tobacco abuse    100 pack years    Past Surgical History:  Procedure Laterality Date  . CERVICAL SPINE SURGERY     C5-6 and C6-7: Relief of spinal stenosis with corpectomy, foraminotomy and fusion  . COLONOSCOPY  10/19/2004   RMR:   Friable anal canal, likely source of patient's hematochezia/  Otherwise normal rectum, normal colon.  . COLONOSCOPY N/A 09/09/2015   RMR: Pancolonic diverticulosis. Multiple rectal polyps and 1 polyp in the ascending colon. Pathology with hyperplastic rectal polyps and tubular adenoma colon polyp. Repeat in 2021.  . ESOPHAGEAL DILATION N/A 09/09/2015   Procedure: ESOPHAGEAL DILATION;  Surgeon: Daneil Dolin, MD;  [facility];  Service: Endoscopy;  Laterality: N/A;  . ESOPHAGOGASTRODUODENOSCOPY  09/27/2004   RMR:   A couple of tiny, distal esophageal erosions, otherwise normal esophagus aside from a Schatzki ring status post Maloney dilation as described above/ Small hiatal hernia, otherwise normal stomach, normal D1-D2  . ESOPHAGOGASTRODUODENOSCOPY N/A 09/09/2015   Procedure: ESOPHAGOGASTRODUODENOSCOPY (EGD);  Surgeon: Daneil Dolin, MD;  Location: [facility];  Service:  Endoscopy;  Laterality: N/A;  . ESOPHAGOGASTRODUODENOSCOPY N/A 04/19/2017   RMR: Normal esophagus. Dilated. Erosive gastropathy. Biopsied. Otherwise normal. Pathology with mild chronic gastritis. No H. pylori  . ESOPHAGOGASTRODUODENOSCOPY (EGD) WITH ESOPHAGEAL DILATION N/A 03/19/2013   [id number] esophagus s/p dilation/Abnormal stomach of uncertain clinical significance-s/p bx positive for H. pylori, status post Prevpac treatment  . KNEE ARTHROSCOPY WITH MEDIAL MENISECTOMY Right 12/06/2017   Procedure: RIGHT KNEE ARTHROSCOPY WITH PARTIAL MEDIAL MENISECTOMY; REMOVAL OF LOOSE BODY;  Surgeon: Carole Civil, MD;  Location: [facility];  Service: Orthopedics;  Laterality: Right;  . MALONEY DILATION N/A 04/19/2017   Procedure: Venia Minks DILATION;  Surgeon: Daneil Dolin, MD;  Location: [facility];  Service: Endoscopy;  Laterality: N/A;  . ORIF ANKLE FRACTURE     right  . ORIF TIBIA FRACTURE     Left  . TONSILLECTOMY      Current Outpatient Medications  Medication Sig Dispense Refill  . albuterol (PROVENTIL HFA;VENTOLIN HFA) 108 (90 BASE) MCG/ACT inhaler Inhale 2 puffs into the lungs every 6 (six) hours as needed for shortness of breath.     Marland Kitchen amLODipine (NORVASC) 2.5 MG tablet Take 2.5 mg by mouth daily.    Jearl Klinefelter ELLIPTA 62.5-25 MCG/INH AEPB Inhale 1 puff into the lungs daily.     Marland Kitchen aspirin EC 81 MG tablet Take 81 mg by mouth daily.    Marland Kitchen atenolol (TENORMIN) 50 MG tablet TAKE ONE TABLET BY MOUTH ONCE DAILY IN THE MORNING 30 tablet 3  . dextromethorphan-guaiFENesin (MUCINEX DM) 30-600 MG 12hr tablet Take 1 tablet by mouth 2 (two) times daily.    . fluticasone (FLONASE) 50 MCG/ACT nasal spray Place 2 sprays into both nostrils daily.    . hydrochlorothiazide (MICROZIDE) 12.5 MG capsule TAKE 1 CAPSULE BY MOUTH ONCE A DAY FOR HTN/SWELLING  3  . lisinopril (PRINIVIL,ZESTRIL) 40 MG tablet Take 1 tablet (40 mg total) by mouth daily. 90 tablet 3  . LORazepam (ATIVAN) 1 MG tablet Take 1 mg by mouth 3  (three) times daily as needed for anxiety.     . Naphazoline HCl (CLEAR EYES OP) Apply 1 drop to eye daily as needed (dry eyes).    . nitroGLYCERIN (NITROSTAT) 0.4 MG SL tablet Place 0.4 mg under the tongue every 5 (five) minutes as needed for chest pain. Chest Pain    . polyethylene glycol powder (GLYCOLAX/MIRALAX) powder USE 1 CAPFUL DAILY AS NEEDED FOR CONSTIPATION (Patient taking differently: USE 1 CAPFUL DAILY) 527 g 11  . sertraline (ZOLOFT) 50 MG tablet Take 50 mg by mouth daily.    . simvastatin (ZOCOR) 20 MG tablet Take 20 mg by mouth daily.    . traZODone (DESYREL) 50 MG tablet Take 50 mg by mouth at bedtime.  0  . vitamin B-12 (CYANOCOBALAMIN) 1000 MCG tablet Take 2,000 mcg by mouth daily.     Marland Kitchen esomeprazole (NEXIUM) 40 MG capsule Take 1 capsule (40 mg total) by mouth 2 (two) times daily before a meal. 60 capsule 5  . lubiprostone ([name]) 24 MCG capsule Take 1 capsule (24 mcg total) by mouth 2 (two) times daily with a meal. 60 capsule 5  No current facility-administered medications for this visit.     Allergies as of 07/23/2019 - Review Complete 07/23/2019  Allergen Reaction Noted  . Chantix [varenicline]  02/23/2014  . Tomato Rash 06/16/2011    Family History  Problem Relation Age of Onset  . Heart attack Mother        deceased, age 56s  . Ulcers Brother   . Diabetes Brother   . Hypertension Other   . Colon cancer Neg Hx     Social History   Socioeconomic History  . Marital status: Married    Spouse name: Not on file  . Number of children: 3  . Years of education: Not on file  . Highest education level: Not on file  Occupational History  . Occupation: disability  Social Needs  . Financial resource strain: Not on file  . Food insecurity    Worry: Not on file    Inability: Not on file  . Transportation needs    Medical: Not on file    Non-medical: Not on file  Tobacco Use  . Smoking status: Current Every Day Smoker    Packs/day: 1.50    Years: 50.00     Pack years: 75.00    Types: Cigarettes  . Smokeless tobacco: Never Used  . Tobacco comment: 1 pack per day as of 08/2012  Substance and Sexual Activity  . Alcohol use: No    Alcohol/week: 0.0 standard drinks    Comment: occasionally, usually holidays but sometimes on weekends, prior heavy use  . Drug use: No  . Sexual activity: Yes    Birth control/protection: None  Lifestyle  . Physical activity    Days per week: Not on file    Minutes per session: Not on file  . Stress: Not on file  Relationships  . Social Herbalist on phone: Not on file    Gets together: Not on file    Attends religious service: Not on file    Active member of club or organization: Not on file    Attends meetings of clubs or organizations: Not on file    Relationship status: Not on file  Other Topics Concern  . Not on file  Social History Narrative  . Not on file    Review of Systems: Gen: HPI CV: The HPI Resp: See HPI GI: See HPI Derm: Denies rash Psych: Denies depression, anxiety Heme: Denies bruising, bleeding  Physical Exam: BP (!) 146/79   Pulse 69   Temp (!) 97 F (36.1 C) (Oral)   Ht 5\' 6"  (1.676 m)   Wt 203 lb 9.6 oz (92.4 kg)   BMI 32.86 kg/m  General:   Alert and oriented. No distress noted. Pleasant and cooperative.  Head:  Normocephalic and atraumatic. Eyes:  Conjuctiva clear without scleral icterus. Heart:  S1, S2 present without murmurs appreciated. Lungs:  Clear to auscultation bilaterally. No wheezes, rales, or rhonchi. No distress.  Abdomen:  +BS, soft, and non-distended. Diffuse mild tenderness to palpation, somewhat worse in the LLQ and epigastric area. No rebound or guarding. No HSM or masses noted. Msk:  Symmetrical without gross deformities. Normal posture. Extremities:  Without edema. Neurologic:  Alert and  oriented x4 Psych:  Normal mood and affect.

## 2019-07-23 ENCOUNTER — Encounter: Payer: Self-pay | Admitting: *Deleted

## 2019-07-23 ENCOUNTER — Other Ambulatory Visit: Payer: Self-pay

## 2019-07-23 ENCOUNTER — Other Ambulatory Visit: Payer: Self-pay | Admitting: *Deleted

## 2019-07-23 ENCOUNTER — Encounter: Payer: Self-pay | Admitting: Gastroenterology

## 2019-07-23 ENCOUNTER — Ambulatory Visit: Payer: Medicare HMO | Admitting: Gastroenterology

## 2019-07-23 VITALS — BP 146/79 | HR 69 | Temp 97.0°F | Ht 66.0 in | Wt 203.6 lb

## 2019-07-23 DIAGNOSIS — K5909 Other constipation: Secondary | ICD-10-CM

## 2019-07-23 DIAGNOSIS — R1310 Dysphagia, unspecified: Secondary | ICD-10-CM

## 2019-07-23 DIAGNOSIS — K219 Gastro-esophageal reflux disease without esophagitis: Secondary | ICD-10-CM | POA: Diagnosis not present

## 2019-07-23 MED ORDER — LUBIPROSTONE 24 MCG PO CAPS: 24.0000 ug | ORAL_CAPSULE | Freq: Two times a day (BID) | ORAL | 5 refills | Status: DC

## 2019-07-23 MED ORDER — ESOMEPRAZOLE MAGNESIUM 40 MG PO CPDR: 40.0000 mg | DELAYED_RELEASE_CAPSULE | Freq: Two times a day (BID) | ORAL | 5 refills | Status: DC

## 2019-07-23 NOTE — Patient Instructions (Addendum)
We will get you scheduled for an upper endoscopy with possible dilation of your esophagus in the near future with Dr. Gala Romney.  Stop taking Dexilant and Protonix and start taking Nexium 40 mg twice daily 30 minutes before breakfast and dinner.   Stop taking [name] 290 and start taking [name] 24 mcg twice daily with a meal. Call and let me know how this works for you.   You may continue taking MiraLAX 1 capful daily as needed.   Please add benefiber or metamucil daily. Start with a low dose and increase slowly.   Please call us if your abdominal pain worsens.   We will plan to see you back in the office after your procedure. If you have questions or concerns prior, do not hesitate to call.   Trevor Altes, PA-C [facility]   Constipation, Adult Constipation is when a person:  Poops (has a bowel movement) fewer times in a week than normal.  Has a hard time pooping.  Has poop that is dry, hard, or bigger than normal. Follow these instructions at home: Eating and drinking   Eat foods that have a lot of fiber, such as: ? Fresh fruits and vegetables. ? Whole grains. ? Beans.  Eat less of foods that are high in fat, low in fiber, or overly processed, such as: ? [place] fries. ? Hamburgers. ? Cookies. ? Candy. ? Soda.  Drink enough fluid to keep your pee (urine) clear or pale yellow. General instructions  Exercise regularly or as told by your doctor.  Go to the restroom when you feel like you need to poop. Do not hold it in.  Take over-the-counter and prescription medicines only as told by your doctor. These include any fiber supplements.  Do pelvic floor retraining exercises, such as: ? Doing deep breathing while relaxing your lower belly (abdomen). ? Relaxing your pelvic floor while pooping.  Watch your condition for any changes.  Keep all follow-up visits as told by your doctor. This is important. Contact a doctor if:  You have pain that gets  worse.  You have a fever.  You have not pooped for 4 days.  You throw up (vomit).  You are not hungry.  You lose weight.  You are bleeding from the anus.  You have thin, pencil-like poop (stool). Get help right away if:  You have a fever, and your symptoms suddenly get worse.  You leak poop or have blood in your poop.  Your belly feels hard or bigger than normal (is bloated).  You have very bad belly pain.  You feel dizzy or you faint. This information is not intended to replace advice given to you by your health care provider. Make sure you discuss any questions you have with your health care provider. Document Released: 04/03/2008 Document Revised: 09/28/2017 Document Reviewed: 04/05/2016 Elsevier Patient Education  2020 [employer].

## 2019-07-23 NOTE — Assessment & Plan Note (Addendum)
Symptoms not ideally controlled. Taking [name] 290 mcg and MiraLAX daily. This combination worked well in the past but not for the last 6 months. Will go 2-4 days without a BM. Has associated lower abdominal pain extending up to upper with constipation. Improves after a BM. Stools are hard at times and other times are loose. No brbpr or melena. No unintentional weight loss.  He does have mild abdominal tenderness to palpation diffusely somewhat worse in the left lower quadrant.   Stop [name] 290 mcg. Start [name] 24 mcg twice daily with breakfast and dinner. Add Benefiber or Metamucil daily. May continue using MiraLAX 1 capful (17 g) daily as needed.  Constipation handout provided. Advised patient to call if his abdominal pain worsens or does not continue to improve with better management of his constipation.

## 2019-07-24 ENCOUNTER — Encounter: Payer: Self-pay | Admitting: *Deleted

## 2019-07-24 ENCOUNTER — Telehealth: Payer: Self-pay

## 2019-07-24 NOTE — Telephone Encounter (Signed)
Spoke with pt's spouse. Pt's Nexium RX which was given 07/23/2019 isn't covered for twice daily. A PA was submitted through [email or web address]. pt is aware that we will contact him once we receive an approval or denial.

## 2019-07-26 ENCOUNTER — Encounter: Payer: Self-pay | Admitting: Gastroenterology

## 2019-07-26 NOTE — Assessment & Plan Note (Addendum)
Chronic. Not adequately controlled.  Currently taking Dexilant 60 mg in the morning and Protonix 40 mg in the evening.  Associated symptoms include chronic epigastric pain and recurrence of dysphasia.  Dysphagia several times a week typically to solid foods but occasionally to liquids.  Denies nausea or vomiting, melena, bright red blood per rectum, unintentional weight loss.  Abdominal exam with diffuse mild tenderness to palpation somewhat worse in the epigastric area and left lower quadrant.  Last EGD on 04/19/2017 with normal esophagus s/p dilation, erosive gastropathy, otherwise normal.  Pathology with mild chronic gastritis, no H. Pylori.   Differentials include loss of effect of PPI after long term PPI use, H. Pylori, gastritis, esophagitis, or duodenitis. May have developed esophageal stricture or ring secondary to uncontrolled GERD.   Stop Dexilant and Protonix. Start Nexium 40 mg twice daily. EGD with stomach biopsy for H. Pylori evaluation +/- dilation with propofol with Dr. Gala Romney in the near future. The risks, benefits, and alternatives have been discussed in detail with patient. They have stated understanding and desire to proceed.  Follow up after procedure.

## 2019-07-26 NOTE — Assessment & Plan Note (Signed)
Recurrence of dysphagia x1 month.  Last dilation in June 2018 which worked well.  Now with dysphagia several times a week typically to solid foods and occasionally liquids.  This is in the setting of uncontrolled GERD as addressed above.  Proceed with EGD +/- dilation with propofol with Dr. Gala Romney in the near future. The risks, benefits, and alternatives have been discussed in detail with patient. They have stated understanding and desire to proceed.  Follow-up after procedure.

## 2019-09-24 NOTE — Patient Instructions (Signed)
Trevor Berg  09/24/2019     @PREFPERIOPPHARMACY @   Your procedure is scheduled on  10/02/2019  Report to [facility] at  [place].M.  Call this number if you have problems the morning of surgery:  [phone]   Remember:  Follow the diet instructions given to you by Dr Roseanne Kaufman office.                     Take these medicines the morning of surgery with A SIP OF WATER  Amlodipine, atenolol, nexium, ativan(if needed), zoloft. Use your inhaler before you come.    Do not wear jewelry, make-up or nail polish.  Do not wear lotions, powders, or perfumes. Please wear deodorant and brush your teeth.  Do not shave 48 hours prior to surgery.  Men may shave face and neck.  Do not bring valuables to the hospital.  [facility] is not responsible for any belongings or valuables.  Contacts, dentures or bridgework may not be worn into surgery.  Leave your suitcase in the car.  After surgery it may be brought to your room.  For patients admitted to the hospital, discharge time will be determined by your treatment team.  Patients discharged the day of surgery will not be allowed to drive home.   Name and phone number of your driver:   family Special instructions:  None  Please read over the following fact sheets that you were given. Anesthesia Post-op Instructions and Care and Recovery After Surgery       Upper Endoscopy, Adult, Care After This sheet gives you information about how to care for yourself after your procedure. Your health care provider may also give you more specific instructions. If you have problems or questions, contact your health care provider. What can I expect after the procedure? After the procedure, it is common to have:  A sore throat.  Mild stomach pain or discomfort.  Bloating.  Nausea. Follow these instructions at home:   Follow instructions from your health care provider about what to eat or drink after your procedure.  Return to your  normal activities as told by your health care provider. Ask your health care provider what activities are safe for you.  Take over-the-counter and prescription medicines only as told by your health care provider.  Do not drive for 24 hours if you were given a sedative during your procedure.  Keep all follow-up visits as told by your health care provider. This is important. Contact a health care provider if you have:  A sore throat that lasts longer than one day.  Trouble swallowing. Get help right away if:  You vomit blood or your vomit looks like coffee grounds.  You have: ? A fever. ? Bloody, black, or tarry stools. ? A severe sore throat or you cannot swallow. ? Difficulty breathing. ? Severe pain in your chest or abdomen. Summary  After the procedure, it is common to have a sore throat, mild stomach discomfort, bloating, and nausea.  Do not drive for 24 hours if you were given a sedative during the procedure.  Follow instructions from your health care provider about what to eat or drink after your procedure.  Return to your normal activities as told by your health care provider. This information is not intended to replace advice given to you by your health care provider. Make sure you discuss any questions you have with your health care provider.  Document Released: 04/16/2012 Document Revised: 04/09/2018 Document Reviewed: 03/18/2018 Elsevier Patient Education  2020 [employer].  Esophageal Dilatation Esophageal dilatation, also called esophageal dilation, is a procedure to widen or open (dilate) a blocked or narrowed part of the esophagus. The esophagus is the part of the body that moves food and liquid from the mouth to the stomach. You may need this procedure if:  You have a buildup of scar tissue in your esophagus that makes it difficult, painful, or impossible to swallow. This can be caused by gastroesophageal reflux disease (GERD).  You have cancer of the  esophagus.  There is a problem with how food moves through your esophagus. In some cases, you may need this procedure repeated at a later time to dilate the esophagus gradually. Tell a health care provider about:  Any allergies you have.  All medicines you are taking, including vitamins, herbs, eye drops, creams, and over-the-counter medicines.  Any problems you or family members have had with anesthetic medicines.  Any blood disorders you have.  Any surgeries you have had.  Any medical conditions you have.  Any antibiotic medicines you are required to take before dental procedures.  Whether you are pregnant or may be pregnant. What are the risks? Generally, this is a safe procedure. However, problems may occur, including:  Bleeding due to a tear in the lining of the esophagus.  A hole (perforation) in the esophagus. What happens before the procedure?  Follow instructions from your health care provider about eating or drinking restrictions.  Ask your health care provider about changing or stopping your regular medicines. This is especially important if you are taking diabetes medicines or blood thinners.  Plan to have someone take you home from the hospital or clinic.  Plan to have a responsible adult care for you for at least 24 hours after you leave the hospital or clinic. This is important. What happens during the procedure?  You may be given a medicine to help you relax (sedative).  A numbing medicine may be sprayed into the back of your throat, or you may gargle the medicine.  Your health care provider may perform the dilatation using various surgical instruments, such as: ? Simple dilators. This instrument is carefully placed in the esophagus to stretch it. ? Guided wire bougies. This involves using an endoscope to insert a wire into the esophagus. A dilator is passed over this wire to enlarge the esophagus. Then the wire is removed. ? Balloon dilators. An endoscope  with a small balloon at the end is inserted into the esophagus. The balloon is inflated to stretch the esophagus and open it up. The procedure may vary among health care providers and hospitals. What happens after the procedure?  Your blood pressure, heart rate, breathing rate, and blood oxygen level will be monitored until the medicines you were given have worn off.  Your throat may feel slightly sore and numb. This will improve slowly over time.  You will not be allowed to eat or drink until your throat is no longer numb.  When you are able to drink, urinate, and sit on the edge of the bed without nausea or dizziness, you may be able to return home. Follow these instructions at home:  Take over-the-counter and prescription medicines only as told by your health care provider.  Do not drive for 24 hours if you were given a sedative during your procedure.  You should have a responsible adult with you for 24 hours after the  procedure.  Follow instructions from your health care provider about any eating or drinking restrictions.  Do not use any products that contain nicotine or tobacco, such as cigarettes and e-cigarettes. If you need help quitting, ask your health care provider.  Keep all follow-up visits as told by your health care provider. This is important. Get help right away if you:  Have a fever.  Have chest pain.  Have pain that is not relieved by medication.  Have trouble breathing.  Have trouble swallowing.  Vomit blood. Summary  Esophageal dilatation, also called esophageal dilation, is a procedure to widen or open (dilate) a blocked or narrowed part of the esophagus.  Plan to have someone take you home from the hospital or clinic.  For this procedure, a numbing medicine may be sprayed into the back of your throat, or you may gargle the medicine.  Do not drive for 24 hours if you were given a sedative during your procedure. This information is not intended to  replace advice given to you by your health care provider. Make sure you discuss any questions you have with your health care provider. Document Released: 12/07/2005 Document Revised: 09/28/2017 Document Reviewed: 08/21/2017 Elsevier Patient Education  2020 [place] After These instructions provide you with information about caring for yourself after your procedure. Your health care provider may also give you more specific instructions. Your treatment has been planned according to current medical practices, but problems sometimes occur. Call your health care provider if you have any problems or questions after your procedure. What can I expect after the procedure? After your procedure, you may:  Feel sleepy for several hours.  Feel clumsy and have poor balance for several hours.  Feel forgetful about what happened after the procedure.  Have poor judgment for several hours.  Feel nauseous or vomit.  Have a sore throat if you had a breathing tube during the procedure. Follow these instructions at home: For at least 24 hours after the procedure:      Have a responsible adult stay with you. It is important to have someone help care for you until you are awake and alert.  Rest as needed.  Do not: ? Participate in activities in which you could fall or become injured. ? Drive. ? Use heavy machinery. ? Drink alcohol. ? Take sleeping pills or medicines that cause drowsiness. ? Make important decisions or sign legal documents. ? Take care of children on your own. Eating and drinking  Follow the diet that is recommended by your health care provider.  If you vomit, drink water, juice, or soup when you can drink without vomiting.  Make sure you have little or no nausea before eating solid foods. General instructions  Take over-the-counter and prescription medicines only as told by your health care provider.  If you have sleep apnea, surgery and  certain medicines can increase your risk for breathing problems. Follow instructions from your health care provider about wearing your sleep device: ? Anytime you are sleeping, including during daytime naps. ? While taking prescription pain medicines, sleeping medicines, or medicines that make you drowsy.  If you smoke, do not smoke without supervision.  Keep all follow-up visits as told by your health care provider. This is important. Contact a health care provider if:  You keep feeling nauseous or you keep vomiting.  You feel light-headed.  You develop a rash.  You have a fever. Get help right away if:  You have trouble  breathing. Summary  For several hours after your procedure, you may feel sleepy and have poor judgment.  Have a responsible adult stay with you for at least 24 hours or until you are awake and alert. This information is not intended to replace advice given to you by your health care provider. Make sure you discuss any questions you have with your health care provider. Document Released: 02/06/2016 Document Revised: 01/14/2018 Document Reviewed: 02/06/2016 Elsevier Patient Education  2020 [employer].

## 2019-09-30 ENCOUNTER — Encounter (HOSPITAL_COMMUNITY): Payer: Self-pay

## 2019-09-30 ENCOUNTER — Other Ambulatory Visit: Payer: Self-pay

## 2019-09-30 ENCOUNTER — Encounter (HOSPITAL_COMMUNITY)
Admission: RE | Admit: 2019-09-30 | Discharge: 2019-09-30 | Disposition: A | Discharge status: Home / Self Care | Payer: Medicare HMO | Source: Ambulatory Visit | Attending: Internal Medicine | Admitting: Internal Medicine

## 2019-09-30 ENCOUNTER — Other Ambulatory Visit (HOSPITAL_COMMUNITY)
Admission: RE | Admit: 2019-09-30 | Discharge: 2019-09-30 | Disposition: A | Discharge status: Home / Self Care | Payer: Medicare HMO | Source: Ambulatory Visit | Attending: Internal Medicine | Admitting: Internal Medicine

## 2019-09-30 DIAGNOSIS — Z01812 Encounter for preprocedural laboratory examination: Secondary | ICD-10-CM | POA: Insufficient documentation

## 2019-09-30 LAB — BASIC METABOLIC PANEL
Anion gap: 10 (ref 5–15)
BUN: 13 mg/dL (ref 8–23)
CO2: 22 mmol/L (ref 22–32)
Calcium: 9 mg/dL (ref 8.9–10.3)
Chloride: 105 mmol/L (ref 98–111)
Creatinine, Ser: 0.98 mg/dL (ref 0.61–1.24)
GFR calc Af Amer: >60 mL/min (ref >60)
GFR calc non Af Amer: >60 mL/min (ref >60)
Glucose, Bld: 107 mg/dL — ABNORMAL HIGH (ref 70–99)
Potassium: 4.2 mmol/L (ref 3.5–5.1)
Sodium: 137 mmol/L (ref 135–145)

## 2019-09-30 LAB — SARS CORONAVIRUS 2 (TAT 6-24 HRS): SARS Coronavirus 2: NEGATIVE

## 2019-10-02 ENCOUNTER — Encounter: Payer: Self-pay | Admitting: Internal Medicine

## 2019-10-02 ENCOUNTER — Ambulatory Visit (HOSPITAL_COMMUNITY)
Admission: RE | Admit: 2019-10-02 | Discharge: 2019-10-02 | Disposition: A | Discharge status: Home / Self Care | Payer: Medicare HMO | Attending: Internal Medicine | Admitting: Internal Medicine

## 2019-10-02 ENCOUNTER — Encounter (HOSPITAL_COMMUNITY)
Admission: RE | Disposition: A | Discharge status: Home / Self Care | Payer: Self-pay | Source: Home / Self Care | Attending: Internal Medicine

## 2019-10-02 ENCOUNTER — Ambulatory Visit (HOSPITAL_COMMUNITY): Payer: Medicare HMO | Admitting: Anesthesiology

## 2019-10-02 ENCOUNTER — Encounter (HOSPITAL_COMMUNITY): Payer: Self-pay | Admitting: Emergency Medicine

## 2019-10-02 ENCOUNTER — Other Ambulatory Visit: Payer: Self-pay

## 2019-10-02 DIAGNOSIS — E663 Overweight: Secondary | ICD-10-CM | POA: Diagnosis not present

## 2019-10-02 DIAGNOSIS — I10 Essential (primary) hypertension: Secondary | ICD-10-CM | POA: Diagnosis not present

## 2019-10-02 DIAGNOSIS — J449 Chronic obstructive pulmonary disease, unspecified: Secondary | ICD-10-CM | POA: Insufficient documentation

## 2019-10-02 DIAGNOSIS — K219 Gastro-esophageal reflux disease without esophagitis: Secondary | ICD-10-CM | POA: Insufficient documentation

## 2019-10-02 DIAGNOSIS — I2510 Atherosclerotic heart disease of native coronary artery without angina pectoris: Secondary | ICD-10-CM | POA: Insufficient documentation

## 2019-10-02 DIAGNOSIS — Z6829 Body mass index (BMI) 29.0-29.9, adult: Secondary | ICD-10-CM | POA: Insufficient documentation

## 2019-10-02 DIAGNOSIS — F419 Anxiety disorder, unspecified: Secondary | ICD-10-CM | POA: Insufficient documentation

## 2019-10-02 DIAGNOSIS — R1310 Dysphagia, unspecified: Secondary | ICD-10-CM | POA: Insufficient documentation

## 2019-10-02 DIAGNOSIS — E785 Hyperlipidemia, unspecified: Secondary | ICD-10-CM | POA: Insufficient documentation

## 2019-10-02 DIAGNOSIS — Z888 Allergy status to other drugs, medicaments and biological substances status: Secondary | ICD-10-CM | POA: Insufficient documentation

## 2019-10-02 DIAGNOSIS — F17210 Nicotine dependence, cigarettes, uncomplicated: Secondary | ICD-10-CM | POA: Diagnosis not present

## 2019-10-02 DIAGNOSIS — Z7982 Long term (current) use of aspirin: Secondary | ICD-10-CM | POA: Diagnosis not present

## 2019-10-02 DIAGNOSIS — Z79899 Other long term (current) drug therapy: Secondary | ICD-10-CM | POA: Diagnosis not present

## 2019-10-02 HISTORY — PX: MALONEY DILATION: SHX5535

## 2019-10-02 HISTORY — PX: ESOPHAGOGASTRODUODENOSCOPY (EGD) WITH PROPOFOL: SHX5813

## 2019-10-02 SURGERY — ESOPHAGOGASTRODUODENOSCOPY (EGD) WITH PROPOFOL
Anesthesia: General

## 2019-10-02 MED ORDER — PROPOFOL 500 MG/50ML IV EMUL
INTRAVENOUS | Status: DC | PRN
  Administered 2019-10-02: 125 ug/kg/min via INTRAVENOUS

## 2019-10-02 MED ORDER — KETAMINE HCL 10 MG/ML IJ SOLN
INTRAMUSCULAR | Status: DC | PRN
  Administered 2019-10-02: 30 mg via INTRAVENOUS

## 2019-10-02 MED ORDER — HYDROCODONE-ACETAMINOPHEN 7.5-325 MG PO TABS: 1.0000 | ORAL_TABLET | Freq: Once | ORAL | Status: DC | PRN

## 2019-10-02 MED ORDER — KETAMINE HCL 50 MG/5ML IJ SOSY
PREFILLED_SYRINGE | INTRAMUSCULAR | Status: AC
  Filled 2019-10-02: qty 5

## 2019-10-02 MED ORDER — PROPOFOL 10 MG/ML IV BOLUS
INTRAVENOUS | Status: AC
  Filled 2019-10-02: qty 40

## 2019-10-02 MED ORDER — PROMETHAZINE HCL 25 MG/ML IJ SOLN: 6.2500 mg | INTRAMUSCULAR | Status: DC | PRN

## 2019-10-02 MED ORDER — PROPOFOL 10 MG/ML IV BOLUS
INTRAVENOUS | Status: DC | PRN
  Administered 2019-10-02: 50 mg via INTRAVENOUS

## 2019-10-02 MED ORDER — MIDAZOLAM HCL 2 MG/2ML IJ SOLN: 0.5000 mg | Freq: Once | INTRAMUSCULAR | Status: DC | PRN

## 2019-10-02 MED ORDER — LACTATED RINGERS IV SOLN
INTRAVENOUS | Status: DC
  Administered 2019-10-02: 07:00:00 via INTRAVENOUS

## 2019-10-02 MED ORDER — HYDROMORPHONE HCL 1 MG/ML IJ SOLN: 0.2500 mg | INTRAMUSCULAR | Status: DC | PRN

## 2019-10-02 NOTE — Discharge Instructions (Signed)
EGD Discharge instructions Please read the instructions outlined below and refer to this sheet in the next few weeks. These discharge instructions provide you with general information on caring for yourself after you leave the hospital. Your doctor may also give you specific instructions. While your treatment has been planned according to the most current medical practices available, unavoidable complications occasionally occur. If you have any problems or questions after discharge, please call your doctor. ACTIVITY  You may resume your regular activity but move at a slower pace for the next 24 hours.   Take frequent rest periods for the next 24 hours.   Walking will help expel (get rid of) the air and reduce the bloated feeling in your abdomen.   No driving for 24 hours (because of the anesthesia (medicine) used during the test).   You may shower.   Do not sign any important legal documents or operate any machinery for 24 hours (because of the anesthesia used during the test).  NUTRITION  Drink plenty of fluids.   You may resume your normal diet.   Begin with a light meal and progress to your normal diet.   Avoid alcoholic beverages for 24 hours or as instructed by your caregiver.  MEDICATIONS  You may resume your normal medications unless your caregiver tells you otherwise.  WHAT YOU CAN EXPECT TODAY  You may experience abdominal discomfort such as a feeling of fullness or gas pains.  FOLLOW-UP  Your doctor will discuss the results of your test with you.  SEEK IMMEDIATE MEDICAL ATTENTION IF ANY OF THE FOLLOWING OCCUR:  Excessive nausea (feeling sick to your stomach) and/or vomiting.   Severe abdominal pain and distention (swelling).   Trouble swallowing.   Temperature over 101 F (37.8 C).   Rectal bleeding or vomiting of blood.    GERD information provided  Continue Nexium 40 mg twice daily  Office visit in 1 year.  At patient request, I spoke to wife Vaughan Basta  at [phone].  Discussed results.

## 2019-10-02 NOTE — Transfer of Care (Signed)
Immediate Anesthesia Transfer of Care Note  Patient: Trevor Berg  Procedure(s) Performed: ESOPHAGOGASTRODUODENOSCOPY (EGD) WITH PROPOFOL (N/A ) MALONEY DILATION (N/A )  Patient Location: PACU  Anesthesia Type:MAC  Level of Consciousness: awake, alert , oriented and patient cooperative  Airway & Oxygen Therapy: Patient Spontanous Breathing  Post-op Assessment: Report given to RN and Post -op Vital signs reviewed and stable  Post vital signs: Reviewed and stable  Last Vitals:  Vitals Value Taken Time  BP    Temp    Pulse 64 10/02/19 0850  Resp 22 10/02/19 0849  SpO2 98 % 10/02/19 0850  Vitals shown include unvalidated device data.  Last Pain:  Vitals:   10/02/19 0829  TempSrc:   PainSc: 8          Complications: No apparent anesthesia complications

## 2019-10-02 NOTE — Op Note (Signed)
[facility] Patient Name: Trevor Berg Procedure Date: 10/02/2019 8:04 AM MRN: [id number] Date of Birth: 09-20-1951 Attending MD: Trevor Berg , MD CSN: [id number] Age: 68 Admit Type: Outpatient Procedure:                Upper GI endoscopy Indications:              Dysphagia Providers:                Trevor Richards, MD, Trevor Berg. Trevor Seller, RN,                            Trevor Berg Referring MD:              Medicines:                Propofol per Anesthesia Complications:            No immediate complications. Estimated Blood Loss:     Estimated blood loss: none. Estimated blood loss:                            none. Procedure:                Pre-Anesthesia Assessment:                           - Prior to the procedure, a History and Physical                            was performed, and patient medications and                            allergies were reviewed. The patient's tolerance of                            previous anesthesia was also reviewed. The risks                            and benefits of the procedure and the sedation                            options and risks were discussed with the patient.                            All questions were answered, and informed consent                            was obtained. Prior Anticoagulants: The patient has                            taken no previous anticoagulant or antiplatelet                            agents. ASA Grade Assessment: III - A patient with                            severe  systemic disease. After reviewing the risks                            and benefits, the patient was deemed in                            satisfactory condition to undergo the procedure.                           After obtaining informed consent, the endoscope was                            passed under direct vision. Throughout the                            procedure, the patient's blood pressure, pulse, and                    oxygen saturations were monitored continuously. The                            [id number]-[id number]) scope was introduced through the                            mouth, and advanced to the second part of duodenum.                            The upper GI endoscopy was accomplished without                            difficulty. The patient tolerated the procedure                            well. Scope In: 8:33:30 AM Scope Out: 8:40:21 AM Total Procedure Duration: 0 hours 6 minutes 51 seconds  Findings:      The duodenal bulb and second portion of the duodenum were normal.      The examined esophagus was normal. The scope was withdrawn. Dilation was       performed with a Maloney dilator with mild resistance at 56 Fr. The       scope was withdrawn. Dilation was performed with a Maloney dilator with       mild resistance at 61 Fr. The dilation site was examined following       endoscope reinsertion and showed no change. Estimated blood loss: none.      The entire examined stomach was normal. Impression:               - Normal duodenal bulb and second portion of the                            duodenum.                           - Normal esophagus. Dilated.                           -  Normal stomach.                           - No specimens collected. Moderate Sedation:      Moderate (conscious) sedation was personally administered by an       anesthesia professional. The following parameters were monitored: oxygen       saturation, heart rate, blood pressure, respiratory rate, EKG, adequacy       of pulmonary ventilation, and response to care. Recommendation:           - Patient has a contact number available for                            emergencies. The signs and symptoms of potential                            delayed complications were discussed with the                            patient. Return to normal activities tomorrow.                            Written discharge  instructions were provided to the                            patient.                           - Resume previous diet.                           - Continue present medications. Continue Nexium 40                            mg twice daily. Need to consider the possibility of                            an esophageal motility disorder here. A component                            of the patient symptoms may be falling into the                            realm of globus. Would consider a barium pill                            esophagram if any future recurrent dysphagia.                            Office visit with [place] in 1 year. Procedure Code(s):        --- Professional ---                           [phone], Esophagogastroduodenoscopy, flexible,  transoral; diagnostic, including collection of                            specimen(s) by brushing or washing, when performed                            (separate procedure)                           43450, Dilation of esophagus, by unguided sound or                            bougie, single or multiple passes Diagnosis Code(s):        --- Professional ---                           R13.10, Dysphagia, unspecified CPT copyright 2019 American Medical Association. All rights reserved. The codes documented in this report are preliminary and upon coder review may  be revised to meet current compliance requirements. Trevor Berg. Trevor Frommer, MD Trevor Richards, MD 10/02/2019 8:58:37 AM This report has been signed electronically. Number of Addenda: 0

## 2019-10-02 NOTE — Anesthesia Postprocedure Evaluation (Signed)
Anesthesia Post Note  Patient: Trevor Berg  Procedure(s) Performed: ESOPHAGOGASTRODUODENOSCOPY (EGD) WITH PROPOFOL (N/A ) MALONEY DILATION (N/A )  Patient location during evaluation: PACU Anesthesia Type: General Level of consciousness: awake and alert Pain management: pain level controlled Vital Signs Assessment: post-procedure vital signs reviewed and stable Respiratory status: spontaneous breathing Cardiovascular status: stable Postop Assessment: no apparent nausea or vomiting Anesthetic complications: no     Last Vitals:  Vitals:   10/02/19 0718  BP: (!) 144/83  Pulse: 62  Resp: 18  Temp: 36.6 C  SpO2: 97%    Last Pain:  Vitals:   10/02/19 0829  TempSrc:   PainSc: 8                  Everette Rank

## 2019-10-02 NOTE — Anesthesia Preprocedure Evaluation (Signed)
Anesthesia Evaluation  Patient identified by MRN, date of birth, ID band Patient awake    Reviewed: Allergy & Precautions, NPO status , Patient's Chart, lab work & pertinent test results  Airway Mallampati: II  TM Distance: >3 FB Neck ROM: Full    Dental no notable dental hx. (+) Edentulous Upper, Edentulous Lower   Pulmonary asthma , pneumonia, resolved, COPD,  COPD inhaler, Current Smoker and Patient abstained from smoking.,    Pulmonary exam normal breath sounds clear to auscultation       Cardiovascular Exercise Tolerance: Good hypertension, Pt. on medications and Pt. on home beta blockers + angina with exertion and at rest Normal cardiovascular examI Rhythm:Regular Rate:Normal  Reports NTG use -sometimes more than once a day  Last saw Dr. Harl Bowie ~04/24/2019 States in usual state of health  Reports good ET Last EGD per dr. Harl Bowie note showed mult erosions    Neuro/Psych Anxiety negative neurological ROS  negative psych ROS   GI/Hepatic Neg liver ROS, GERD  Medicated and Controlled,  Endo/Other  negative endocrine ROS  Renal/GU negative Renal ROS  negative genitourinary   Musculoskeletal negative musculoskeletal ROS (+)   Abdominal   Peds negative pediatric ROS (+)  Hematology negative hematology ROS (+)   Anesthesia Other Findings   Reproductive/Obstetrics negative OB ROS                             Anesthesia Physical Anesthesia Plan  ASA: III  Anesthesia Plan: General   Post-op Pain Management:    Induction: Intravenous  PONV Risk Score and Plan: TIVA, Propofol infusion and Treatment may vary due to age or medical condition  Airway Management Planned: Nasal Cannula and Simple Face Mask  Additional Equipment:   Intra-op Plan:   Post-operative Plan:   Informed Consent: I have reviewed the patients History and Physical, chart, labs and discussed the procedure including  the risks, benefits and alternatives for the proposed anesthesia with the patient or authorized representative who has indicated his/her understanding and acceptance.     Dental advisory given  Plan Discussed with: CRNA  Anesthesia Plan Comments: (Plan Full PPE use  Plan GA with GETA as needed d/w pt -WTP with same after Q&A )        Anesthesia Quick Evaluation

## 2019-10-02 NOTE — H&P (Signed)
@LOGO @   Primary Care Physician:  Jani Gravel, MD Primary Gastroenterologist:  Dr. Gala Romney  Pre-Procedure History & Physical: HPI:  Trevor Berg is a 68 y.o. male here for for further evaluation of esophageal dysphagia.  History of recurrent esophageal dysphagia status post multiple dilations in the past (normal-appearing esophagus June 2018) dilated to 31 [place].  Now with recurrent esophageal dysphagia reflux symptoms controlled on Nexium 40 mg twice daily.  Past Medical History:  Diagnosis Date  . Anginal pain ([place])   . Anxiety   . Asthma   . Chest pain 2005   noncritical coronary disease in 2005: 40% distal RCA, 30% CX, 50% OM 2, EF-60%,  . COPD (chronic obstructive pulmonary disease) (HCC)    Chronic bronchitis; 100-pack-year cigarette consumption  . Gastroesophageal reflux disease    possible small Schatzki's ring; esophageal dilatation in 2005  . H. pylori infection 2014   treated with prevpac  . Hyperlipidemia   . Hypertension   . Overweight(278.02)   . Tobacco abuse    100 pack years    Past Surgical History:  Procedure Laterality Date  . CERVICAL SPINE SURGERY     C5-6 and C6-7: Relief of spinal stenosis with corpectomy, foraminotomy and fusion  . COLONOSCOPY  10/19/2004   RMR:   Friable anal canal, likely source of patient's hematochezia/  Otherwise normal rectum, normal colon.  . COLONOSCOPY N/A 09/09/2015   RMR: Pancolonic diverticulosis. Multiple rectal polyps and 1 polyp in the ascending colon. Pathology with hyperplastic rectal polyps and tubular adenoma colon polyp. Repeat in 2021.  . ESOPHAGEAL DILATION N/A 09/09/2015   Procedure: ESOPHAGEAL DILATION;  Surgeon: Daneil Dolin, MD;  [facility];  Service: Endoscopy;  Laterality: N/A;  . ESOPHAGOGASTRODUODENOSCOPY  09/27/2004   RMR:   A couple of tiny, distal esophageal erosions, otherwise normal esophagus aside from a Schatzki ring status post Maloney dilation as described above/ Small hiatal hernia,  otherwise normal stomach, normal D1-D2  . ESOPHAGOGASTRODUODENOSCOPY N/A 09/09/2015   Procedure: ESOPHAGOGASTRODUODENOSCOPY (EGD);  Surgeon: Daneil Dolin, MD;  [facility];  Service: Endoscopy;  Laterality: N/A;  . ESOPHAGOGASTRODUODENOSCOPY N/A 04/19/2017   RMR: Normal esophagus. Dilated. Erosive gastropathy. Biopsied. Otherwise normal. Pathology with mild chronic gastritis. No H. pylori  . ESOPHAGOGASTRODUODENOSCOPY (EGD) WITH ESOPHAGEAL DILATION N/A 03/19/2013   [id number] esophagus s/p dilation/Abnormal stomach of uncertain clinical significance-s/p bx positive for H. pylori, status post Prevpac treatment  . KNEE ARTHROSCOPY WITH MEDIAL MENISECTOMY Right 12/06/2017   Procedure: RIGHT KNEE ARTHROSCOPY WITH PARTIAL MEDIAL MENISECTOMY; REMOVAL OF LOOSE BODY;  Surgeon: Carole Civil, MD;  Location: [facility];  Service: Orthopedics;  Laterality: Right;  . MALONEY DILATION N/A 04/19/2017   Procedure: Venia Minks DILATION;  Surgeon: Daneil Dolin, MD;  [facility];  Service: Endoscopy;  Laterality: N/A;  . ORIF ANKLE FRACTURE     right  . ORIF TIBIA FRACTURE     Left  . TONSILLECTOMY      Prior to Admission medications   Medication Sig Start Date End Date Taking? Authorizing Provider  albuterol (PROVENTIL HFA;VENTOLIN HFA) 108 (90 BASE) MCG/ACT inhaler Inhale 2 puffs into the lungs every 6 (six) hours as needed for shortness of breath.    Yes [provider]  amLODipine (NORVASC) 2.5 MG tablet Take 2.5 mg by mouth daily.   Yes [provider]  ANORO ELLIPTA 62.5-25 MCG/INH AEPB Inhale 1 puff into the lungs daily.  03/08/15  Yes [provider]  aspirin EC 81 MG tablet Take 81 mg by mouth daily.   Yes [provider]  atenolol (TENORMIN) 50 MG tablet TAKE ONE TABLET BY MOUTH ONCE DAILY IN THE MORNING 07/27/16  Yes Branch, Alphonse Guild, MD  dextromethorphan-guaiFENesin [facility] DM) 30-600 MG 12hr tablet Take 1 tablet by mouth 2 (two) times  daily.   Yes [provider]  esomeprazole (NEXIUM) 40 MG capsule Take 1 capsule (40 mg total) by mouth 2 (two) times daily before a meal. 07/23/19  Yes Jodi Mourning, Kristen S, PA-C  fluticasone (FLONASE) 50 MCG/ACT nasal spray Place 2 sprays into both nostrils daily.   Yes [provider]  hydrochlorothiazide (MICROZIDE) 12.5 MG capsule TAKE 1 CAPSULE BY MOUTH ONCE A DAY FOR HTN/SWELLING 06/29/17  Yes [provider]  lisinopril (PRINIVIL,ZESTRIL) 40 MG tablet Take 1 tablet (40 mg total) by mouth daily. 11/06/16  Yes Branch, Alphonse Guild, MD  LORazepam (ATIVAN) 1 MG tablet Take 1 mg by mouth 3 (three) times daily as needed for anxiety.    Yes [provider]  lubiprostone ([name]) 24 MCG capsule Take 1 capsule (24 mcg total) by mouth 2 (two) times daily with a meal. 07/23/19  Yes Jodi Mourning, Kristen S, PA-C  Naphazoline HCl (CLEAR EYES OP) Apply 1 drop to eye daily as needed (dry eyes).   Yes [provider]  nitroGLYCERIN (NITROSTAT) 0.4 MG SL tablet Place 0.4 mg under the tongue every 5 (five) minutes as needed for chest pain. Chest Pain   Yes [provider]  sertraline (ZOLOFT) 50 MG tablet Take 50 mg by mouth daily.   Yes [provider]  simvastatin (ZOCOR) 20 MG tablet Take 20 mg by mouth daily.   Yes [provider]  traZODone (DESYREL) 50 MG tablet Take 50 mg by mouth at bedtime. 04/09/17  Yes [provider]  polyethylene glycol powder (GLYCOLAX/MIRALAX) powder USE 1 CAPFUL DAILY AS NEEDED FOR CONSTIPATION Patient taking differently: USE 1 CAPFUL DAILY 11/08/16   Annitta Needs, NP  vitamin B-12 (CYANOCOBALAMIN) 1000 MCG tablet Take 2,000 mcg by mouth daily.     [provider]    Allergies as of 07/23/2019 - Review Complete 07/23/2019  Allergen Reaction Noted  . Chantix [varenicline]  02/23/2014  . Tomato Rash 06/16/2011    Family History  Problem Relation Age of Onset  . Heart attack Mother        deceased,  age 24s  . Ulcers Brother   . Diabetes Brother   . Hypertension Other   . Colon cancer Neg Hx     Social History   Socioeconomic History  . Marital status: Married    Spouse name: Not on file  . Number of children: 3  . Years of education: Not on file  . Highest education level: Not on file  Occupational History  . Occupation: disability  Social Needs  . Financial resource strain: Not on file  . Food insecurity    Worry: Not on file    Inability: Not on file  . Transportation needs    Medical: Not on file    Non-medical: Not on file  Tobacco Use  . Smoking status: Current Every Day Smoker    Packs/day: 1.50    Years: 50.00    Pack years: 75.00    Types: Cigarettes  . Smokeless tobacco: Never Used  . Tobacco comment: 1 pack per day as of 08/2012  Substance and Sexual Activity  . Alcohol use: No  Alcohol/week: 0.0 standard drinks    Comment: occasionally, usually holidays but sometimes on weekends, prior heavy use  . Drug use: No  . Sexual activity: Yes    Birth control/protection: None  Lifestyle  . Physical activity    Days per week: Not on file    Minutes per session: Not on file  . Stress: Not on file  Relationships  . Social Herbalist on phone: Not on file    Gets together: Not on file    Attends religious service: Not on file    Active member of club or organization: Not on file    Attends meetings of clubs or organizations: Not on file    Relationship status: Not on file  . Intimate partner violence    Fear of current or ex partner: Not on file    Emotionally abused: Not on file    Physically abused: Not on file    Forced sexual activity: Not on file  Other Topics Concern  . Not on file  Social History Narrative  . Not on file    Review of Systems: See HPI, otherwise negative ROS  Physical Exam: BP (!) 144/83   Pulse 62   Temp 97.9 F (36.6 C) (Oral)   Resp 18   Ht 5\' 6"  (1.676 m)   Wt 83.9 kg   SpO2 97%   BMI 29.86 kg/m   General:   Alert,  Well-developed, well-nourished, pleasant and cooperative in NAD SMouth:  No deformity or lesions. Neck:  Supple; no masses or thyromegaly. No significant cervical adenopathy. Lungs:  Clear throughout to auscultation.   No wheezes, crackles, or rhonchi. No acute distress. Heart:  Regular rate and rhythm; no murmurs, clicks, rubs,  or gallops. Abdomen: Non-distended, normal bowel sounds.  Soft and nontender without appreciable mass or hepatosplenomegaly.   Impression/Plan: 68 year old gentleman with longstanding GERD recurrent esophageal dysphagia.  He is responded to empiric dilation previously although no mechanical/obstructing lesion seen Here for repeat EGD with dilation as appropriate/feasible. The risks, benefits, limitations, alternatives and imponderables have been reviewed with the patient. Potential for esophageal dilation, biopsy, etc. have also been reviewed.  Questions have been answered. All parties agreeable.     Notice: This dictation was prepared with Dragon dictation along with smaller phrase technology. Any transcriptional errors that result from this process are unintentional and may not be corrected upon review.

## 2019-10-06 ENCOUNTER — Encounter (HOSPITAL_COMMUNITY): Payer: Self-pay | Admitting: Internal Medicine

## 2019-11-19 ENCOUNTER — Telehealth: Payer: Self-pay

## 2019-11-19 NOTE — Telephone Encounter (Signed)
Lmom, waiting on a return call. Pt was given a tempory supply of [name] due to it not being covered under pts plan. Medications that are covered are Lactulose and [name]. Pt failed [name]. Waiting on a return call from pt.

## 2019-12-24 ENCOUNTER — Telehealth: Payer: Self-pay | Admitting: Internal Medicine

## 2019-12-24 NOTE — Telephone Encounter (Signed)
PATIENT STATED THAT AMITEZA IS NOT COVERED AT PHARMACY  PLEASE CALL PATIENT

## 2019-12-29 NOTE — Telephone Encounter (Signed)
PA was submitted through [email or web address]. waiting on an approval or denial.  

## 2019-12-30 NOTE — Telephone Encounter (Signed)
PA for [name] has been approved through 10/29/2020. Pt's copay is $9.20. spoke with pts spouse, she is aware of approval.

## 2020-01-14 ENCOUNTER — Other Ambulatory Visit: Payer: Self-pay | Admitting: Gastroenterology

## 2020-01-14 DIAGNOSIS — K219 Gastro-esophageal reflux disease without esophagitis: Secondary | ICD-10-CM

## 2020-01-27 ENCOUNTER — Other Ambulatory Visit: Payer: Self-pay | Admitting: Gastroenterology

## 2020-01-27 DIAGNOSIS — K5909 Other constipation: Secondary | ICD-10-CM

## 2020-06-01 ENCOUNTER — Telehealth: Payer: Self-pay | Admitting: Internal Medicine

## 2020-06-01 NOTE — Telephone Encounter (Signed)
Pts spouse returned call. Pts insurance only wants to cover Nexium twice daily. Will look to see if a PA is needed. Pt is aware that I will contact him after looking into coverage of Nexium.

## 2020-06-01 NOTE — Telephone Encounter (Signed)
Called pt. I was told I would receive a call back from someone that picked the phone up.

## 2020-06-01 NOTE — Telephone Encounter (Signed)
[phone] please call patient about her prescription

## 2020-07-19 ENCOUNTER — Other Ambulatory Visit: Payer: Self-pay | Admitting: Gastroenterology

## 2020-07-19 DIAGNOSIS — K5909 Other constipation: Secondary | ICD-10-CM

## 2020-08-17 NOTE — Telephone Encounter (Signed)
PA was approved for Nexium on 07/23/20 through [email or web address]

## 2020-08-31 ENCOUNTER — Other Ambulatory Visit: Payer: Self-pay

## 2020-08-31 ENCOUNTER — Encounter: Payer: Self-pay | Admitting: Internal Medicine

## 2020-08-31 ENCOUNTER — Ambulatory Visit: Payer: Medicare HMO | Admitting: Internal Medicine

## 2020-08-31 VITALS — BP 127/70 | HR 68 | Temp 97.1°F | Ht 66.0 in | Wt 188.6 lb

## 2020-08-31 DIAGNOSIS — K5909 Other constipation: Secondary | ICD-10-CM | POA: Diagnosis not present

## 2020-08-31 DIAGNOSIS — Z860100 Personal history of colon polyps, unspecified: Secondary | ICD-10-CM

## 2020-08-31 DIAGNOSIS — K219 Gastro-esophageal reflux disease without esophagitis: Secondary | ICD-10-CM

## 2020-08-31 MED ORDER — ESOMEPRAZOLE MAGNESIUM 40 MG PO CPDR: 40.0000 mg | DELAYED_RELEASE_CAPSULE | Freq: Two times a day (BID) | ORAL | 3 refills | Status: DC

## 2020-08-31 NOTE — H&P (View-Only) (Signed)
Primary Care Physician:  Jani Gravel, MD Primary Gastroenterologist:  Dr. Gala Romney  Pre-Procedure History & Physical: HPI:  Trevor Berg is a 69 y.o. male here for follow-up of constipation and GERD.  States it taking [name] 24 mcg twice daily along with as needed MiraLAX has not been very effective in accomplishing adequate bowel habits.  He states he may go 3 days without a bowel movement and then has a watery unsatisfactory move.  He is not had any melena or rectal bleeding.  No abdominal pain.  Previously tried [name].  Colonoscopy 2016 yielded hyperplastic and adenomatous polyps; due for surveillance examination now. Longstanding reflux historically well controlled on Nexium 40 mg twice daily.  Patient states insurance disapproved and then subsequently approved continuing Nexium.  He states he never got a prescription.  Felt back to Protonix 40 mg once daily which has not been nearly as effective in dealing with his abdominal pain.  No dysphagia.  No nausea or vomiting.  Is not taking any opioids.  He does take Norvasc. Prior history of esophageal dysphagia.  Esophagus empirically dilated at EGD last year.  Has had no further dysphagia issues.    Past Medical History:  Diagnosis Date   Anginal pain ([place])    Anxiety    Asthma    Chest pain 2005   noncritical coronary disease in 2005: 40% distal RCA, 30% CX, 50% OM 2, EF-60%,   COPD (chronic obstructive pulmonary disease) (HCC)    Chronic bronchitis; 100-pack-year cigarette consumption   Gastroesophageal reflux disease    possible small Schatzki's ring; esophageal dilatation in 2005   H. pylori infection 2014   treated with prevpac   Hyperlipidemia    Hypertension    Overweight(278.02)    Tobacco abuse    100 pack years    Past Surgical History:  Procedure Laterality Date   CERVICAL SPINE SURGERY     C5-6 and C6-7: Relief of spinal stenosis with corpectomy, foraminotomy and fusion   COLONOSCOPY  10/19/2004    RMR:   Friable anal canal, likely source of patient's hematochezia/  Otherwise normal rectum, normal colon.   COLONOSCOPY N/A 09/09/2015   RMR: Pancolonic diverticulosis. Multiple rectal polyps and 1 polyp in the ascending colon. Pathology with hyperplastic rectal polyps and tubular adenoma colon polyp. Repeat in 2021.   ESOPHAGEAL DILATION N/A 09/09/2015   Procedure: ESOPHAGEAL DILATION;  Surgeon: Daneil Dolin, MD;  [facility];  Service: Endoscopy;  Laterality: N/A;   ESOPHAGOGASTRODUODENOSCOPY  09/27/2004   RMR:   A couple of tiny, distal esophageal erosions, otherwise normal esophagus aside from a Schatzki ring status post Maloney dilation as described above/ Small hiatal hernia, otherwise normal stomach, normal D1-D2   ESOPHAGOGASTRODUODENOSCOPY N/A 09/09/2015   Procedure: ESOPHAGOGASTRODUODENOSCOPY (EGD);  Surgeon: Daneil Dolin, MD;  [facility];  Service: Endoscopy;  Laterality: N/A;   ESOPHAGOGASTRODUODENOSCOPY N/A 04/19/2017   RMR: Normal esophagus. Dilated. Erosive gastropathy. Biopsied. Otherwise normal. Pathology with mild chronic gastritis. No H. pylori   ESOPHAGOGASTRODUODENOSCOPY (EGD) WITH ESOPHAGEAL DILATION N/A 03/19/2013   BJY:NWGNFA esophagus s/p dilation/Abnormal stomach of uncertain clinical significance-s/p bx positive for H. pylori, status post Prevpac treatment   ESOPHAGOGASTRODUODENOSCOPY (EGD) WITH PROPOFOL N/A 10/02/2019   Procedure: ESOPHAGOGASTRODUODENOSCOPY (EGD) WITH PROPOFOL;  Surgeon: Daneil Dolin, MD;  [facility];  Service: Endoscopy;  Laterality: N/A;  8:30am   KNEE ARTHROSCOPY WITH MEDIAL MENISECTOMY Right 12/06/2017   Procedure: RIGHT KNEE ARTHROSCOPY WITH PARTIAL MEDIAL MENISECTOMY;  REMOVAL OF LOOSE BODY;  Surgeon: Carole Civil, MD;  Location: [facility];  Service: Orthopedics;  Laterality: Right;   MALONEY DILATION N/A 04/19/2017   Procedure: Venia Minks DILATION;  Surgeon: Daneil Dolin, MD;  [facility];  Service: Endoscopy;  Laterality: N/A;   MALONEY DILATION N/A 10/02/2019   Procedure: Venia Minks DILATION;  Surgeon: Daneil Dolin, MD;  [facility];  Service: Endoscopy;  Laterality: N/A;   ORIF ANKLE FRACTURE     right   ORIF TIBIA FRACTURE     Left   TONSILLECTOMY      Prior to Admission medications   Medication Sig Start Date End Date Taking? Authorizing Provider  albuterol (PROVENTIL HFA;VENTOLIN HFA) 108 (90 BASE) MCG/ACT inhaler Inhale 2 puffs into the lungs every 6 (six) hours as needed for shortness of breath.    Yes [provider]  [name] 24 MCG capsule TAKE 1 CAPSULE (24 MCG TOTAL) BY MOUTH 2 (TWO) TIMES DAILY WITH A MEAL. 07/19/20  Yes Annitta Needs, NP  amLODipine (NORVASC) 2.5 MG tablet Take 2.5 mg by mouth daily.   Yes [provider]  ANORO ELLIPTA 62.5-25 MCG/INH AEPB Inhale 1 puff into the lungs daily.  03/08/15  Yes [provider]  aspirin EC 81 MG tablet Take 81 mg by mouth daily.   Yes [provider]  atenolol (TENORMIN) 50 MG tablet TAKE ONE TABLET BY MOUTH ONCE DAILY IN THE MORNING 07/27/16  Yes Branch, Alphonse Guild, MD  dextromethorphan-guaiFENesin [facility] DM) 30-600 MG 12hr tablet Take 1 tablet by mouth 2 (two) times daily.   Yes [provider]  Ergocalciferol (VITAMIN D2) 50 MCG (2000 UT) TABS Take 1 tablet by mouth daily.   Yes [provider]  fluticasone (FLONASE) 50 MCG/ACT nasal spray Place 2 sprays into both nostrils daily.   Yes [provider]  hydrochlorothiazide (MICROZIDE) 12.5 MG capsule TAKE 1 CAPSULE BY MOUTH ONCE A DAY FOR HTN/SWELLING 06/29/17  Yes [provider]  lisinopril (PRINIVIL,ZESTRIL) 40 MG tablet Take 1 tablet (40 mg total) by mouth daily. 11/06/16  Yes Branch, Alphonse Guild, MD  LORazepam (ATIVAN) 1 MG tablet Take 1 mg by mouth 3 (three) times daily as needed for anxiety.    Yes [provider]  Naphazoline HCl (CLEAR EYES OP) Apply 1 drop to  eye daily as needed (dry eyes).   Yes [provider]  nitroGLYCERIN (NITROSTAT) 0.4 MG SL tablet Place 0.4 mg under the tongue every 5 (five) minutes as needed for chest pain. Chest Pain   Yes [provider]  pantoprazole (PROTONIX) 40 MG tablet Take 40 mg by mouth daily. 07/31/20  Yes [provider]  polyethylene glycol powder (GLYCOLAX/MIRALAX) powder USE 1 CAPFUL DAILY AS NEEDED FOR CONSTIPATION Patient taking differently: USE 1 CAPFUL DAILY 11/08/16  Yes Annitta Needs, NP  sertraline (ZOLOFT) 50 MG tablet Take 50 mg by mouth daily.   Yes [provider]  simvastatin (ZOCOR) 20 MG tablet Take 20 mg by mouth daily.   Yes [provider]  traZODone (DESYREL) 50 MG tablet Take 50 mg by mouth at bedtime. 04/09/17  Yes [provider]  vitamin B-12 (CYANOCOBALAMIN) 1000 MCG tablet Take 1,000 mcg by mouth daily.    Yes [provider]  zinc gluconate 50 MG tablet Take 50 mg by mouth daily.   Yes [provider]    Allergies as of 08/31/2020 - Review Complete 08/31/2020  Allergen Reaction Noted  Chantix [varenicline]  02/23/2014   Tomato Rash 06/16/2011    Family History  Problem Relation Age of Onset   Heart attack Mother        deceased, age 43s   Ulcers Brother    Diabetes Brother    Hypertension Other    Colon cancer Neg Hx     Social History   Socioeconomic History   Marital status: Married    Spouse name: Not on file   Number of children: 3   Years of education: Not on file   Highest education level: Not on file  Occupational History   Occupation: disability  Tobacco Use   Smoking status: Current Every Day Smoker    Packs/day: 1.50    Years: 50.00    Pack years: 75.00    Types: Cigarettes   Smokeless tobacco: Never Used   Tobacco comment: 1 pack per day as of 08/2012  Vaping Use   Vaping Use: Never used  Substance and Sexual Activity   Alcohol use: No    Alcohol/week: 0.0  standard drinks    Comment: occasionally, usually holidays but sometimes on weekends, prior heavy use   Drug use: No   Sexual activity: Yes    Birth control/protection: None  Other Topics Concern   Not on file  Social History Narrative   Not on file   Social Determinants of Health   Financial Resource Strain:    Difficulty of Paying Living Expenses: Not on file  Food Insecurity:    Worried About Charity fundraiser in the Last Year: Not on file   [employer] of Food in the Last Year: Not on file  Transportation Needs:    Lack of Transportation (Medical): Not on file   Lack of Transportation (Non-Medical): Not on file  Physical Activity:    Days of Exercise per Week: Not on file   Minutes of Exercise per Session: Not on file  Stress:    Feeling of Stress : Not on file  Social Connections:    Frequency of Communication with Friends and Family: Not on file   Frequency of Social Gatherings with Friends and Family: Not on file   Attends Religious Services: Not on file   Active Member of Clubs or Organizations: Not on file   Attends Archivist Meetings: Not on file   Marital Status: Not on file  Intimate Partner Violence:    Fear of Current or Ex-Partner: Not on file   Emotionally Abused: Not on file   Physically Abused: Not on file   Sexually Abused: Not on file    Review of Systems: See HPI, otherwise negative ROS  Physical Exam: BP 127/70    Pulse 68    Temp (!) 97.1 F (36.2 C) (Temporal)    Ht 5\' 6"  (1.676 m)    Wt 188 lb 9.6 oz (85.5 kg)    BMI 30.44 kg/m  General:   Alert,  pleasant and cooperative in NAD Neck:  Supple; no masses or thyromegaly. No significant cervical adenopathy. Lungs:  Clear throughout to auscultation.   No wheezes, crackles, or rhonchi. No acute distress. Heart:  Regular rate and rhythm; no murmurs, clicks, rubs,  or gallops. Abdomen: Non-distended, normal bowel sounds.  Soft and nontender without appreciable mass or  hepatosplenomegaly.  Pulses:  Normal pulses noted. Extremities:  Without clubbing or edema.  Impression/Plan: 69 year old gentleman with apparent refractory constipation, history colonic adenoma; Due for surveillance colonoscopy at this time.  Constipation symptoms impressively  refractive to [name] and more recently [name].  Symptoms more likely related to possible functional outlet obstruction rather than colonic inertia. At any rate, history of colonic adenoma for which she is due for surveillance colonoscopy at this time.  GERD suboptimally controlled on Protonix; previously was prescribed Nexium which is working much better for him.  No dysphagia or other alarm symptoms at this time.  Recommendations:  Stop Protonix;  Begin nexium 40 mg twice daily (before breakfast and supper) - disp 60 with 3 refills  Stop [name];  Begin miralax 1 capful in 8 oz of water twice daily - everyday  I have offered the patient a surveillance colonoscopy per plan.   The risks, benefits, limitations, alternatives and imponderables have been reviewed with the patient. Questions have been answered. All parties are agreeable.  Will schedule a surveillance colonoscopy (hx of polyps)  ASA-3 - propofol  Get denture when feasible       Notice: This dictation was prepared with Dragon dictation along with smaller phrase technology. Any transcriptional errors that result from this process are unintentional and may not be corrected upon review.

## 2020-08-31 NOTE — Patient Instructions (Signed)
Stop Protonix;  Begin nexium 40 mg twice daily (before breakfast and supper) - disp 60 with 3 refills  Stop [name];  Begin miralax 1 capful in 8 oz of water twice daily - everyday  Schedule a surveillance colonoscopy (hx of polyps)  ASA-3 - propofol  Get denture when feasible

## 2020-08-31 NOTE — Progress Notes (Signed)
Primary Care Physician:  Jani Gravel, MD Primary Gastroenterologist:  Dr. Gala Romney  Pre-Procedure History & Physical: HPI:  Trevor Berg is a 69 y.o. male here for follow-up of constipation and GERD.  States it taking [name] 24 mcg twice daily along with as needed MiraLAX has not been very effective in accomplishing adequate bowel habits.  He states he may go 3 days without a bowel movement and then has a watery unsatisfactory move.  He is not had any melena or rectal bleeding.  No abdominal pain.  Previously tried [name].  Colonoscopy 2016 yielded hyperplastic and adenomatous polyps; due for surveillance examination now. Longstanding reflux historically well controlled on Nexium 40 mg twice daily.  Patient states insurance disapproved and then subsequently approved continuing Nexium.  He states he never got a prescription.  Felt back to Protonix 40 mg once daily which has not been nearly as effective in dealing with his abdominal pain.  No dysphagia.  No nausea or vomiting.  Is not taking any opioids.  He does take Norvasc. Prior history of esophageal dysphagia.  Esophagus empirically dilated at EGD last year.  Has had no further dysphagia issues.    Past Medical History:  Diagnosis Date  . Anginal pain ([place])   . Anxiety   . Asthma   . Chest pain 2005   noncritical coronary disease in 2005: 40% distal RCA, 30% CX, 50% OM 2, EF-60%,  . COPD (chronic obstructive pulmonary disease) (HCC)    Chronic bronchitis; 100-pack-year cigarette consumption  . Gastroesophageal reflux disease    possible small Schatzki's ring; esophageal dilatation in 2005  . H. pylori infection 2014   treated with prevpac  . Hyperlipidemia   . Hypertension   . Overweight(278.02)   . Tobacco abuse    100 pack years    Past Surgical History:  Procedure Laterality Date  . CERVICAL SPINE SURGERY     C5-6 and C6-7: Relief of spinal stenosis with corpectomy, foraminotomy and fusion  . COLONOSCOPY  10/19/2004    RMR:   Friable anal canal, likely source of patient's hematochezia/  Otherwise normal rectum, normal colon.  . COLONOSCOPY N/A 09/09/2015   RMR: Pancolonic diverticulosis. Multiple rectal polyps and 1 polyp in the ascending colon. Pathology with hyperplastic rectal polyps and tubular adenoma colon polyp. Repeat in 2021.  . ESOPHAGEAL DILATION N/A 09/09/2015   Procedure: ESOPHAGEAL DILATION;  Surgeon: Daneil Dolin, MD;  [facility];  Service: Endoscopy;  Laterality: N/A;  . ESOPHAGOGASTRODUODENOSCOPY  09/27/2004   RMR:   A couple of tiny, distal esophageal erosions, otherwise normal esophagus aside from a Schatzki ring status post Maloney dilation as described above/ Small hiatal hernia, otherwise normal stomach, normal D1-D2  . ESOPHAGOGASTRODUODENOSCOPY N/A 09/09/2015   Procedure: ESOPHAGOGASTRODUODENOSCOPY (EGD);  Surgeon: Daneil Dolin, MD;  [facility];  Service: Endoscopy;  Laterality: N/A;  . ESOPHAGOGASTRODUODENOSCOPY N/A 04/19/2017   RMR: Normal esophagus. Dilated. Erosive gastropathy. Biopsied. Otherwise normal. Pathology with mild chronic gastritis. No H. pylori  . ESOPHAGOGASTRODUODENOSCOPY (EGD) WITH ESOPHAGEAL DILATION N/A 03/19/2013   YIR:SWNIOE esophagus s/p dilation/Abnormal stomach of uncertain clinical significance-s/p bx positive for H. pylori, status post Prevpac treatment  . ESOPHAGOGASTRODUODENOSCOPY (EGD) WITH PROPOFOL N/A 10/02/2019   Procedure: ESOPHAGOGASTRODUODENOSCOPY (EGD) WITH PROPOFOL;  Surgeon: Daneil Dolin, MD;  [facility];  Service: Endoscopy;  Laterality: N/A;  8:30am  . KNEE ARTHROSCOPY WITH MEDIAL MENISECTOMY Right 12/06/2017   Procedure: RIGHT KNEE ARTHROSCOPY WITH PARTIAL MEDIAL MENISECTOMY;  REMOVAL OF LOOSE BODY;  Surgeon: Carole Civil, MD;  Location: [facility];  Service: Orthopedics;  Laterality: Right;  . MALONEY DILATION N/A 04/19/2017   Procedure: Venia Minks DILATION;  Surgeon: Daneil Dolin, MD;  [facility];  Service: Endoscopy;  Laterality: N/A;  . Venia Minks DILATION N/A 10/02/2019   Procedure: Venia Minks DILATION;  Surgeon: Daneil Dolin, MD;  [facility];  Service: Endoscopy;  Laterality: N/A;  . ORIF ANKLE FRACTURE     right  . ORIF TIBIA FRACTURE     Left  . TONSILLECTOMY      Prior to Admission medications   Medication Sig Start Date End Date Taking? Authorizing Provider  albuterol (PROVENTIL HFA;VENTOLIN HFA) 108 (90 BASE) MCG/ACT inhaler Inhale 2 puffs into the lungs every 6 (six) hours as needed for shortness of breath.    Yes [provider]  [name] 24 MCG capsule TAKE 1 CAPSULE (24 MCG TOTAL) BY MOUTH 2 (TWO) TIMES DAILY WITH A MEAL. 07/19/20  Yes Annitta Needs, NP  amLODipine (NORVASC) 2.5 MG tablet Take 2.5 mg by mouth daily.   Yes [provider]  ANORO ELLIPTA 62.5-25 MCG/INH AEPB Inhale 1 puff into the lungs daily.  03/08/15  Yes [provider]  aspirin EC 81 MG tablet Take 81 mg by mouth daily.   Yes [provider]  atenolol (TENORMIN) 50 MG tablet TAKE ONE TABLET BY MOUTH ONCE DAILY IN THE MORNING 07/27/16  Yes Branch, Alphonse Guild, MD  dextromethorphan-guaiFENesin [place] DM) 30-600 MG 12hr tablet Take 1 tablet by mouth 2 (two) times daily.   Yes [provider]  Ergocalciferol (VITAMIN D2) 50 MCG (2000 UT) TABS Take 1 tablet by mouth daily.   Yes [provider]  fluticasone (FLONASE) 50 MCG/ACT nasal spray Place 2 sprays into both nostrils daily.   Yes [provider]  hydrochlorothiazide (MICROZIDE) 12.5 MG capsule TAKE 1 CAPSULE BY MOUTH ONCE A DAY FOR HTN/SWELLING 06/29/17  Yes [provider]  lisinopril (PRINIVIL,ZESTRIL) 40 MG tablet Take 1 tablet (40 mg total) by mouth daily. 11/06/16  Yes Branch, Alphonse Guild, MD  LORazepam (ATIVAN) 1 MG tablet Take 1 mg by mouth 3 (three) times daily as needed for anxiety.    Yes [provider]  Naphazoline HCl (CLEAR EYES OP) Apply 1 drop to  eye daily as needed (dry eyes).   Yes [provider]  nitroGLYCERIN (NITROSTAT) 0.4 MG SL tablet Place 0.4 mg under the tongue every 5 (five) minutes as needed for chest pain. Chest Pain   Yes [provider]  pantoprazole (PROTONIX) 40 MG tablet Take 40 mg by mouth daily. 07/31/20  Yes [provider]  polyethylene glycol powder (GLYCOLAX/MIRALAX) powder USE 1 CAPFUL DAILY AS NEEDED FOR CONSTIPATION Patient taking differently: USE 1 CAPFUL DAILY 11/08/16  Yes Annitta Needs, NP  sertraline (ZOLOFT) 50 MG tablet Take 50 mg by mouth daily.   Yes [provider]  simvastatin (ZOCOR) 20 MG tablet Take 20 mg by mouth daily.   Yes [provider]  traZODone (DESYREL) 50 MG tablet Take 50 mg by mouth at bedtime. 04/09/17  Yes [provider]  vitamin B-12 (CYANOCOBALAMIN) 1000 MCG tablet Take 1,000 mcg by mouth daily.    Yes [provider]  zinc gluconate 50 MG tablet Take 50 mg by mouth daily.   Yes [provider]    Allergies as of 08/31/2020 - Review Complete 08/31/2020  Allergen Reaction Noted  .  Chantix [varenicline]  02/23/2014  . Tomato Rash 06/16/2011    Family History  Problem Relation Age of Onset  . Heart attack Mother        deceased, age 3s  . Ulcers Brother   . Diabetes Brother   . Hypertension Other   . Colon cancer Neg Hx     Social History   Socioeconomic History  . Marital status: Married    Spouse name: Not on file  . Number of children: 3  . Years of education: Not on file  . Highest education level: Not on file  Occupational History  . Occupation: disability  Tobacco Use  . Smoking status: Current Every Day Smoker    Packs/day: 1.50    Years: 50.00    Pack years: 75.00    Types: Cigarettes  . Smokeless tobacco: Never Used  . Tobacco comment: 1 pack per day as of 08/2012  Vaping Use  . Vaping Use: Never used  Substance and Sexual Activity  . Alcohol use: No    Alcohol/week: 0.0  standard drinks    Comment: occasionally, usually holidays but sometimes on weekends, prior heavy use  . Drug use: No  . Sexual activity: Yes    Birth control/protection: None  Other Topics Concern  . Not on file  Social History Narrative  . Not on file   Social Determinants of Health   Financial Resource Strain:   . Difficulty of Paying Living Expenses: Not on file  Food Insecurity:   . Worried About Charity fundraiser in the Last Year: Not on file  . Ran Out of Food in the Last Year: Not on file  Transportation Needs:   . Lack of Transportation (Medical): Not on file  . Lack of Transportation (Non-Medical): Not on file  Physical Activity:   . Days of Exercise per Week: Not on file  . Minutes of Exercise per Session: Not on file  Stress:   . Feeling of Stress : Not on file  Social Connections:   . Frequency of Communication with Friends and Family: Not on file  . Frequency of Social Gatherings with Friends and Family: Not on file  . Attends Religious Services: Not on file  . Active Member of Clubs or Organizations: Not on file  . Attends Archivist Meetings: Not on file  . Marital Status: Not on file  Intimate Partner Violence:   . Fear of Current or Ex-Partner: Not on file  . Emotionally Abused: Not on file  . Physically Abused: Not on file  . Sexually Abused: Not on file    Review of Systems: See HPI, otherwise negative ROS  Physical Exam: BP 127/70   Pulse 68   Temp (!) 97.1 F (36.2 C) (Temporal)   Ht 5\' 6"  (1.676 m)   Wt 188 lb 9.6 oz (85.5 kg)   BMI 30.44 kg/m  General:   Alert,  pleasant and cooperative in NAD Neck:  Supple; no masses or thyromegaly. No significant cervical adenopathy. Lungs:  Clear throughout to auscultation.   No wheezes, crackles, or rhonchi. No acute distress. Heart:  Regular rate and rhythm; no murmurs, clicks, rubs,  or gallops. Abdomen: Non-distended, normal bowel sounds.  Soft and nontender without appreciable mass or  hepatosplenomegaly.  Pulses:  Normal pulses noted. Extremities:  Without clubbing or edema.  Impression/Plan: 69 year old gentleman with apparent refractory constipation, history colonic adenoma; Due for surveillance colonoscopy at this time.  Constipation symptoms impressively refractive to [name] and more  recently [name].  Symptoms more likely related to possible functional outlet obstruction rather than colonic inertia. At any rate, history of colonic adenoma for which she is due for surveillance colonoscopy at this time.  GERD suboptimally controlled on Protonix; previously was prescribed Nexium which is working much better for him.  No dysphagia or other alarm symptoms at this time.  Recommendations:  Stop Protonix;  Begin nexium 40 mg twice daily (before breakfast and supper) - disp 60 with 3 refills  Stop [name];  Begin miralax 1 capful in 8 oz of water twice daily - everyday  I have offered the patient a surveillance colonoscopy per plan.   The risks, benefits, limitations, alternatives and imponderables have been reviewed with the patient. Questions have been answered. All parties are agreeable.  Will schedule a surveillance colonoscopy (hx of polyps)  ASA-3 - propofol  Get denture when feasible       Notice: This dictation was prepared with Dragon dictation along with smaller phrase technology. Any transcriptional errors that result from this process are unintentional and may not be corrected upon review.

## 2020-09-06 ENCOUNTER — Telehealth: Payer: Self-pay

## 2020-09-06 NOTE — Telephone Encounter (Signed)
Received an approval letter from [facility]. Esomeprazole has been approved 10/30/20-10/29/21. Approval letter will be scanned in pts chart.

## 2020-09-08 ENCOUNTER — Telehealth: Payer: Self-pay

## 2020-09-08 NOTE — Telephone Encounter (Signed)
Called pt's wife, TCS w/Prop w/RMR ASA 3 scheduled for 09/14/20 at 8:30am. Wife said he can pickup instructions tomorrow. Orders entered.

## 2020-09-08 NOTE — Telephone Encounter (Signed)
Called and informed pt's wife of pre-op/covid test 09/10/20 at 10:15am. Appt letter and procedure instructions placed at front desk for him to pick up.

## 2020-09-08 NOTE — Patient Instructions (Signed)
KINDRICK LANKFORD  09/08/2020     @PREFPERIOPPHARMACY @   Your procedure is scheduled on  09/14/2020.  Report to [facility] at  0700  A.M.  Call this number if you have problems the morning of surgery:  [phone]   Remember:  Follow the diet and prep instructions given to you by the office.                       Take these medicines the morning of surgery with A SIP OF WATER   Amlodipine, nexium, atenolol, ativan(if needed), protonix, zoloft.    Do not wear jewelry, make-up or nail polish.  Do not wear lotions, powders, or perfumes. Please wear deodorant and brush your teeth.    Do not shave 48 hours prior to surgery.  Men may shave face and neck.  Do not bring valuables to the hospital.  [facility] is not responsible for any belongings or valuables.  Contacts, dentures or bridgework may not be worn into surgery.  Leave your suitcase in the car.  After surgery it may be brought to your room.  For patients admitted to the hospital, discharge time will be determined by your treatment team.  Patients discharged the day of surgery will not be allowed to drive home.   Name and phone number of your driver:   family Special instructions:  DO NOT smoke the morning of your procedure.  Please read over the following fact sheets that you were given. Anesthesia Post-op Instructions and Care and Recovery After Surgery       Upper Endoscopy, Adult, Care After This sheet gives you information about how to care for yourself after your procedure. Your health care provider may also give you more specific instructions. If you have problems or questions, contact your health care provider. What can I expect after the procedure? After the procedure, it is common to have:  A sore throat.  Mild stomach pain or discomfort.  Bloating.  Nausea. Follow these instructions at home:   Follow instructions from your health care provider about what to eat or drink after your  procedure.  Return to your normal activities as told by your health care provider. Ask your health care provider what activities are safe for you.  Take over-the-counter and prescription medicines only as told by your health care provider.  Do not drive for 24 hours if you were given a sedative during your procedure.  Keep all follow-up visits as told by your health care provider. This is important. Contact a health care provider if you have:  A sore throat that lasts longer than one day.  Trouble swallowing. Get help right away if:  You vomit blood or your vomit looks like coffee grounds.  You have: ? A fever. ? Bloody, black, or tarry stools. ? A severe sore throat or you cannot swallow. ? Difficulty breathing. ? Severe pain in your chest or abdomen. Summary  After the procedure, it is common to have a sore throat, mild stomach discomfort, bloating, and nausea.  Do not drive for 24 hours if you were given a sedative during the procedure.  Follow instructions from your health care provider about what to eat or drink after your procedure.  Return to your normal activities as told by your health care provider. This information is not intended to replace advice given to you by your health care provider. Make sure you discuss any questions  you have with your health care provider. Document Revised: 04/09/2018 Document Reviewed: 03/18/2018 Elsevier Patient Education  [place].  Colonoscopy, Adult, Care After This sheet gives you information about how to care for yourself after your procedure. Your health care provider may also give you more specific instructions. If you have problems or questions, contact your health care provider. What can I expect after the procedure? After the procedure, it is common to have:  A small amount of blood in your stool for 24 hours after the procedure.  Some gas.  Mild cramping or bloating of your abdomen. Follow these instructions  at home: Eating and drinking   Drink enough fluid to keep your urine pale yellow.  Follow instructions from your health care provider about eating or drinking restrictions.  Resume your normal diet as instructed by your health care provider. Avoid heavy or fried foods that are hard to digest. Activity  Rest as told by your health care provider.  Avoid sitting for a long time without moving. Get up to take short walks every 1-2 hours. This is important to improve blood flow and breathing. Ask for help if you feel weak or unsteady.  Return to your normal activities as told by your health care provider. Ask your health care provider what activities are safe for you. Managing cramping and bloating   Try walking around when you have cramps or feel bloated.  Apply heat to your abdomen as told by your health care provider. Use the heat source that your health care provider recommends, such as a moist heat pack or a heating pad. ? Place a towel between your skin and the heat source. ? Leave the heat on for 20-30 minutes. ? Remove the heat if your skin turns bright red. This is especially important if you are unable to feel pain, heat, or cold. You may have a greater risk of getting burned. General instructions  For the first 24 hours after the procedure: ? Do not drive or use machinery. ? Do not sign important documents. ? Do not drink alcohol. ? Do your regular daily activities at a slower pace than normal. ? Eat soft foods that are easy to digest.  Take over-the-counter and prescription medicines only as told by your health care provider.  Keep all follow-up visits as told by your health care provider. This is important. Contact a health care provider if:  You have blood in your stool 2-3 days after the procedure. Get help right away if you have:  More than a small spotting of blood in your stool.  Large blood clots in your stool.  Swelling of your abdomen.  Nausea or  vomiting.  A fever.  Increasing pain in your abdomen that is not relieved with medicine. Summary  After the procedure, it is common to have a small amount of blood in your stool. You may also have mild cramping and bloating of your abdomen.  For the first 24 hours after the procedure, do not drive or use machinery, sign important documents, or drink alcohol.  Get help right away if you have a lot of blood in your stool, nausea or vomiting, a fever, or increased pain in your abdomen. This information is not intended to replace advice given to you by your health care provider. Make sure you discuss any questions you have with your health care provider. Document Revised: 05/12/2019 Document Reviewed: 05/12/2019 Elsevier Patient Education  [place] After These instructions  provide you with information about caring for yourself after your procedure. Your health care provider may also give you more specific instructions. Your treatment has been planned according to current medical practices, but problems sometimes occur. Call your health care provider if you have any problems or questions after your procedure. What can I expect after the procedure? After your procedure, you may:  Feel sleepy for several hours.  Feel clumsy and have poor balance for several hours.  Feel forgetful about what happened after the procedure.  Have poor judgment for several hours.  Feel nauseous or vomit.  Have a sore throat if you had a breathing tube during the procedure. Follow these instructions at home: For at least 24 hours after the procedure:      Have a responsible adult stay with you. It is important to have someone help care for you until you are awake and alert.  Rest as needed.  Do not: ? Participate in activities in which you could fall or become injured. ? Drive. ? Use heavy machinery. ? Drink alcohol. ? Take sleeping pills or medicines that  cause drowsiness. ? Make important decisions or sign legal documents. ? Take care of children on your own. Eating and drinking  Follow the diet that is recommended by your health care provider.  If you vomit, drink water, juice, or soup when you can drink without vomiting.  Make sure you have little or no nausea before eating solid foods. General instructions  Take over-the-counter and prescription medicines only as told by your health care provider.  If you have sleep apnea, surgery and certain medicines can increase your risk for breathing problems. Follow instructions from your health care provider about wearing your sleep device: ? Anytime you are sleeping, including during daytime naps. ? While taking prescription pain medicines, sleeping medicines, or medicines that make you drowsy.  If you smoke, do not smoke without supervision.  Keep all follow-up visits as told by your health care provider. This is important. Contact a health care provider if:  You keep feeling nauseous or you keep vomiting.  You feel light-headed.  You develop a rash.  You have a fever. Get help right away if:  You have trouble breathing. Summary  For several hours after your procedure, you may feel sleepy and have poor judgment.  Have a responsible adult stay with you for at least 24 hours or until you are awake and alert. This information is not intended to replace advice given to you by your health care provider. Make sure you discuss any questions you have with your health care provider. Document Revised: 01/14/2018 Document Reviewed: 02/06/2016 Elsevier Patient Education  [place].

## 2020-09-10 ENCOUNTER — Encounter: Payer: Self-pay | Admitting: Internal Medicine

## 2020-09-10 ENCOUNTER — Other Ambulatory Visit: Payer: Self-pay

## 2020-09-10 ENCOUNTER — Other Ambulatory Visit (HOSPITAL_COMMUNITY)
Admission: RE | Admit: 2020-09-10 | Discharge: 2020-09-10 | Disposition: A | Discharge status: Home / Self Care | Payer: Medicare HMO | Source: Ambulatory Visit | Attending: Internal Medicine | Admitting: Internal Medicine

## 2020-09-10 ENCOUNTER — Encounter (HOSPITAL_COMMUNITY): Payer: Self-pay

## 2020-09-10 ENCOUNTER — Encounter (HOSPITAL_COMMUNITY)
Admission: RE | Admit: 2020-09-10 | Discharge: 2020-09-10 | Disposition: A | Discharge status: Home / Self Care | Payer: Medicare HMO | Source: Ambulatory Visit | Attending: Internal Medicine | Admitting: Internal Medicine

## 2020-09-10 DIAGNOSIS — Z20822 Contact with and (suspected) exposure to covid-19: Secondary | ICD-10-CM | POA: Diagnosis not present

## 2020-09-10 DIAGNOSIS — Z01818 Encounter for other preprocedural examination: Secondary | ICD-10-CM | POA: Diagnosis present

## 2020-09-10 DIAGNOSIS — I10 Essential (primary) hypertension: Secondary | ICD-10-CM | POA: Diagnosis not present

## 2020-09-10 HISTORY — DX: Unspecified osteoarthritis, unspecified site: M19.90

## 2020-09-10 LAB — BASIC METABOLIC PANEL
Anion gap: 9 (ref 5–15)
BUN: 17 mg/dL (ref 8–23)
CO2: 23 mmol/L (ref 22–32)
Calcium: 8.9 mg/dL (ref 8.9–10.3)
Chloride: 97 mmol/L — ABNORMAL LOW (ref 98–111)
Creatinine, Ser: 1.07 mg/dL (ref 0.61–1.24)
GFR, Estimated: >60 mL/min (ref >60)
Glucose, Bld: 123 mg/dL — ABNORMAL HIGH (ref 70–99)
Potassium: 4.1 mmol/L (ref 3.5–5.1)
Sodium: 129 mmol/L — ABNORMAL LOW (ref 135–145)

## 2020-09-10 LAB — CBC
HCT: 36.1 % — ABNORMAL LOW (ref 39.0–52.0)
Hemoglobin: 12.4 g/dL — ABNORMAL LOW (ref 13.0–17.0)
MCH: 31.2 pg (ref 26.0–34.0)
MCHC: 34.3 g/dL (ref 30.0–36.0)
MCV: 90.9 fL (ref 80.0–100.0)
Platelets: 230 10*3/uL (ref 150–400)
RBC: 3.97 MIL/uL — ABNORMAL LOW (ref 4.22–5.81)
RDW: 12.7 % (ref 11.5–15.5)
WBC: 7.7 10*3/uL (ref 4.0–10.5)
nRBC: 0 % (ref 0.0–0.2)

## 2020-09-10 LAB — SARS CORONAVIRUS 2 (TAT 6-24 HRS): SARS Coronavirus 2: NEGATIVE

## 2020-09-14 ENCOUNTER — Encounter (HOSPITAL_COMMUNITY)
Admission: RE | Disposition: A | Discharge status: Home / Self Care | Payer: Self-pay | Source: Home / Self Care | Attending: Internal Medicine

## 2020-09-14 ENCOUNTER — Ambulatory Visit (HOSPITAL_COMMUNITY): Payer: Medicare HMO | Admitting: Anesthesiology

## 2020-09-14 ENCOUNTER — Ambulatory Visit (HOSPITAL_COMMUNITY)
Admission: RE | Admit: 2020-09-14 | Discharge: 2020-09-14 | Disposition: A | Discharge status: Home / Self Care | Payer: Medicare HMO | Attending: Internal Medicine | Admitting: Internal Medicine

## 2020-09-14 DIAGNOSIS — K219 Gastro-esophageal reflux disease without esophagitis: Secondary | ICD-10-CM | POA: Insufficient documentation

## 2020-09-14 DIAGNOSIS — K5730 Diverticulosis of large intestine without perforation or abscess without bleeding: Secondary | ICD-10-CM | POA: Insufficient documentation

## 2020-09-14 DIAGNOSIS — K5900 Constipation, unspecified: Secondary | ICD-10-CM | POA: Diagnosis not present

## 2020-09-14 DIAGNOSIS — F17210 Nicotine dependence, cigarettes, uncomplicated: Secondary | ICD-10-CM | POA: Diagnosis not present

## 2020-09-14 DIAGNOSIS — Z7982 Long term (current) use of aspirin: Secondary | ICD-10-CM | POA: Insufficient documentation

## 2020-09-14 DIAGNOSIS — Z1211 Encounter for screening for malignant neoplasm of colon: Secondary | ICD-10-CM | POA: Insufficient documentation

## 2020-09-14 DIAGNOSIS — Z86010 Personal history of colonic polyps: Secondary | ICD-10-CM | POA: Diagnosis not present

## 2020-09-14 DIAGNOSIS — Z8719 Personal history of other diseases of the digestive system: Secondary | ICD-10-CM | POA: Diagnosis not present

## 2020-09-14 DIAGNOSIS — Z79899 Other long term (current) drug therapy: Secondary | ICD-10-CM | POA: Diagnosis not present

## 2020-09-14 HISTORY — PX: COLONOSCOPY WITH PROPOFOL: SHX5780

## 2020-09-14 SURGERY — COLONOSCOPY WITH PROPOFOL
Anesthesia: General

## 2020-09-14 MED ORDER — LACTATED RINGERS IV SOLN: INTRAVENOUS | Status: DC | PRN

## 2020-09-14 MED ORDER — LACTATED RINGERS IV SOLN: Freq: Once | INTRAVENOUS | Status: AC

## 2020-09-14 MED ORDER — PROPOFOL 10 MG/ML IV BOLUS
INTRAVENOUS | Status: DC | PRN
  Administered 2020-09-14: 125 ug/kg/min via INTRAVENOUS
  Administered 2020-09-14: 100 mg via INTRAVENOUS

## 2020-09-14 NOTE — Interval H&P Note (Signed)
History and Physical Interval Note:  09/14/2020 9:13 AM  Trevor Berg  has presented today for surgery, with the diagnosis of history of polyps.  The various methods of treatment have been discussed with the patient and family. After consideration of risks, benefits and other options for treatment, the patient has consented to  Procedure(s) with comments: COLONOSCOPY WITH PROPOFOL (N/A) - 8:30am as a surgical intervention.  The patient's history has been reviewed, patient examined, no change in status, stable for surgery.  I have reviewed the patient's chart and labs.  Questions were answered to the patient's satisfaction.     Trevor Berg  No change.  Surveillance colonoscopy per plan. The risks, benefits, limitations, alternatives and imponderables have been reviewed with the patient. Questions have been answered. All parties are agreeable.

## 2020-09-14 NOTE — Transfer of Care (Signed)
Immediate Anesthesia Transfer of Care Note  Patient: Trevor Berg  Procedure(s) Performed: COLONOSCOPY WITH PROPOFOL (N/A )  Patient Location: PACU  Anesthesia Type:General  Level of Consciousness: awake, alert , oriented and patient cooperative  Airway & Oxygen Therapy: Patient Spontanous Breathing  Post-op Assessment: Report given to RN, Post -op Vital signs reviewed and stable and Patient moving all extremities  Post vital signs: Reviewed and stable  Last Vitals:  Vitals Value Taken Time  BP    Temp    Pulse    Resp    SpO2      Last Pain:  Vitals:   09/14/20 0915  TempSrc:   PainSc: 9       Patients Stated Pain Goal: 7 (88/32/54 [id number])  Complications: No complications documented.

## 2020-09-14 NOTE — Anesthesia Preprocedure Evaluation (Signed)
Anesthesia Evaluation  Patient identified by MRN, date of birth, ID band Patient awake    Reviewed: Allergy & Precautions, NPO status , Patient's Chart, lab work & pertinent test results, reviewed documented beta blocker date and time   Airway Mallampati: II  TM Distance: >3 FB Neck ROM: Full    Dental  (+) Edentulous Upper, Edentulous Lower   Pulmonary asthma , pneumonia, COPD,  COPD inhaler, Current SmokerPatient did not abstain from smoking.,    Pulmonary exam normal breath sounds clear to auscultation       Cardiovascular Exercise Tolerance: Poor hypertension, Pt. on medications and Pt. on home beta blockers + angina (took NTG last night) with exertion Normal cardiovascular exam Rhythm:Regular Rate:Normal  10-Sep-2020 10:35:13 [facility]-AP-300 ROUTINE RECORD Normal sinus rhythm Normal ECG   Neuro/Psych negative neurological ROS  negative psych ROS   GI/Hepatic Neg liver ROS, GERD  Medicated,  Endo/Other  negative endocrine ROS  Renal/GU negative Renal ROS     Musculoskeletal  (+) Arthritis ,   Abdominal   Peds  Hematology negative hematology ROS (+)   Anesthesia Other Findings   Reproductive/Obstetrics                            Anesthesia Physical Anesthesia Plan  ASA: III  Anesthesia Plan: General   Post-op Pain Management:    Induction: Intravenous  PONV Risk Score and Plan: TIVA  Airway Management Planned: Nasal Cannula and Natural Airway  Additional Equipment:   Intra-op Plan:   Post-operative Plan:   Informed Consent: I have reviewed the patients History and Physical, chart, labs and discussed the procedure including the risks, benefits and alternatives for the proposed anesthesia with the patient or authorized representative who has indicated his/her understanding and acceptance.       Plan Discussed with: CRNA and Surgeon  Anesthesia Plan  Comments:         Anesthesia Quick Evaluation

## 2020-09-14 NOTE — Interval H&P Note (Signed)
History and Physical Interval Note:  09/14/2020 9:09 AM  Trevor Berg  has presented today for surgery, with the diagnosis of history of polyps.  The various methods of treatment have been discussed with the patient and family. After consideration of risks, benefits and other options for treatment, the patient has consented to  Procedure(s) with comments: COLONOSCOPY WITH PROPOFOL (N/A) - 8:30am as a surgical intervention.  The patient's history has been reviewed, patient examined, no change in status, stable for surgery.  I have reviewed the patient's chart and labs.  Questions were answered to the patient's satisfaction.     Mimie Goering  No change.  Surveillance colonoscopy today per plan.  The risks, benefits, limitations, alternatives and imponderables have been reviewed with the patient. Questions have been answered. All parties are agreeable.

## 2020-09-14 NOTE — Op Note (Signed)
[facility] Patient Name: Trevor Berg Procedure Date: 09/14/2020 8:32 AM MRN: [id number] Date of Birth: June 21, 1951 Attending MD: [name] , MD CSN: [id number] Age: 69 Admit Type: Outpatient Procedure:                Colonoscopy Indications:              High risk colon cancer surveillance: Personal                            history of colonic polyps Providers:                [name], MD, Otis Peak B. Sharon Seller, RN,                            Lambert Mody, Randa Spike, Technician Referring MD:              Medicines:                Propofol per Anesthesia Complications:            No immediate complications. Estimated Blood Loss:     Estimated blood loss: none. Estimated blood loss:                            none. Procedure:                Pre-Anesthesia Assessment:                           - Prior to the procedure, a History and Physical                            was performed, and patient medications and                            allergies were reviewed. The patient's tolerance of                            previous anesthesia was also reviewed. The risks                            and benefits of the procedure and the sedation                            options and risks were discussed with the patient.                            All questions were answered, and informed consent                            was obtained. Prior Anticoagulants: The patient has                            taken no previous anticoagulant or antiplatelet  agents. ASA Grade Assessment: III - A patient with                            severe systemic disease. After reviewing the risks                            and benefits, the patient was deemed in                            satisfactory condition to undergo the procedure.                           After obtaining informed consent, the colonoscope                            was passed under  direct vision. Throughout the                            procedure, the patient's blood pressure, pulse, and                            oxygen saturations were monitored continuously. The                            CF-[id number] ([id number]) scope was introduced through                            the anus and advanced to the the cecum, identified                            by appendiceal orifice and ileocecal valve. The                            colonoscopy was performed without difficulty. The                            patient tolerated the procedure well. The quality                            of the bowel preparation was adequate. Scope In: 9:18:30 AM Scope Out: 9:33:35 AM Scope Withdrawal Time: 0 hours 10 minutes 16 seconds  Total Procedure Duration: 0 hours 15 minutes 5 seconds  Findings:      The perianal and digital rectal examinations were normal.      Small-mouthed diverticula were found in the entire colon.      The exam was otherwise without abnormality on direct and retroflexion       views. Impression:               - Diverticulosis in the entire examined colon.                           - The examination was otherwise normal on direct  and retroflexion views.                           - No specimens collected. Moderate Sedation:      Moderate (conscious) sedation was personally administered by an       anesthesia professional. The following parameters were monitored: oxygen       saturation, heart rate, blood pressure, respiratory rate, EKG, adequacy       of pulmonary ventilation, and response to care. Recommendation:           - Patient has a contact number available for                            emergencies. The signs and symptoms of potential                            delayed complications were discussed with the                            patient. Return to normal activities tomorrow.                            Written discharge instructions  were provided to the                            patient.                           - Resume previous diet.                           - Continue present medications.                           - Repeat colonoscopy in 7 years for surveillance.                           - Return to GI office in 6 months. Procedure Code(s):        --- Professional ---                           [phone], Colonoscopy, flexible; diagnostic, including                            collection of specimen(s) by brushing or washing,                            when performed (separate procedure) Diagnosis Code(s):        --- Professional ---                           Z86.010, Personal history of colonic polyps                           K57.30, Diverticulosis of large intestine without  perforation or abscess without bleeding CPT copyright 2019 [employer]. All rights reserved. The codes documented in this report are preliminary and upon coder review may  be revised to meet current compliance requirements. Trevor Berg. Trevor Ewalt, MD [name], MD 09/14/2020 9:45:16 AM This report has been signed electronically. Number of Addenda: 0

## 2020-09-14 NOTE — Discharge Instructions (Signed)
PATIENT INSTRUCTIONS POST-ANESTHESIA  IMMEDIATELY FOLLOWING SURGERY:  Do not drive or operate machinery for the first twenty four hours after surgery.  Do not make any important decisions for twenty four hours after surgery or while taking narcotic pain medications or sedatives.  If you develop intractable nausea and vomiting or a severe headache please notify your doctor immediately.  FOLLOW-UP:  Please make an appointment with your surgeon as instructed. You do not need to follow up with anesthesia unless specifically instructed to do so.  WOUND CARE INSTRUCTIONS (if applicable):  Keep a dry clean dressing on the anesthesia/puncture wound site if there is drainage.  Once the wound has quit draining you may leave it open to air.  Generally you should leave the bandage intact for twenty four hours unless there is drainage.  If the epidural site drains for more than 36-48 hours please call the anesthesia department.  QUESTIONS?:  Please feel free to call your physician or the hospital operator if you have any questions, and they will be happy to assist you.       Colonoscopy Discharge Instructions  Read the instructions outlined below and refer to this sheet in the next few weeks. These discharge instructions provide you with general information on caring for yourself after you leave the hospital. Your doctor may also give you specific instructions. While your treatment has been planned according to the most current medical practices available, unavoidable complications occasionally occur. If you have any problems or questions after discharge, call Dr. Gala Romney at [phone]. ACTIVITY  You may resume your regular activity, but move at a slower pace for the next 24 hours.   Take frequent rest periods for the next 24 hours.   Walking will help get rid of the air and reduce the bloated feeling in your belly (abdomen).   No driving for 24 hours (because of the medicine (anesthesia) used during the  test).    Do not sign any important legal documents or operate any machinery for 24 hours (because of the anesthesia used during the test).  NUTRITION  Drink plenty of fluids.   You may resume your normal diet as instructed by your doctor.   Begin with a light meal and progress to your normal diet. Heavy or fried foods are harder to digest and may make you feel sick to your stomach (nauseated).   Avoid alcoholic beverages for 24 hours or as instructed.  MEDICATIONS  You may resume your normal medications unless your doctor tells you otherwise.  WHAT YOU CAN EXPECT TODAY  Some feelings of bloating in the abdomen.   Passage of more gas than usual.   Spotting of blood in your stool or on the toilet paper.  IF YOU HAD POLYPS REMOVED DURING THE COLONOSCOPY:  No aspirin products for 7 days or as instructed.   No alcohol for 7 days or as instructed.   Eat a soft diet for the next 24 hours.  FINDING OUT THE RESULTS OF YOUR TEST Not all test results are available during your visit. If your test results are not back during the visit, make an appointment with your caregiver to find out the results. Do not assume everything is normal if you have not heard from your caregiver or the medical facility. It is important for you to follow up on all of your test results.  SEEK IMMEDIATE MEDICAL ATTENTION IF:  You have more than a spotting of blood in your stool.   Your belly is swollen (  abdominal distention).   You are nauseated or vomiting.   You have a temperature over 101.   You have abdominal pain or discomfort that is severe or gets worse throughout the day.   No polyps found today  Recommend 1 more colonoscopy in 7 years if overall health permits  Office visit with [place] in 6 months  At patient request, I called Benedetto Goad at [phone] message with results

## 2020-09-14 NOTE — Anesthesia Postprocedure Evaluation (Signed)
Anesthesia Post Note  Patient: Trevor Berg  Procedure(s) Performed: COLONOSCOPY WITH PROPOFOL (N/A )  Patient location during evaluation: PACU Anesthesia Type: General Level of consciousness: oriented, awake, awake and alert and patient cooperative Pain management: pain level controlled Vital Signs Assessment: post-procedure vital signs reviewed and stable Respiratory status: spontaneous breathing, respiratory function stable and nonlabored ventilation Cardiovascular status: blood pressure returned to baseline and stable Postop Assessment: no headache and no backache Anesthetic complications: no   No complications documented.   Last Vitals:  Vitals:   09/14/20 0800  BP: (!) 143/81  Pulse: 65  Resp: 14  Temp: 36.9 C  SpO2: 100%    Last Pain:  Vitals:   09/14/20 0915  TempSrc:   PainSc: [place]

## 2020-09-20 ENCOUNTER — Encounter (HOSPITAL_COMMUNITY): Payer: Self-pay | Admitting: Internal Medicine

## 2020-10-10 ENCOUNTER — Emergency Department (HOSPITAL_COMMUNITY)
Admission: EM | Admit: 2020-10-10 | Discharge: 2020-10-10 | Disposition: A | Discharge status: Home / Self Care | Payer: Medicare HMO | Attending: Emergency Medicine | Admitting: Emergency Medicine

## 2020-10-10 ENCOUNTER — Encounter (HOSPITAL_COMMUNITY): Payer: Self-pay

## 2020-10-10 ENCOUNTER — Emergency Department (HOSPITAL_COMMUNITY): Payer: Medicare HMO

## 2020-10-10 ENCOUNTER — Other Ambulatory Visit: Payer: Self-pay

## 2020-10-10 DIAGNOSIS — F17210 Nicotine dependence, cigarettes, uncomplicated: Secondary | ICD-10-CM | POA: Insufficient documentation

## 2020-10-10 DIAGNOSIS — I10 Essential (primary) hypertension: Secondary | ICD-10-CM | POA: Diagnosis not present

## 2020-10-10 DIAGNOSIS — J441 Chronic obstructive pulmonary disease with (acute) exacerbation: Secondary | ICD-10-CM | POA: Diagnosis not present

## 2020-10-10 DIAGNOSIS — Z86010 Personal history of colonic polyps: Secondary | ICD-10-CM | POA: Insufficient documentation

## 2020-10-10 DIAGNOSIS — R079 Chest pain, unspecified: Secondary | ICD-10-CM

## 2020-10-10 DIAGNOSIS — Z79899 Other long term (current) drug therapy: Secondary | ICD-10-CM | POA: Diagnosis not present

## 2020-10-10 DIAGNOSIS — R0789 Other chest pain: Secondary | ICD-10-CM

## 2020-10-10 DIAGNOSIS — Z7982 Long term (current) use of aspirin: Secondary | ICD-10-CM | POA: Diagnosis not present

## 2020-10-10 LAB — BASIC METABOLIC PANEL
Anion gap: 9 (ref 5–15)
BUN: 14 mg/dL (ref 8–23)
CO2: 25 mmol/L (ref 22–32)
Calcium: 9.3 mg/dL (ref 8.9–10.3)
Chloride: 91 mmol/L — ABNORMAL LOW (ref 98–111)
Creatinine, Ser: 0.93 mg/dL (ref 0.61–1.24)
GFR, Estimated: >60 mL/min (ref >60)
Glucose, Bld: 113 mg/dL — ABNORMAL HIGH (ref 70–99)
Potassium: 2.9 mmol/L — ABNORMAL LOW (ref 3.5–5.1)
Sodium: 125 mmol/L — ABNORMAL LOW (ref 135–145)

## 2020-10-10 LAB — TROPONIN I (HIGH SENSITIVITY)
Troponin I (High Sensitivity): 4 ng/L (ref <18)
Troponin I (High Sensitivity): 5 ng/L (ref <18)

## 2020-10-10 LAB — CBC
HCT: 35.3 % — ABNORMAL LOW (ref 39.0–52.0)
Hemoglobin: 12.4 g/dL — ABNORMAL LOW (ref 13.0–17.0)
MCH: 31.7 pg (ref 26.0–34.0)
MCHC: 35.1 g/dL (ref 30.0–36.0)
MCV: 90.3 fL (ref 80.0–100.0)
Platelets: 245 10*3/uL (ref 150–400)
RBC: 3.91 MIL/uL — ABNORMAL LOW (ref 4.22–5.81)
RDW: 12.5 % (ref 11.5–15.5)
WBC: 8.7 10*3/uL (ref 4.0–10.5)
nRBC: 0 % (ref 0.0–0.2)

## 2020-10-10 MED ORDER — PREDNISONE 10 MG PO TABS: 20.0000 mg | ORAL_TABLET | Freq: Two times a day (BID) | ORAL | 0 refills | Status: DC

## 2020-10-10 MED ORDER — HYDROMORPHONE HCL 1 MG/ML IJ SOLN
1.0000 mg | Freq: Once | INTRAMUSCULAR | Status: AC
Start: 2020-10-10 — End: 2020-10-10
  Administered 2020-10-10: 1 mg via INTRAVENOUS
  Filled 2020-10-10 (×2): qty 1

## 2020-10-10 MED ORDER — MORPHINE SULFATE (PF) 4 MG/ML IV SOLN
4.0000 mg | Freq: Once | INTRAVENOUS | Status: AC
  Administered 2020-10-10: 4 mg via INTRAVENOUS
  Filled 2020-10-10: qty 1

## 2020-10-10 MED ORDER — KETOROLAC TROMETHAMINE 30 MG/ML IJ SOLN
30.0000 mg | Freq: Once | INTRAMUSCULAR | Status: AC
  Administered 2020-10-10: 30 mg via INTRAVENOUS
  Filled 2020-10-10: qty 1

## 2020-10-10 MED ORDER — OXYCODONE-ACETAMINOPHEN 5-325 MG PO TABS: 1.0000 | ORAL_TABLET | Freq: Four times a day (QID) | ORAL | 0 refills | Status: DC | PRN

## 2020-10-10 MED ORDER — IOHEXOL 350 MG/ML SOLN
100.0000 mL | Freq: Once | INTRAVENOUS | Status: AC | PRN
  Administered 2020-10-10: 100 mL via INTRAVENOUS

## 2020-10-10 NOTE — ED Provider Notes (Signed)
[facility] EMERGENCY DEPARTMENT Provider Note   CSN: [id number] Arrival date & time: 10/10/20  0240     History Chief Complaint  Patient presents with  . Chest Pain    Trevor Berg is a 69 y.o. male.  Patient is a 69 year old male with history of COPD, hypertension, hyperlipidemia.  He presents today for evaluation of chest discomfort.  This started at approximately 8 PM while he was traveling in his vehicle to view the Christmas lights at [place].  It became worse through the night.  At 2 AM, he woke his wife and told her it was hurting worse.  He then came here to be evaluated.  He denies shortness of breath, nausea, or diaphoresis.  He denies any radiation of his pain to his arm or jaw.  He describes the discomfort as something "twisting "inside of his chest.  Patient has no prior cardiac history, but does appear to have had a stress echo in 2013 which was unremarkable.  The history is provided by the patient.       Past Medical History:  Diagnosis Date  . Anginal pain ([place])   . Anxiety   . Arthritis    both hands  . Asthma   . Chest pain 2005   noncritical coronary disease in 2005: 40% distal RCA, 30% CX, 50% OM 2, EF-60%,  . COPD (chronic obstructive pulmonary disease) (HCC)    Chronic bronchitis; 100-pack-year cigarette consumption  . Gastroesophageal reflux disease    possible small Schatzki's ring; esophageal dilatation in 2005  . H. pylori infection 2014   treated with prevpac  . Hyperlipidemia   . Hypertension   . Overweight(278.02)   . Tobacco abuse    100 pack years    Patient Active Problem List   Diagnosis Date Noted  . Gastroesophageal reflux disease 07/23/2019  . S/P right knee arthroscopy 12/06/17 12/13/2017  . Derang of medial meniscus due to old tear/inj, right knee   . Loose body in knee, right knee   . Mucosal abnormality of stomach   . Dysphagia   . History of colonic polyps   . Diverticulosis of colon without hemorrhage   . Essential  hypertension 02/23/2014  . COPD (chronic obstructive pulmonary disease) ([place]) 11/07/2013  . COPD exacerbation ([place]) 11/06/2013  . Pneumonia and influenza 11/06/2013  . Cough with sputum 05/07/2013  . Esophageal dysphagia 02/25/2013  . Chronic constipation 02/25/2013  . Abdominal pain, chronic, epigastric 02/25/2013  . Chest pain   . Tobacco abuse     Past Surgical History:  Procedure Laterality Date  . CERVICAL SPINE SURGERY     C5-6 and C6-7: Relief of spinal stenosis with corpectomy, foraminotomy and fusion  . COLONOSCOPY  10/19/2004   RMR:   Friable anal canal, likely source of patient's hematochezia/  Otherwise normal rectum, normal colon.  . COLONOSCOPY N/A 09/09/2015   RMR: Pancolonic diverticulosis. Multiple rectal polyps and 1 polyp in the ascending colon. Pathology with hyperplastic rectal polyps and tubular adenoma colon polyp. Repeat in 2021.  Marland Kitchen COLONOSCOPY WITH PROPOFOL N/A 09/14/2020   Procedure: COLONOSCOPY WITH PROPOFOL;  Surgeon: Daneil Dolin, MD;  Location: [facility];  Service: Endoscopy;  Laterality: N/A;  8:30am  . ESOPHAGEAL DILATION N/A 09/09/2015   Procedure: ESOPHAGEAL DILATION;  Surgeon: Daneil Dolin, MD;  Location: [facility];  Service: Endoscopy;  Laterality: N/A;  . ESOPHAGOGASTRODUODENOSCOPY  09/27/2004   RMR:   A couple of tiny, distal esophageal erosions, otherwise normal esophagus  aside from a Schatzki ring status post Maloney dilation as described above/ Small hiatal hernia, otherwise normal stomach, normal D1-D2  . ESOPHAGOGASTRODUODENOSCOPY N/A 09/09/2015   Procedure: ESOPHAGOGASTRODUODENOSCOPY (EGD);  Surgeon: Daneil Dolin, MD;  [facility];  Service: Endoscopy;  Laterality: N/A;  . ESOPHAGOGASTRODUODENOSCOPY N/A 04/19/2017   RMR: Normal esophagus. Dilated. Erosive gastropathy. Biopsied. Otherwise normal. Pathology with mild chronic gastritis. No H. pylori  . ESOPHAGOGASTRODUODENOSCOPY (EGD) WITH ESOPHAGEAL DILATION N/A  03/19/2013   VQX:IHWTUU esophagus s/p dilation/Abnormal stomach of uncertain clinical significance-s/p bx positive for H. pylori, status post Prevpac treatment  . ESOPHAGOGASTRODUODENOSCOPY (EGD) WITH PROPOFOL N/A 10/02/2019   Procedure: ESOPHAGOGASTRODUODENOSCOPY (EGD) WITH PROPOFOL;  Surgeon: Daneil Dolin, MD;  [facility];  Service: Endoscopy;  Laterality: N/A;  8:30am  . KNEE ARTHROSCOPY WITH MEDIAL MENISECTOMY Right 12/06/2017   Procedure: RIGHT KNEE ARTHROSCOPY WITH PARTIAL MEDIAL MENISECTOMY; REMOVAL OF LOOSE BODY;  Surgeon: Carole Civil, MD;  Location: [facility] ORS;  Service: Orthopedics;  Laterality: Right;  . MALONEY DILATION N/A 04/19/2017   Procedure: Venia Minks DILATION;  Surgeon: Daneil Dolin, MD;  [facility];  Service: Endoscopy;  Laterality: N/A;  . Venia Minks DILATION N/A 10/02/2019   Procedure: Venia Minks DILATION;  Surgeon: Daneil Dolin, MD;  [facility];  Service: Endoscopy;  Laterality: N/A;  . ORIF ANKLE FRACTURE     right  . ORIF TIBIA FRACTURE     Left  . TONSILLECTOMY         Family History  Problem Relation Age of Onset  . Heart attack Mother        deceased, age 69s  . Ulcers Brother   . Diabetes Brother   . Hypertension Other   . Colon cancer Neg Hx     Social History   Tobacco Use  . Smoking status: Current Every Day Smoker    Packs/day: 1.50    Years: 50.00    Pack years: 75.00    Types: Cigarettes  . Smokeless tobacco: Never Used  . Tobacco comment: 1 pack per day as of 08/2012  Vaping Use  . Vaping Use: Never used  Substance Use Topics  . Alcohol use: No    Alcohol/week: 0.0 standard drinks    Comment: occasionally, usually holidays but sometimes on weekends, prior heavy use  . Drug use: No    Home Medications Prior to Admission medications   Medication Sig Start Date End Date Taking? Authorizing Provider  albuterol (PROVENTIL HFA;VENTOLIN HFA) 108 (90 BASE) MCG/ACT inhaler Inhale 2 puffs into the lungs  every 6 (six) hours as needed for shortness of breath.    Yes [provider]  [name] 24 MCG capsule TAKE 1 CAPSULE (24 MCG TOTAL) BY MOUTH 2 (TWO) TIMES DAILY WITH A MEAL. 07/19/20  Yes Annitta Needs, NP  amLODipine (NORVASC) 2.5 MG tablet Take 2.5 mg by mouth daily.   Yes [provider]  ANORO ELLIPTA 62.5-25 MCG/INH AEPB Inhale 1 puff into the lungs daily.  03/08/15  Yes [provider]  aspirin EC 81 MG tablet Take 81 mg by mouth daily.   Yes [provider]  atenolol (TENORMIN) 50 MG tablet TAKE ONE TABLET BY MOUTH ONCE DAILY IN THE MORNING 07/27/16  Yes Branch, Alphonse Guild, MD  dextromethorphan-guaiFENesin [facility] DM) 30-600 MG 12hr tablet Take 1 tablet by mouth 2 (two) times daily.   Yes [provider]  esomeprazole (NEXIUM) 40 MG capsule Take 1 capsule (40 mg total)  by mouth 2 (two) times daily before a meal. 08/31/20  Yes Rourk, [name], MD  fluticasone [facility]) 50 MCG/ACT nasal spray Place 2 sprays into both nostrils daily.   Yes [provider]  hydrochlorothiazide (MICROZIDE) 12.5 MG capsule TAKE 1 CAPSULE BY MOUTH ONCE A DAY FOR HTN/SWELLING 06/29/17  Yes [provider]  lisinopril (PRINIVIL,ZESTRIL) 40 MG tablet Take 1 tablet (40 mg total) by mouth daily. 11/06/16  Yes Branch, Alphonse Guild, MD  LORazepam (ATIVAN) 1 MG tablet Take 1 mg by mouth 3 (three) times daily as needed for anxiety.    Yes [provider]  Naphazoline HCl (CLEAR EYES OP) Apply 1 drop to eye daily as needed (dry eyes).   Yes [provider]  nitroGLYCERIN (NITROSTAT) 0.4 MG SL tablet Place 0.4 mg under the tongue every 5 (five) minutes as needed for chest pain. Chest Pain   Yes [provider]  pantoprazole (PROTONIX) 40 MG tablet Take 40 mg by mouth daily. 07/31/20  Yes [provider]  polyethylene glycol powder (GLYCOLAX/MIRALAX) powder USE 1 CAPFUL DAILY AS NEEDED FOR CONSTIPATION Patient taking differently: USE 1  CAPFUL DAILY 11/08/16  Yes Annitta Needs, NP  sertraline (ZOLOFT) 50 MG tablet Take 50 mg by mouth daily.   Yes [provider]  simvastatin (ZOCOR) 20 MG tablet Take 20 mg by mouth daily.   Yes [provider]  traZODone (DESYREL) 50 MG tablet Take 50 mg by mouth at bedtime. 04/09/17  Yes [provider]  vitamin B-12 (CYANOCOBALAMIN) 1000 MCG tablet Take 1,000 mcg by mouth daily.    Yes [provider]  zinc gluconate 50 MG tablet Take 50 mg by mouth daily.   Yes [provider]  Ergocalciferol (VITAMIN D2) 50 MCG (2000 UT) TABS Take 1 tablet by mouth daily.    [provider]    Allergies    Chantix [varenicline] and Tomato  Review of Systems   Review of Systems  All other systems reviewed and are negative.   Physical Exam Updated Vital Signs BP (!) 143/82 (BP Location: Left Arm)   Pulse 77   Temp 98.3 F (36.8 C) (Oral)   Resp 17   Ht 5\' 7"  (1.702 m)   Wt 81.6 kg   SpO2 100%   BMI 28.19 kg/m   Physical Exam Vitals and nursing note reviewed.  Constitutional:      General: He is not in acute distress.    Appearance: He is well-developed and well-nourished. He is not diaphoretic.  HENT:     Head: Normocephalic and atraumatic.     Mouth/Throat:     Mouth: Oropharynx is clear and moist.  Cardiovascular:     Rate and Rhythm: Normal rate and regular rhythm.     Heart sounds: No murmur heard. No friction rub.  Pulmonary:     Effort: Pulmonary effort is normal. No respiratory distress.     Breath sounds: Normal breath sounds. No wheezing or rales.  Abdominal:     General: Bowel sounds are normal. There is no distension.     Palpations: Abdomen is soft.     Tenderness: There is no abdominal tenderness.  Musculoskeletal:        General: No edema. Normal range of motion.     Cervical back: Normal range of motion and neck supple.     Right lower leg: No tenderness. No edema.     Left lower leg: No tenderness. No edema.   Skin:  General: Skin is warm and dry.  Neurological:     Mental Status: He is alert and oriented to person, place, and time.     Coordination: Coordination normal.     ED Results / Procedures / Treatments   Labs (all labs ordered are listed, but only abnormal results are displayed) Labs Reviewed - No data to display  EKG ED ECG REPORT   Date: 10/10/2020  Rate: 77  Rhythm: normal sinus rhythm  QRS Axis: left  Intervals: normal  ST/T Wave abnormalities: normal  Conduction Disutrbances:none  Narrative Interpretation:   Old EKG Reviewed: none available  I have personally reviewed the EKG tracing and agree with the computerized printout as noted.      Radiology No results found.  Procedures Procedures (including critical care time)  Medications Ordered in ED Medications - No data to display  ED Course  I have reviewed the triage vital signs and the nursing notes.  Pertinent labs & imaging results that were available during my care of the patient were reviewed by me and considered in my medical decision making (see chart for details).    MDM Rules/Calculators/A&P  Patient is a 69 year old male presenting with complaints of chest discomfort.  His pain is sharp in nature and worse when he lies in certain positions.  His vitals are stable with no hypoxia.  There is nothing in the work-up that indicates a cardiac etiology.  His EKG is unchanged and troponin x2 is negative.  I have also obtained a CTA of the chest to rule out pulmonary embolism.  The study was negative for PE and also shows no evidence for dissection or other acute intrathoracic pathology.  Patient has received pain medicine with some improvement of his discomfort.  I highly suspect his pain is musculoskeletal in nature given the fact that it is worse in certain positions and the fact that his work-up is unremarkable.  At this point, I feel as though discharge is appropriate.  He has had issues with his  neck and back in the past I suspect this to be the cause.  He will be discharged with prednisone and pain medicine.  He is to follow-up with his primary doctor if not improving in the next few days and return to the ER if he worsens.  Final Clinical Impression(s) / ED Diagnoses Final diagnoses:  None    Rx / DC Orders ED Discharge Orders    None       Veryl Speak, MD 10/10/20 [phone]

## 2020-10-10 NOTE — ED Notes (Signed)
ED Provider at bedside. 

## 2020-10-10 NOTE — ED Notes (Signed)
Patient transported to CT 

## 2020-10-10 NOTE — ED Triage Notes (Signed)
Pt arrives from home via POV c/o chest pain on the left side which began last evening and has been worsening. Pt reports taking 4 NTG tab PTA without relief.

## 2020-10-10 NOTE — Discharge Instructions (Signed)
Begin taking prednisone as prescribed.  Take Percocet as prescribed as needed for pain.  Follow-up with primary doctor if symptoms or not improving in the next few days, and return to the ER if symptoms significantly worsen or change.

## 2020-11-01 ENCOUNTER — Ambulatory Visit: Payer: Medicare HMO

## 2020-11-01 ENCOUNTER — Ambulatory Visit: Payer: Medicare HMO | Admitting: Orthopedic Surgery

## 2020-11-01 ENCOUNTER — Other Ambulatory Visit: Payer: Self-pay

## 2020-11-01 ENCOUNTER — Encounter: Payer: Self-pay | Admitting: Orthopedic Surgery

## 2020-11-01 VITALS — BP 124/75 | HR 78 | Ht 67.0 in | Wt 174.0 lb

## 2020-11-01 DIAGNOSIS — M25561 Pain in right knee: Secondary | ICD-10-CM | POA: Diagnosis not present

## 2020-11-01 DIAGNOSIS — G992 Myelopathy in diseases classified elsewhere: Secondary | ICD-10-CM

## 2020-11-01 DIAGNOSIS — M4802 Spinal stenosis, cervical region: Secondary | ICD-10-CM | POA: Diagnosis not present

## 2020-11-01 DIAGNOSIS — G8929 Other chronic pain: Secondary | ICD-10-CM | POA: Diagnosis not present

## 2020-11-01 DIAGNOSIS — M25562 Pain in left knee: Secondary | ICD-10-CM

## 2020-11-01 DIAGNOSIS — M542 Cervicalgia: Secondary | ICD-10-CM

## 2020-11-01 DIAGNOSIS — R269 Unspecified abnormalities of gait and mobility: Secondary | ICD-10-CM | POA: Diagnosis not present

## 2020-11-01 NOTE — Progress Notes (Signed)
Patient ID: Trevor Berg, male   DOB: [birth year], 70 y.o.   MRN: [id number]  Chief Complaint  Patient presents with  . Knee Pain    Bilateral knees giving away sore on R knee from fall 2 weeks ago   ASSESSMENT AND PLAN:   Encounter Diagnoses  Name Primary?  . Chronic pain of right knee   . Chronic pain of left knee   . Gait disturbance   . Neck pain   . Stenosis of cervical spine with myelopathy [facility]) Yes    70 year old male 64months deteriorating gait status post cervical fusion in 2015 now with neck pain shoulder pain bilateral hand numbness tingling and loss of fine dexterity  Referred to neurosurgery for definitive management    70 year old male had knee arthroscopy presents with deteriorating gait and giving way of both knees no recent trauma other than the fall that he had and landed on his right knee with an abrasion  He is noticed over the last 4 months that is not walking well he has a nystagmus type gait he complains of numbness in both hands pain in his shoulders pain radiating from his neck down to his lower spine     Review of Systems  Constitutional: Negative for chills and fever.  Cardiovascular: Positive for chest pain.  Musculoskeletal: Positive for falls and neck pain.  Neurological: Positive for tingling and sensory change.    Past Medical History:  Diagnosis Date  . Anginal pain (H[facility])   . Anxiety   . Arthritis    both hands  . Asthma   . Chest pain 2005   noncritical coronary disease in 2005: 40% distal RCA, 30% CX, 50% OM 2, EF-60%,  . COPD (chronic obstructive pulmonary disease) ([facility])    Chronic bronchitis; 100-pack-year cigarette consumption  . Gastroesophageal reflux disease    possible small Schatzki's ring; esophageal dilatation in 2005  . H. pylori infection 2014   treated with prevpac  . Hyperlipidemia   . Hypertension   . Overweight(278.02)   . Tobacco abuse    100 pack years    Past Surgical History:  Procedure Laterality Date   . CERVICAL SPINE SURGERY     C5-6 and C6-7: Relief of spinal stenosis with corpectomy, foraminotomy and fusion  . COLONOSCOPY  10/19/2004   RMR:   Friable anal canal, likely source of patient's hematochezia/  Otherwise normal rectum, normal colon.  . COLONOSCOPY N/A 09/09/2015   RMR: Pancolonic diverticulosis. Multiple rectal polyps and 1 polyp in the ascending colon. Pathology with hyperplastic rectal polyps and tubular adenoma colon polyp. Repeat in 2021.  Marland Kitchen COLONOSCOPY WITH PROPOFOL N/A 09/14/2020   Procedure: COLONOSCOPY WITH PROPOFOL;  Surgeon: Corbin Ade, MD;  [facility];  Service: Endoscopy;  Laterality: N/A;  8:30am  . ESOPHAGEAL DILATION N/A 09/09/2015   Procedure: ESOPHAGEAL DILATION;  Surgeon: Corbin Ade, MD;  [facility];  Service: Endoscopy;  Laterality: N/A;  . ESOPHAGOGASTRODUODENOSCOPY  09/27/2004   RMR:   A couple of tiny, distal esophageal erosions, otherwise normal esophagus aside from a Schatzki ring status post Maloney dilation as described above/ Small hiatal hernia, otherwise normal stomach, normal D1-D2  . ESOPHAGOGASTRODUODENOSCOPY N/A 09/09/2015   Procedure: ESOPHAGOGASTRODUODENOSCOPY (EGD);  Surgeon: Corbin Ade, MD;  [facility];  Service: Endoscopy;  Laterality: N/A;  . ESOPHAGOGASTRODUODENOSCOPY N/A 04/19/2017   RMR: Normal esophagus. Dilated. Erosive gastropathy. Biopsied. Otherwise normal. Pathology with mild chronic gastritis. No H. pylori  .  ESOPHAGOGASTRODUODENOSCOPY (EGD) WITH ESOPHAGEAL DILATION N/A 03/19/2013   NIO:EVOJJK esophagus s/p dilation/Abnormal stomach of uncertain clinical significance-s/p bx positive for H. pylori, status post Prevpac treatment  . ESOPHAGOGASTRODUODENOSCOPY (EGD) WITH PROPOFOL N/A 10/02/2019   Procedure: ESOPHAGOGASTRODUODENOSCOPY (EGD) WITH PROPOFOL;  Surgeon: Corbin Ade, MD;  [facility];  Service: Endoscopy;  Laterality: N/A;  8:30am  . KNEE ARTHROSCOPY WITH MEDIAL  MENISECTOMY Right 12/06/2017   Procedure: RIGHT KNEE ARTHROSCOPY WITH PARTIAL MEDIAL MENISECTOMY; REMOVAL OF LOOSE BODY;  Surgeon: Vickki Hearing, MD;  Location: [facility];  Service: Orthopedics;  Laterality: Right;  . MALONEY DILATION N/A 04/19/2017   Procedure: Elease Hashimoto DILATION;  Surgeon: Corbin Ade, MD;  [facility];  Service: Endoscopy;  Laterality: N/A;  . Elease Hashimoto DILATION N/A 10/02/2019   Procedure: Elease Hashimoto DILATION;  Surgeon: Corbin Ade, MD;  [facility];  Service: Endoscopy;  Laterality: N/A;  . ORIF ANKLE FRACTURE     right  . ORIF TIBIA FRACTURE     Left  . TONSILLECTOMY      PHYSICAL EXAM  BP 124/75   Pulse 78   Ht 5\' 7"  (1.702 m)   Wt 174 lb (78.9 kg)   BMI 27.25 kg/m  GENERAL appearance reveals no gross abnormalities, normal development grooming and hygiene   MENTAL STATUS we note that the patient is awake alert and oriented to person place and time MOOD/AFFECT ARE NORMAL   GAIT reveals shuffling gait Note daughter says he walks fast and if he is not careful he will fall over forward   Ortho Exam   Stiffness in the cervical spine  Evidence of prior plate   VASC 2+ dorsalis pedis pulse normal capillary refill excellent warmth to the extremity  NEURO possible positive Hoffman's reflex  LYMPH deferred noncontributory  MEDICAL DECISION MAKING  A.  Encounter Diagnoses  Name Primary?  . Chronic pain of right knee Yes  . Chronic pain of left knee     B. DATA ANALYSED:  Internal images  IMAGING: Internal imaging Independent interpretation of images: Knee x-rays were notable for mild arthritis no secondary bone changes External imaging CT of the C-spine from 2015 shows a plate with a cervical fusion near the mid cervical levels 4 through 6   C. MANAGEMENT   Urgent referral to neurosurgery for spinal stenosis with possible myelopathy   No orders of the defined types were placed in this encounter.  (Chronic problem  with exacerbation, outside image review, MRI)

## 2020-11-01 NOTE — Patient Instructions (Signed)
Spinal Stenosis  Spinal stenosis happens when the open space (spinal canal) between the bones of your spine (vertebrae) gets smaller. It is caused by bone pushing into the open spaces of your backbone (spine). This puts pressure on your backbone and the nerves in your backbone. Treatment often focuses on managing any pain and symptoms. In some cases, surgery may be needed. Follow these instructions at home: Managing pain, stiffness, and swelling   Do all exercises and stretches as told by your doctor.  Stand and sit up straight (use good posture). If you were given a brace or a corset, wear it as told by your doctor.  Do not do any activities that cause pain. Ask your doctor what activities are safe for you.  Do not lift anything that is heavier than 10 lb (4.5 kg) or heavier than your doctor tells you.  Try to stay at a healthy weight. Talk with your doctor if you need help losing weight.  If directed, put heat on the affected area as often as told by your doctor. Use the heat source that your doctor recommends, such as a moist heat pack or a heating pad. ? Put a towel between your skin and the heat source. ? Leave the heat on for 20-30 minutes. ? Remove the heat if your skin turns bright red. This is especially important if you are not able to feel pain, heat, or cold. You may have a greater risk of getting burned. General instructions  Take over-the-counter and prescription medicines only as told by your doctor.  Do not use any products that contain nicotine or tobacco, such as cigarettes and e-cigarettes. If you need help quitting, ask your doctor.  Eat a healthy diet. This includes plenty of fruits and vegetables, whole grains, and low-fat (lean) protein.  Keep all follow-up visits as told by your doctor. This is important. Contact a doctor if:  Your symptoms do not get better.  Your symptoms get worse.  You have a fever. Get help right away if:  You have new or worse pain  in your neck or upper back.  You have very bad pain that medicine does not control.  You are dizzy.  You have vision problems, blurred vision, or double vision.  You have a very bad headache that is worse when you stand.  You feel sick to your stomach (nauseous).  You throw up (vomit).  You have new or worse numbness or tingling in your back or legs.  You have pain, redness, swelling, or warmth in your arm or leg. Summary  Spinal stenosis happens when the open space (spinal canal) between the bones of your spine gets smaller (narrow).  Contact a doctor if your symptoms get worse.  In some cases, surgery may be needed. This information is not intended to replace advice given to you by your health care provider. Make sure you discuss any questions you have with your health care provider. Document Revised: 09/28/2017 Document Reviewed: 09/20/2016 Elsevier Patient Education  2020 [employer]  

## 2020-11-10 ENCOUNTER — Other Ambulatory Visit: Payer: Self-pay | Admitting: Gastroenterology

## 2020-11-10 DIAGNOSIS — K5909 Other constipation: Secondary | ICD-10-CM

## 2020-12-03 ENCOUNTER — Encounter: Payer: Self-pay | Admitting: Internal Medicine

## 2020-12-03 ENCOUNTER — Other Ambulatory Visit: Payer: Self-pay

## 2020-12-03 ENCOUNTER — Other Ambulatory Visit: Payer: Self-pay | Admitting: Gastroenterology

## 2020-12-03 ENCOUNTER — Ambulatory Visit: Payer: Medicare HMO | Admitting: Internal Medicine

## 2020-12-03 VITALS — BP 126/82 | HR 74 | Temp 97.0°F | Ht 67.0 in | Wt 175.2 lb

## 2020-12-03 DIAGNOSIS — R1310 Dysphagia, unspecified: Secondary | ICD-10-CM

## 2020-12-03 DIAGNOSIS — K219 Gastro-esophageal reflux disease without esophagitis: Secondary | ICD-10-CM

## 2020-12-03 DIAGNOSIS — K5909 Other constipation: Secondary | ICD-10-CM | POA: Diagnosis not present

## 2020-12-03 MED ORDER — LUBIPROSTONE 24 MCG PO CAPS: 24.0000 ug | ORAL_CAPSULE | Freq: Two times a day (BID) | ORAL | 11 refills | Status: DC

## 2020-12-03 MED ORDER — ESOMEPRAZOLE MAGNESIUM 40 MG PO CPDR: 40.0000 mg | DELAYED_RELEASE_CAPSULE | Freq: Two times a day (BID) | ORAL | 11 refills | Status: DC

## 2020-12-03 NOTE — Telephone Encounter (Signed)
Trevor Berg, received refill requesting for [name] 24 mcg BID. States medication is not on formulary and requested an alternative. Can you follow-up on this. Not sure if PA is needed. Would need to address alternative with Dr. Gala Romney as he saw patient last.

## 2020-12-03 NOTE — Patient Instructions (Signed)
Continue Nexium 40 mg twice daily (dispense 60) 11 refills  Continue [name] 24 mcg twice daily (dispense 60 with 11 refills)  As discussed, we will proceed with a barium pill esophagram to further evaluate your swallowing difficulties  Recommend 1 more colonoscopy in 7 years  Office visit here in 3 months  Further recommendations to follow once I get the barium pill results back.  Information on GERD and constipation provided
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
## 2020-12-06 NOTE — Telephone Encounter (Signed)
Noted  

## 2020-12-06 NOTE — Telephone Encounter (Signed)
A PA will be submitted since medication isn't covered under pts insurance.

## 2020-12-22 NOTE — Telephone Encounter (Signed)
Patient has been on [name] but apparently insurance changed their formulary and it's not covered anymore. It's an RMR patient but he's out of the office. Can we find out what is on the formulary/what our options are? Also can we notify the patient of what's going on and that we're trying to figure out what our alternatives are and will reach out to him with options

## 2020-12-27 ENCOUNTER — Telehealth: Payer: Self-pay | Admitting: *Deleted

## 2020-12-27 NOTE — Telephone Encounter (Signed)
Patient spouse called in stating insurance will not cover the [place]. Reports a PA has to be done yearly.

## 2020-12-28 NOTE — Telephone Encounter (Signed)
PA was submitted through [email or web address]. waiting on an approval or denial.  

## 2020-12-29 NOTE — Telephone Encounter (Signed)
PA has been completed. Waiting on a response from pts insurance. Left a detailed message to let pt know we're waiting on a response from his insurance company.

## 2020-12-30 ENCOUNTER — Telehealth: Payer: Self-pay | Admitting: Internal Medicine

## 2020-12-30 NOTE — Telephone Encounter (Signed)
Pt's wife called asking about patient's [name] prescription. She said [facility] [place] still hasn't received anything from [place]. Please advise. [phone]

## 2020-12-30 NOTE — Telephone Encounter (Signed)
Spoke with pts insurance company. [name] was denied on 12/28/20 and then the insurance company turned around and approved it. [name] is $9.85. I spoke with the pharmacy and they are aware of approval. Pts spouse is aware of approval.

## 2020-12-31 NOTE — Telephone Encounter (Signed)
PA was approved. 

## 2021-01-22 ENCOUNTER — Other Ambulatory Visit: Payer: Self-pay | Admitting: Internal Medicine

## 2021-01-24 NOTE — Telephone Encounter (Signed)
Trevor Berg, pharmacy is requesting alternative for [name] stating it isn't covered. From what I can tell from prior documentation, [name] was covered. Can we find out if patient has had any trouble obtaining this medication?

## 2021-01-25 NOTE — Telephone Encounter (Signed)
I spoke with pt recently and they didn't have problems getting the medication. The PA was approved for 1 year. It may show that a PA is needed since it's still not the drug of choice under pts insurance plan.

## 2021-01-26 ENCOUNTER — Encounter: Payer: Self-pay | Admitting: Internal Medicine

## 2021-01-27 ENCOUNTER — Other Ambulatory Visit: Payer: Self-pay | Admitting: Internal Medicine

## 2021-02-18 ENCOUNTER — Ambulatory Visit: Payer: Medicare HMO | Admitting: Internal Medicine

## 2021-02-22 ENCOUNTER — Other Ambulatory Visit: Payer: Self-pay

## 2021-02-22 ENCOUNTER — Ambulatory Visit: Payer: Medicare HMO | Admitting: Internal Medicine

## 2021-02-22 ENCOUNTER — Encounter: Payer: Self-pay | Admitting: Internal Medicine

## 2021-02-22 VITALS — BP 121/67 | HR 65 | Temp 97.3°F | Ht 66.0 in | Wt 168.2 lb

## 2021-02-22 DIAGNOSIS — R1310 Dysphagia, unspecified: Secondary | ICD-10-CM | POA: Diagnosis not present

## 2021-02-22 DIAGNOSIS — K219 Gastro-esophageal reflux disease without esophagitis: Secondary | ICD-10-CM | POA: Diagnosis not present

## 2021-02-22 DIAGNOSIS — R109 Unspecified abdominal pain: Secondary | ICD-10-CM

## 2021-02-22 DIAGNOSIS — K5909 Other constipation: Secondary | ICD-10-CM

## 2021-02-22 DIAGNOSIS — R634 Abnormal weight loss: Secondary | ICD-10-CM

## 2021-02-22 NOTE — Patient Instructions (Signed)
We will schedule a CT angiogram of the abdomen and pelvis to evaluate the blood vessels going to your intestines.  We are doing this for abdominal pain and weight loss.  Continue esomeprazole/Nexium 40 mg twice daily  Continue [name] 24 mcg twice daily  Continue MiraLAX 1 capful each morning; take a second dose in the evening if he did not have a bowel movement on any given day  You may benefit from having her esophagus stretched again in the near future.  Will review the CT results prior to making any further recommendations.

## 2021-02-22 NOTE — Progress Notes (Signed)
Done

## 2021-02-22 NOTE — Progress Notes (Unsigned)
c 

## 2021-02-22 NOTE — Progress Notes (Signed)
Primary Care Physician:  Jani Gravel, MD Primary Gastroenterologist:  Dr. Gala Romney  Pre-Procedure History & Physical: HPI:  Trevor Berg is a 70 y.o. male here for follow-up of GERD constipation and abdominal pain.  GERD fairly well controlled on esomeprazole 40 mg twice daily.  Continues to have postprandial abdominal pain is lost some weight.  He had dysphagia markedly improved since esophageal dilation.  Still feels things slow down sometimes after he swallows.  Utilizing [name] and MiraLAX for bowel function.  He states he may still go a couple of days without a bowel movement.  Past Medical History:  Diagnosis Date  . Anginal pain ([place])   . Anxiety   . Arthritis    both hands  . Asthma   . Chest pain 2005   noncritical coronary disease in 2005: 40% distal RCA, 30% CX, 50% OM 2, EF-60%,  . COPD (chronic obstructive pulmonary disease) (H[facility])    Chronic bronchitis; 100-pack-year cigarette consumption  . Gastroesophageal reflux disease    possible small Schatzki's ring; esophageal dilatation in 2005  . H. pylori infection 2014   treated with prevpac  . Hyperlipidemia   . Hypertension   . Overweight(278.02)   . Tobacco abuse    100 pack years    Past Surgical History:  Procedure Laterality Date  . CERVICAL SPINE SURGERY     C5-6 and C6-7: Relief of spinal stenosis with corpectomy, foraminotomy and fusion  . COLONOSCOPY  10/19/2004   RMR:   Friable anal canal, likely source of patient's hematochezia/  Otherwise normal rectum, normal colon.  . COLONOSCOPY N/A 09/09/2015   RMR: Pancolonic diverticulosis. Multiple rectal polyps and 1 polyp in the ascending colon. Pathology with hyperplastic rectal polyps and tubular adenoma colon polyp. Repeat in 2021.  Marland Kitchen COLONOSCOPY WITH PROPOFOL N/A 09/14/2020   Procedure: COLONOSCOPY WITH PROPOFOL;  Surgeon: Daneil Dolin, MD;  [facility];  Service: Endoscopy;  Laterality: N/A;  8:30am  . ESOPHAGEAL DILATION N/A 09/09/2015    Procedure: ESOPHAGEAL DILATION;  Surgeon: Daneil Dolin, MD;  [facility];  Service: Endoscopy;  Laterality: N/A;  . ESOPHAGOGASTRODUODENOSCOPY  09/27/2004   RMR:   A couple of tiny, distal esophageal erosions, otherwise normal esophagus aside from a Schatzki ring status post Maloney dilation as described above/ Small hiatal hernia, otherwise normal stomach, normal D1-D2  . ESOPHAGOGASTRODUODENOSCOPY N/A 09/09/2015   Procedure: ESOPHAGOGASTRODUODENOSCOPY (EGD);  Surgeon: Daneil Dolin, MD;  [facility];  Service: Endoscopy;  Laterality: N/A;  . ESOPHAGOGASTRODUODENOSCOPY N/A 04/19/2017   RMR: Normal esophagus. Dilated. Erosive gastropathy. Biopsied. Otherwise normal. Pathology with mild chronic gastritis. No H. pylori  . ESOPHAGOGASTRODUODENOSCOPY (EGD) WITH ESOPHAGEAL DILATION N/A 03/19/2013   TFT:DDUKGU esophagus s/p dilation/Abnormal stomach of uncertain clinical significance-s/p bx positive for H. pylori, status post Prevpac treatment  . ESOPHAGOGASTRODUODENOSCOPY (EGD) WITH PROPOFOL N/A 10/02/2019   Procedure: ESOPHAGOGASTRODUODENOSCOPY (EGD) WITH PROPOFOL;  Surgeon: Daneil Dolin, MD;  [facility];  Service: Endoscopy;  Laterality: N/A;  8:30am  . KNEE ARTHROSCOPY WITH MEDIAL MENISECTOMY Right 12/06/2017   Procedure: RIGHT KNEE ARTHROSCOPY WITH PARTIAL MEDIAL MENISECTOMY; REMOVAL OF LOOSE BODY;  Surgeon: Carole Civil, MD;  [facility];  Service: Orthopedics;  Laterality: Right;  . MALONEY DILATION N/A 04/19/2017   Procedure: Venia Minks DILATION;  Surgeon: Daneil Dolin, MD;  [facility];  Service: Endoscopy;  Laterality: N/A;  . MALONEY DILATION N/A 10/02/2019   Procedure: MALONEY DILATION;  Surgeon:  Camauri Craton, [name], MD;  [facility];  Service: Endoscopy;  Laterality: N/A;  . ORIF ANKLE FRACTURE     right  . ORIF TIBIA FRACTURE     Left  . TONSILLECTOMY      Prior to Admission medications   Medication Sig Start Date  End Date Taking? Authorizing Provider  albuterol (PROVENTIL HFA;VENTOLIN HFA) 108 (90 BASE) MCG/ACT inhaler Inhale 2 puffs into the lungs every 6 (six) hours as needed for shortness of breath.    Yes [provider]  amLODipine (NORVASC) 2.5 MG tablet Take 2.5 mg by mouth daily.   Yes [provider]  ANORO ELLIPTA 62.5-25 MCG/INH AEPB Inhale 1 puff into the lungs daily.  03/08/15  Yes [provider]  aspirin EC 81 MG tablet Take 81 mg by mouth daily.   Yes [provider]  atenolol (TENORMIN) 50 MG tablet TAKE ONE TABLET BY MOUTH ONCE DAILY IN THE MORNING 07/27/16  Yes Branch, Alphonse Guild, MD  dextromethorphan-guaiFENesin [facility] DM) 30-600 MG 12hr tablet Take 1 tablet by mouth 2 (two) times daily.   Yes [provider]  Ergocalciferol (VITAMIN D2) 50 MCG (2000 UT) TABS Take 1 tablet by mouth daily.   Yes [provider]  esomeprazole (NEXIUM) 40 MG capsule Take 1 capsule (40 mg total) by mouth 2 (two) times daily before a meal. 12/03/20  Yes Cayman Kielbasa, [name], MD  fluticasone (FLONASE) 50 MCG/ACT nasal spray Place 2 sprays into both nostrils daily.   Yes [provider]  hydrochlorothiazide (MICROZIDE) 12.5 MG capsule TAKE 1 CAPSULE BY MOUTH ONCE A DAY FOR HTN/SWELLING 06/29/17  Yes [provider]  lisinopril (PRINIVIL,ZESTRIL) 40 MG tablet Take 1 tablet (40 mg total) by mouth daily. 11/06/16  Yes Branch, Alphonse Guild, MD  LORazepam (ATIVAN) 1 MG tablet Take 1 mg by mouth 3 (three) times daily as needed for anxiety.    Yes [provider]  lubiprostone ([name]) 24 MCG capsule Take 1 capsule (24 mcg total) by mouth 2 (two) times daily with a meal. 12/03/20  Yes Jarrick Fjeld, [name], MD  Naphazoline HCl (CLEAR EYES OP) Apply 1 drop to eye daily as needed (dry eyes).   Yes [provider]  nitroGLYCERIN (NITROSTAT) 0.4 MG SL tablet Place 0.4 mg under the tongue every 5 (five) minutes as needed for chest pain. Chest Pain   Yes  [provider]  oxyCODONE-acetaminophen (PERCOCET) 5-325 MG tablet Take 1-2 tablets by mouth every 6 (six) hours as needed. 10/10/20  Yes Delo, Nathaneil Canary, MD  polyethylene glycol powder (GLYCOLAX/MIRALAX) powder USE 1 CAPFUL DAILY AS NEEDED FOR CONSTIPATION 11/08/16  Yes Annitta Needs, NP  sertraline (ZOLOFT) 50 MG tablet Take 50 mg by mouth daily.   Yes [provider]  simvastatin (ZOCOR) 20 MG tablet Take 20 mg by mouth daily.   Yes [provider]  traZODone (DESYREL) 50 MG tablet Take 50 mg by mouth at bedtime. 04/09/17  Yes [provider]  vitamin B-12 (CYANOCOBALAMIN) 1000 MCG tablet Take 1,000 mcg by mouth daily.    Yes [provider]  zinc gluconate 50 MG tablet Take 50 mg by mouth daily.   Yes [provider]    Allergies as of 02/22/2021 - Review Complete 02/22/2021  Allergen Reaction Noted  . Chantix [varenicline]  02/23/2014  . Tomato Rash 06/16/2011    Family History  Problem Relation Age of Onset  . Heart attack Mother  deceased, age 26s  . Ulcers Brother   . Diabetes Brother   . Hypertension Other   . Colon cancer Neg Hx     Social History   Socioeconomic History  . Marital status: Married    Spouse name: Not on file  . Number of children: 3  . Years of education: Not on file  . Highest education level: Not on file  Occupational History  . Occupation: disability  Tobacco Use  . Smoking status: Current Every Day Smoker    Packs/day: 1.50    Years: 50.00    Pack years: 75.00    Types: Cigarettes  . Smokeless tobacco: Never Used  . Tobacco comment: 1 pack per day as of 08/2012  Vaping Use  . Vaping Use: Never used  Substance and Sexual Activity  . Alcohol use: No    Alcohol/week: 0.0 standard drinks    Comment: occasionally, usually holidays but sometimes on weekends, prior heavy use  . Drug use: No  . Sexual activity: Yes    Birth control/protection: None  Other Topics Concern  . Not on file   Social History Narrative  . Not on file   Social Determinants of Health   Financial Resource Strain: Not on file  Food Insecurity: Not on file  Transportation Needs: Not on file  Physical Activity: Not on file  Stress: Not on file  Social Connections: Not on file  Intimate Partner Violence: Not on file    Review of Systems: See HPI, otherwise negative ROS  Physical Exam: BP 121/67   Pulse 65   Temp (!) 97.3 F (36.3 C)   Ht 5\' 6"  (1.676 m)   Wt 168 lb 3.2 oz (76.3 kg)   BMI 27.15 kg/m  General:   Alert, frail  pleasant and cooperative in NAD -accompanied by his wife. Neck:  Supple; no masses or thyromegaly. No significant cervical adenopathy. Lungs:  Clear throughout to auscultation.   No wheezes, crackles, or rhonchi. No acute distress. Heart:  Regular rate and rhythm; no murmurs, clicks, rubs,  or gallops. Abdomen: Non-distended, normal bowel sounds.  Soft and nontender without appreciable mass or hepatosplenomegaly.  Pulses:  Normal pulses noted. Extremities:  Without clubbing or edema.  Impression/Plan: 70 year old gentleman with GERD fairly well controlled on twice daily PPI.  Improvement after esophagus was dilated previously.  Not totally resolved but improved at this point in time.  Somewhat recalcitrant constipation on above-mentioned regimen.  He may benefit from a second dose of MiraLAX daily.  Postprandial component to abdominal pain along with weight loss.  Ischemia not ruled out at this point in time.  History of colonic polyps; due for surveillance colonoscopy in 6 years.    Recommendations:  We will schedule a CT angiogram of the abdomen and pelvis to evaluate the blood vessels going to your intestines.  We are doing this for abdominal pain and weight loss.  Continue esomeprazole/Nexium 40 mg twice daily  Continue [name] 24 mcg twice daily  Continue MiraLAX 1 capful each morning; take a second dose in the evening if he did not have a bowel movement  on any given day  You may benefit from having her esophagus stretched again in the near future.  Will review the CT results prior to making any further recommendations.   Notice: This dictation was prepared with Dragon dictation along with smaller phrase technology. Any transcriptional errors that result from this process are unintentional and may not be corrected upon review.

## 2021-02-25 ENCOUNTER — Telehealth: Payer: Self-pay

## 2021-02-25 NOTE — Telephone Encounter (Signed)
No PA needed for CTA abd/pelvis per [employer] website: CPT Codes does not exist.

## 2021-03-02 NOTE — Progress Notes (Signed)
Cardiology Office Note  Date: 03/03/2021   ID: Trevor Berg, DOB 28-Jan-1951, MRN [id number]  PCP:  Jani Gravel, MD  Cardiologist:  Carlyle Dolly, MD Electrophysiologist:  None   Chief Complaint: Cardiac follow-up  History of Present Illness: Trevor Berg is a 70 y.o. male with a history of HTN, COPD, esophageal dysphagia, tobacco abuse, chest pain.  Last seen by Dr. Harl Bowie 04/24/2019.  Had a long history of chest pain.  Previous cardiac catheterization 2005 for mild to moderate nonobstructive disease.  Negative stress echo 10/18/2012 with baseline LVEF 55 to 60%.  2016 Lexiscan ordered due to chest pain.  Inferior defect due to subdiaphragmatic attenuation.  No ischemia.  EGD 2018 multiple erosions of the stomach.  He was compliant with his statin medications for hyperlipidemia.  He was compliant with antihypertensive medications.  He was compliant with inhalers for COPD.   He is here today for follow-up.  He states she has been having some chest pain with some associated nausea and sweating recently.  He states he has pain in his shoulders but is not certain as to whether the heart is causing the pain or his back.  He has had previous back surgery with an apparent cervical fusion.  His wife states there was concern that the hardware may be pressing on nerves in his back.  Also complaining of increased shortness of breath worse with exertion.  States this seems to have progressed over the last few months.  Complaining of bilateral leg pain worse with ambulation and relieved with resting.  Continues to smoke.  Long-term history of smoking up to 3 packs/day.  States he is currently smoking 1 pack/day.  His wife states they are going to switch to a new PCP soon.  She will call us with the name of the new provider.  He has some issues with esophageal dysphagia.  He has an upcoming CT angio of abdomen, pelvis ordered by Dr. Gala Romney on May 17.  Past Medical History:  Diagnosis Date  . Anginal pain  ([place])   . Anxiety   . Arthritis    both hands  . Asthma   . Chest pain 2005   noncritical coronary disease in 2005: 40% distal RCA, 30% CX, 50% OM 2, EF-60%,  . COPD (chronic obstructive pulmonary disease) ([facility])    Chronic bronchitis; 100-pack-year cigarette consumption  . Gastroesophageal reflux disease    possible small Schatzki's ring; esophageal dilatation in 2005  . H. pylori infection 2014   treated with prevpac  . Hyperlipidemia   . Hypertension   . Overweight(278.02)   . Tobacco abuse    100 pack years    Past Surgical History:  Procedure Laterality Date  . CERVICAL SPINE SURGERY     C5-6 and C6-7: Relief of spinal stenosis with corpectomy, foraminotomy and fusion  . COLONOSCOPY  10/19/2004   RMR:   Friable anal canal, likely source of patient's hematochezia/  Otherwise normal rectum, normal colon.  . COLONOSCOPY N/A 09/09/2015   RMR: Pancolonic diverticulosis. Multiple rectal polyps and 1 polyp in the ascending colon. Pathology with hyperplastic rectal polyps and tubular adenoma colon polyp. Repeat in 2021.  Marland Kitchen COLONOSCOPY WITH PROPOFOL N/A 09/14/2020   Procedure: COLONOSCOPY WITH PROPOFOL;  Surgeon: Daneil Dolin, MD;  [facility];  Service: Endoscopy;  Laterality: N/A;  8:30am  . ESOPHAGEAL DILATION N/A 09/09/2015   Procedure: ESOPHAGEAL DILATION;  Surgeon: Daneil Dolin, MD;  [facility];  Service:  Endoscopy;  Laterality: N/A;  . ESOPHAGOGASTRODUODENOSCOPY  09/27/2004   RMR:   A couple of tiny, distal esophageal erosions, otherwise normal esophagus aside from a Schatzki ring status post Maloney dilation as described above/ Small hiatal hernia, otherwise normal stomach, normal D1-D2  . ESOPHAGOGASTRODUODENOSCOPY N/A 09/09/2015   Procedure: ESOPHAGOGASTRODUODENOSCOPY (EGD);  Surgeon: Daneil Dolin, MD;  [facility];  Service: Endoscopy;  Laterality: N/A;  . ESOPHAGOGASTRODUODENOSCOPY N/A 04/19/2017   RMR: Normal esophagus. Dilated.  Erosive gastropathy. Biopsied. Otherwise normal. Pathology with mild chronic gastritis. No H. pylori  . ESOPHAGOGASTRODUODENOSCOPY (EGD) WITH ESOPHAGEAL DILATION N/A 03/19/2013   PNT:IRWERX esophagus s/p dilation/Abnormal stomach of uncertain clinical significance-s/p bx positive for H. pylori, status post Prevpac treatment  . ESOPHAGOGASTRODUODENOSCOPY (EGD) WITH PROPOFOL N/A 10/02/2019   Procedure: ESOPHAGOGASTRODUODENOSCOPY (EGD) WITH PROPOFOL;  Surgeon: Daneil Dolin, MD;  [facility];  Service: Endoscopy;  Laterality: N/A;  8:30am  . KNEE ARTHROSCOPY WITH MEDIAL MENISECTOMY Right 12/06/2017   Procedure: RIGHT KNEE ARTHROSCOPY WITH PARTIAL MEDIAL MENISECTOMY; REMOVAL OF LOOSE BODY;  Surgeon: Carole Civil, MD;  Location: [facility];  Service: Orthopedics;  Laterality: Right;  . MALONEY DILATION N/A 04/19/2017   Procedure: Venia Minks DILATION;  Surgeon: Daneil Dolin, MD;  [facility];  Service: Endoscopy;  Laterality: N/A;  . Venia Minks DILATION N/A 10/02/2019   Procedure: Venia Minks DILATION;  Surgeon: Daneil Dolin, MD;  [facility];  Service: Endoscopy;  Laterality: N/A;  . ORIF ANKLE FRACTURE     right  . ORIF TIBIA FRACTURE     Left  . TONSILLECTOMY      Current Outpatient Medications  Medication Sig Dispense Refill  . albuterol (PROVENTIL HFA;VENTOLIN HFA) 108 (90 BASE) MCG/ACT inhaler Inhale 2 puffs into the lungs every 6 (six) hours as needed for shortness of breath.     Marland Kitchen amLODipine (NORVASC) 2.5 MG tablet Take 2.5 mg by mouth daily.    Jearl Klinefelter ELLIPTA 62.5-25 MCG/INH AEPB Inhale 1 puff into the lungs daily.     Marland Kitchen aspirin EC 81 MG tablet Take 81 mg by mouth daily.    Marland Kitchen atenolol (TENORMIN) 50 MG tablet TAKE ONE TABLET BY MOUTH ONCE DAILY IN THE MORNING 30 tablet 3  . dextromethorphan-guaiFENesin (MUCINEX DM) 30-600 MG 12hr tablet Take 1 tablet by mouth 2 (two) times daily.    . Ergocalciferol (VITAMIN D2) 50 MCG (2000 UT) TABS Take 1 tablet by mouth  daily.    Marland Kitchen esomeprazole (NEXIUM) 40 MG capsule Take 1 capsule (40 mg total) by mouth 2 (two) times daily before a meal. 60 capsule 11  . fluticasone (FLONASE) 50 MCG/ACT nasal spray Place 2 sprays into both nostrils daily.    . hydrochlorothiazide (MICROZIDE) 12.5 MG capsule TAKE 1 CAPSULE BY MOUTH ONCE A DAY FOR HTN/SWELLING  3  . lisinopril (PRINIVIL,ZESTRIL) 40 MG tablet Take 1 tablet (40 mg total) by mouth daily. 90 tablet 3  . LORazepam (ATIVAN) 1 MG tablet Take 1 mg by mouth 3 (three) times daily as needed for anxiety.     Marland Kitchen lubiprostone ([name]) 24 MCG capsule Take 1 capsule (24 mcg total) by mouth 2 (two) times daily with a meal. 60 capsule 11  . Naphazoline HCl (CLEAR EYES OP) Apply 1 drop to eye daily as needed (dry eyes).    . nitroGLYCERIN (NITROSTAT) 0.4 MG SL tablet Place 0.4 mg under the tongue every 5 (five) minutes as needed for chest pain. Chest Pain    .  oxyCODONE-acetaminophen (PERCOCET) 5-325 MG tablet Take 1-2 tablets by mouth every 6 (six) hours as needed. 12 tablet 0  . polyethylene glycol powder (GLYCOLAX/MIRALAX) powder USE 1 CAPFUL DAILY AS NEEDED FOR CONSTIPATION 527 g 11  . sertraline (ZOLOFT) 50 MG tablet Take 50 mg by mouth daily.    . simvastatin (ZOCOR) 20 MG tablet Take 20 mg by mouth daily.    . traZODone (DESYREL) 50 MG tablet Take 50 mg by mouth at bedtime.  0  . vitamin B-12 (CYANOCOBALAMIN) 1000 MCG tablet Take 1,000 mcg by mouth daily.     Marland Kitchen zinc gluconate 50 MG tablet Take 50 mg by mouth daily.     No current facility-administered medications for this visit.   Allergies:  Chantix [varenicline] and Tomato   Social History: The patient  reports that he has been smoking cigarettes. He has a 75.00 pack-year smoking history. He has never used smokeless tobacco. He reports that he does not drink alcohol and does not use drugs.   Family History: The patient's family history includes Diabetes in his brother; Heart attack in his mother; Hypertension in an  other family member; Ulcers in his brother.   ROS:  Please see the history of present illness. Otherwise, complete review of systems is positive for none.  All other systems are reviewed and negative.   Physical Exam: VS:  BP 124/72   Pulse (!) 59   Ht 5\' 6"  (1.676 m)   Wt 172 lb (78 kg)   SpO2 96%   BMI 27.76 kg/m , BMI Body mass index is 27.76 kg/m.  Wt Readings from Last 3 Encounters:  03/03/21 172 lb (78 kg)  02/22/21 168 lb 3.2 oz (76.3 kg)  12/03/20 175 lb 3.2 oz (79.5 kg)    General: Patient appears comfortable at rest. Neck: Supple, no elevated JVP or carotid bruits, no thyromegaly. Lungs: Clear to auscultation, nonlabored breathing at rest. Cardiac: Regular rate and rhythm, no S3 or significant systolic murmur, no pericardial rub. Extremities: No pitting edema, distal pulses 2+. Skin: Warm and dry. Musculoskeletal: No kyphosis. Neuropsychiatric: Alert and oriented x3, affect grossly appropriate.  ECG:  EKG 10/10/2020 sinus rhythm with a rate of 77.  Recent Labwork: 10/10/2020: BUN 14; Creatinine, Ser 0.93; Hemoglobin 12.4; Platelets 245; Potassium 2.9; Sodium 125  No results found for: CHOL, TRIG, HDL, CHOLHDL, VLDL, LDLCALC, LDLDIRECT  Other Studies Reviewed Today:  Diagnostic Studies 07/2004 Cath The left main is a large vessel with no significant disease.  The LAD is a medium size vessel which courses three quarters of the way down the anterior wall and has no significant disease. There are two small diagonal branches with no significant disease.  The left circumflex is a large vessel coursing through the AV groove. The third obtuse marginal branch is free of the AV groove circumflex and noted to have a mild 30% narrowing after the takeoff the second OM.  The first OM is a small vessel which bifurcates in the proximal portion with no significant disease.  The second OM is a large vessel which bifurcates distally and has 50% mid vessel  stenosis.  The third OM is a small vessel with no significant disease.  The right coronary artery is a large vessel which is dominant and gives rise to both the PDA as well as the posterolateral branch. The RCA is ectatic but has no high grade stenosis.  The PDA is a medium size vessel with no significant disease.  The PLA is a  large vessel which bifurcates in the mid segment. There is 40% lesion in the proximal portion fo the lower bifurcation.  Left ventriculogram reveals a preserved ejection fraction of 60%.  Hemodynamics: The systemic arterial pressure was 112/69. Left ventricular pressure was 115/8. Left ventricular end diastolic pressure was 13.  CONCLUSION: 1. Non-critical coronary artery disease. 2. Normal left ventricular systolic function.   09/2012 Stress echo Study Conclusions  - Stress: There was resting hypertension with a hypertensive response to stress. Atypical chest pain was present before and during exercise without increase in intensity, and it persisted following conclusion of the test. - Stress ECG conclusions: No diagnostic ST abnormalities or arrhythmias. The stress ECG was negative for ischemia. Duke scoring: exercise time of 48min; maximum ST deviation of 63mm; angina present but did not limit exercise; resulting score is -1. This score predicts a moderate risk of cardiac events. - Staged echo: There was no echocardiographic evidence for stress-induced ischemia. - Peak stress: LV global systolic function was vigorous. The estimated LV ejection fraction was 70% to 75%. No evidence for new LV regional wall motion abnormalities.   07/2015 Nuclear stress test  There was no ST segment deviation noted during stress.  Defect 1: There is a medium defect of mild severity present in the basal inferior, mid inferior and apical inferior location, which is likely due to soft tissue attenuation artifact. A  mild degree of myocardial scar cannot entirely be ruled out.  This is a low risk study.  Nuclear stress EF: 62%.   Assessment and Plan:  1. Essential hypertension   2. Mixed hyperlipidemia   3. Chest pain, unspecified type   4. Shortness of breath   5. Bilateral leg pain   6. Tobacco abuse    1. Essential hypertension Blood pressure well controlled on current therapy.  BP today 124/72.  Continue amlodipine 2.5 mg daily.  Continue atenolol 50 mg p.o. daily.  Continue HCTZ 12.5 mg p.o. daily.  Continue lisinopril 40 mg p.o. daily.  2. Mixed hyperlipidemia Continue simvastatin 20 mg p.o. daily.  3. Chest pain, unspecified type Complaining of chest pain at rest and with activity.  States pain is worse with activity.  States he has some shoulder pain bilaterally but is unsure if it is coming from his heart or from degenerative disease.  He states that his shoulder pain can occur without chest pain.  He has had surgery on his spine in the past.  States he does have some occasional nausea and sweating when chest pain occurs.  Please get a Lexiscan Myoview to assess for ischemic cause of chest pain.  Start Imdur 15 mg daily.  Continue sublingual nitroglycerin as needed.  Continue aspirin 81 mg daily.  4. Shortness of breath Complaining of increasing shortness of breath worse with exertion.  States it seems to have progressed over the last few months.  Please get a repeat echocardiogram to assess LV function, diastolic function, valvular function.  5. Bilateral leg pain Complaining of bilateral calf and thigh pain when ambulating and relieved at rest.  Please get a lower extremity arterial duplex study for symptoms of claudication-like pain  6. Tobacco abuse Long history of smoking up to 3 packs/day for many years.  States he is down to 1 pack/day now.  Highly encouraged cessation.  Medication Adjustments/Labs and Tests Ordered: Current medicines are reviewed at length with the patient  today.  Concerns regarding medicines are outlined above.   Disposition: Follow-up with Dr. Harl Bowie or APP 6  to 8 weeks  Signed, Levell July, NP 03/03/2021 1:56 PM    [facility] at [address], [place], [place] [address] Phone: [phone]; Fax: [phone]

## 2021-03-03 ENCOUNTER — Encounter: Payer: Self-pay | Admitting: *Deleted

## 2021-03-03 ENCOUNTER — Encounter: Payer: Self-pay | Admitting: Family Medicine

## 2021-03-03 ENCOUNTER — Ambulatory Visit: Payer: Medicare HMO | Admitting: Family Medicine

## 2021-03-03 VITALS — BP 124/72 | HR 59 | Ht 66.0 in | Wt 172.0 lb

## 2021-03-03 DIAGNOSIS — M79604 Pain in right leg: Secondary | ICD-10-CM

## 2021-03-03 DIAGNOSIS — R0602 Shortness of breath: Secondary | ICD-10-CM

## 2021-03-03 DIAGNOSIS — R079 Chest pain, unspecified: Secondary | ICD-10-CM

## 2021-03-03 DIAGNOSIS — I739 Peripheral vascular disease, unspecified: Secondary | ICD-10-CM

## 2021-03-03 DIAGNOSIS — I10 Essential (primary) hypertension: Secondary | ICD-10-CM

## 2021-03-03 DIAGNOSIS — M79605 Pain in left leg: Secondary | ICD-10-CM

## 2021-03-03 DIAGNOSIS — E782 Mixed hyperlipidemia: Secondary | ICD-10-CM | POA: Diagnosis not present

## 2021-03-03 DIAGNOSIS — Z720 Tobacco use: Secondary | ICD-10-CM

## 2021-03-03 DIAGNOSIS — R0789 Other chest pain: Secondary | ICD-10-CM

## 2021-03-03 MED ORDER — ISOSORBIDE MONONITRATE ER 30 MG PO TB24: 15.0000 mg | ORAL_TABLET | Freq: Every day | ORAL | 6 refills | Status: DC

## 2021-03-03 MED ORDER — ISOSORBIDE MONONITRATE ER 30 MG PO TB24: 30.0000 mg | ORAL_TABLET | Freq: Every day | ORAL | 3 refills | Status: DC

## 2021-03-03 NOTE — Patient Instructions (Addendum)
Medication Instructions:   Begin Imdur 15mg  daily.   Continue all other medications.    Labwork: none  Testing/Procedures:  Your physician has requested that you have an echocardiogram. Echocardiography is a painless test that uses sound waves to create images of your heart. It provides your doctor with information about the size and shape of your heart and how well your heart's chambers and valves are working. This procedure takes approximately one hour. There are no restrictions for this procedure.  Your physician has requested that you have a lexiscan myoview. For further information please visit [email or web address]. Please follow instruction sheet, as given.  Your physician has requested that you have a lower extremity arterial duplex. During this test, ultrasound is used to evaluate arterial blood flow in the legs. Allow one hour for this exam. There are no restrictions or special instructions.  Office will contact with results via phone or letter.    Follow-Up: 6-8 weeks   Any Other Special Instructions Will Be Listed Below (If Applicable).  If you need a refill on your cardiac medications before your next appointment, please call your pharmacy.

## 2021-03-08 ENCOUNTER — Encounter (HOSPITAL_BASED_OUTPATIENT_CLINIC_OR_DEPARTMENT_OTHER)
Admission: RE | Admit: 2021-03-08 | Discharge: 2021-03-08 | Disposition: A | Discharge status: Home / Self Care | Payer: Medicare HMO | Source: Ambulatory Visit | Attending: Family Medicine | Admitting: Family Medicine

## 2021-03-08 ENCOUNTER — Encounter (HOSPITAL_COMMUNITY)
Admission: RE | Admit: 2021-03-08 | Discharge: 2021-03-08 | Disposition: A | Discharge status: Home / Self Care | Payer: Medicare HMO | Source: Ambulatory Visit | Attending: Family Medicine | Admitting: Family Medicine

## 2021-03-08 DIAGNOSIS — R079 Chest pain, unspecified: Secondary | ICD-10-CM | POA: Diagnosis not present

## 2021-03-08 DIAGNOSIS — R0789 Other chest pain: Secondary | ICD-10-CM

## 2021-03-08 LAB — NM MYOCAR MULTI W/SPECT W/WALL MOTION / EF
LV dias vol: 95 mL (ref 62–150)
LV sys vol: 28 mL
Peak HR: 83 {beats}/min
RATE: 0.3
Rest HR: 61 {beats}/min
SRS: 5
SSS: 8
TID: 0.89

## 2021-03-08 MED ORDER — SODIUM CHLORIDE FLUSH 0.9 % IV SOLN
INTRAVENOUS | Status: AC
  Administered 2021-03-08: 10 mL via INTRAVENOUS
  Filled 2021-03-08: qty 10

## 2021-03-08 MED ORDER — REGADENOSON 0.4 MG/5ML IV SOLN
INTRAVENOUS | Status: AC
  Administered 2021-03-08: 0.4 mg via INTRAVENOUS
  Filled 2021-03-08: qty 5

## 2021-03-08 MED ORDER — TECHNETIUM TC 99M TETROFOSMIN IV KIT
10.0000 | PACK | Freq: Once | INTRAVENOUS | Status: AC | PRN
  Administered 2021-03-08: 11 via INTRAVENOUS

## 2021-03-08 MED ORDER — TECHNETIUM TC 99M TETROFOSMIN IV KIT
30.0000 | PACK | Freq: Once | INTRAVENOUS | Status: AC | PRN
  Administered 2021-03-08: 31 via INTRAVENOUS

## 2021-03-11 ENCOUNTER — Telehealth: Payer: Self-pay

## 2021-03-11 NOTE — Telephone Encounter (Signed)
[employer] back, spoke to [place], she said Josem Kaufmann was created. She placed call on hold to check case. Call was then disconnected.  PA for CTA submitted via [employer] website--[employer] national portal. PA# [id number], valid 03/11/21-09/07/21.  Called and informed Tillie Rung CTA was approved.

## 2021-03-11 NOTE — Telephone Encounter (Signed)
[employer] back, spoke to [place], she said Josem Kaufmann was created. She placed call on hold to check case. Call was then disconnected.

## 2021-03-11 NOTE — Telephone Encounter (Signed)
Received voicemail from Melody Hill at pre-service center, CTA scheduled for 03/15/21 needs PA. [phone], ext [phone])  Estill Batten, started PA for CTA. Rep was given details then call was disconnected

## 2021-03-15 ENCOUNTER — Other Ambulatory Visit: Payer: Self-pay

## 2021-03-15 ENCOUNTER — Encounter (HOSPITAL_COMMUNITY): Payer: Self-pay | Admitting: Radiology

## 2021-03-15 ENCOUNTER — Ambulatory Visit (HOSPITAL_COMMUNITY)
Admission: RE | Admit: 2021-03-15 | Discharge: 2021-03-15 | Disposition: A | Discharge status: Home / Self Care | Payer: Medicare HMO | Source: Ambulatory Visit | Attending: Internal Medicine | Admitting: Internal Medicine

## 2021-03-15 DIAGNOSIS — R634 Abnormal weight loss: Secondary | ICD-10-CM | POA: Insufficient documentation

## 2021-03-15 DIAGNOSIS — R109 Unspecified abdominal pain: Secondary | ICD-10-CM

## 2021-03-15 LAB — POCT I-STAT CREATININE: Creatinine, Ser: 1.3 mg/dL — ABNORMAL HIGH (ref 0.61–1.24)

## 2021-03-15 MED ORDER — IOHEXOL 350 MG/ML SOLN
100.0000 mL | Freq: Once | INTRAVENOUS | Status: AC | PRN
  Administered 2021-03-15: 100 mL via INTRAVENOUS

## 2021-03-17 ENCOUNTER — Telehealth: Payer: Self-pay | Admitting: *Deleted

## 2021-03-17 NOTE — Telephone Encounter (Signed)
-----   Message from Verta Ellen., NP sent at 03/09/2021  3:25 PM EDT ----- Please call the patient let him know the stress test did not show any evidence of lack of blood flow through the heart.  It was considered a low risk study by the interpreting physician.

## 2021-03-17 NOTE — Telephone Encounter (Signed)
Laurine Blazer, LPN  6/57/9038 3:33 AM EDT Back to Top     Vaughan Basta (wife) notified. Copy to pcp

## 2021-03-23 ENCOUNTER — Other Ambulatory Visit: Payer: Self-pay

## 2021-03-23 ENCOUNTER — Emergency Department (HOSPITAL_COMMUNITY): Payer: Medicare HMO

## 2021-03-23 ENCOUNTER — Encounter (HOSPITAL_COMMUNITY): Payer: Self-pay

## 2021-03-23 ENCOUNTER — Emergency Department (HOSPITAL_COMMUNITY)
Admission: EM | Admit: 2021-03-23 | Discharge: 2021-03-23 | Disposition: A | Discharge status: Home / Self Care | Payer: Medicare HMO | Attending: Emergency Medicine | Admitting: Emergency Medicine

## 2021-03-23 ENCOUNTER — Telehealth: Payer: Self-pay | Admitting: *Deleted

## 2021-03-23 DIAGNOSIS — Z79899 Other long term (current) drug therapy: Secondary | ICD-10-CM | POA: Insufficient documentation

## 2021-03-23 DIAGNOSIS — R61 Generalized hyperhidrosis: Secondary | ICD-10-CM | POA: Insufficient documentation

## 2021-03-23 DIAGNOSIS — J45909 Unspecified asthma, uncomplicated: Secondary | ICD-10-CM | POA: Insufficient documentation

## 2021-03-23 DIAGNOSIS — K5900 Constipation, unspecified: Secondary | ICD-10-CM | POA: Insufficient documentation

## 2021-03-23 DIAGNOSIS — R1013 Epigastric pain: Secondary | ICD-10-CM | POA: Diagnosis not present

## 2021-03-23 DIAGNOSIS — K219 Gastro-esophageal reflux disease without esophagitis: Secondary | ICD-10-CM | POA: Insufficient documentation

## 2021-03-23 DIAGNOSIS — I723 Aneurysm of iliac artery: Secondary | ICD-10-CM

## 2021-03-23 DIAGNOSIS — F17210 Nicotine dependence, cigarettes, uncomplicated: Secondary | ICD-10-CM | POA: Insufficient documentation

## 2021-03-23 DIAGNOSIS — J449 Chronic obstructive pulmonary disease, unspecified: Secondary | ICD-10-CM | POA: Insufficient documentation

## 2021-03-23 DIAGNOSIS — Z7982 Long term (current) use of aspirin: Secondary | ICD-10-CM | POA: Insufficient documentation

## 2021-03-23 DIAGNOSIS — I10 Essential (primary) hypertension: Secondary | ICD-10-CM | POA: Diagnosis not present

## 2021-03-23 DIAGNOSIS — R112 Nausea with vomiting, unspecified: Secondary | ICD-10-CM | POA: Diagnosis not present

## 2021-03-23 LAB — CBC WITH DIFFERENTIAL/PLATELET
Abs Immature Granulocytes: 0.04 10*3/uL (ref 0.00–0.07)
Basophils Absolute: 0.1 10*3/uL (ref 0.0–0.1)
Basophils Relative: 1 %
Eosinophils Absolute: 0.1 10*3/uL (ref 0.0–0.5)
Eosinophils Relative: 2 %
HCT: 36.5 % — ABNORMAL LOW (ref 39.0–52.0)
Hemoglobin: 12.4 g/dL — ABNORMAL LOW (ref 13.0–17.0)
Immature Granulocytes: 1 %
Lymphocytes Relative: 29 %
Lymphs Abs: 1.8 10*3/uL (ref 0.7–4.0)
MCH: 31.6 pg (ref 26.0–34.0)
MCHC: 34 g/dL (ref 30.0–36.0)
MCV: 92.9 fL (ref 80.0–100.0)
Monocytes Absolute: 0.6 10*3/uL (ref 0.1–1.0)
Monocytes Relative: 10 %
Neutro Abs: 3.7 10*3/uL (ref 1.7–7.7)
Neutrophils Relative %: 57 %
Platelets: 187 10*3/uL (ref 150–400)
RBC: 3.93 MIL/uL — ABNORMAL LOW (ref 4.22–5.81)
RDW: 13.1 % (ref 11.5–15.5)
WBC: 6.3 10*3/uL (ref 4.0–10.5)
nRBC: 0 % (ref 0.0–0.2)

## 2021-03-23 LAB — COMPREHENSIVE METABOLIC PANEL WITH GFR
ALT: 12 U/L (ref 0–44)
AST: 13 U/L — ABNORMAL LOW (ref 15–41)
Albumin: 4 g/dL (ref 3.5–5.0)
Alkaline Phosphatase: 57 U/L (ref 38–126)
Anion gap: 7 (ref 5–15)
BUN: 16 mg/dL (ref 8–23)
CO2: 27 mmol/L (ref 22–32)
Calcium: 8.7 mg/dL — ABNORMAL LOW (ref 8.9–10.3)
Chloride: 96 mmol/L — ABNORMAL LOW (ref 98–111)
Creatinine, Ser: 0.86 mg/dL (ref 0.61–1.24)
GFR, Estimated: >60 mL/min
Glucose, Bld: 97 mg/dL (ref 70–99)
Potassium: 4 mmol/L (ref 3.5–5.1)
Sodium: 130 mmol/L — ABNORMAL LOW (ref 135–145)
Total Bilirubin: 0.5 mg/dL (ref 0.3–1.2)
Total Protein: 6.4 g/dL — ABNORMAL LOW (ref 6.5–8.1)

## 2021-03-23 LAB — LIPASE, BLOOD: Lipase: 74 U/L — ABNORMAL HIGH (ref 11–51)

## 2021-03-23 MED ORDER — SUCRALFATE 1 G PO TABS: 1.0000 g | ORAL_TABLET | Freq: Three times a day (TID) | ORAL | 0 refills | Status: DC

## 2021-03-23 MED ORDER — ONDANSETRON 4 MG PO TBDP: 4.0000 mg | ORAL_TABLET | Freq: Three times a day (TID) | ORAL | 0 refills | Status: DC | PRN

## 2021-03-23 MED ORDER — MAGNESIUM CITRATE PO SOLN: 1.0000 | Freq: Once | ORAL | 0 refills | Status: AC

## 2021-03-23 NOTE — Telephone Encounter (Signed)
Spoke with wife (listed on [place]).  She was informed that pt needs to go to ER to be checked out.  She voiced understanding and said that she would inform him.

## 2021-03-23 NOTE — ED Triage Notes (Signed)
Pt reports was sent here by GI, says he hasn't had a bm in "2-3 weeks."  Reports has been taking miralax and says is on a prescription to help him have a bm but doesn't know what it is.

## 2021-03-23 NOTE — Discharge Instructions (Signed)
Your laboratory work-up and CT imaging was reviewed and reassuring.  Given your episodes of nausea and vomiting, please take the Zofran ODT, as directed.  I have also prescribed a bottle of magnesium citrate to see if that will help flush your system, although there was no evidence of obstruction or large amounts of stool visualized on CT today.  I would like for you to take Carafate, as well.  This may help treat your upper abdominal discomfort and nausea symptoms.  Perhaps this is related to gastritis and you may also benefit from repeat upper endoscopy.  You need to have close follow-up with your gastroenterologist regarding today's ED encounter and for ongoing evaluation and management of your chronic constipation.  In the interim, please increase your oral hydration, fiber intake, and exercise.  As we discussed, I also feel as though you would benefit from a careful review of all of your prescribed medications given that you admittedly do not know what they all are for and that they could be contributing to your ongoing constipation issues.  Return to the ER seek immediate medical attention should you experience any new or worsening symptoms.

## 2021-03-23 NOTE — Telephone Encounter (Signed)
Communication noted.  If the information provided is accurate,  I strongly urge him to go to the emergency room.

## 2021-03-23 NOTE — ED Provider Notes (Signed)
[facility] Provider Note   CSN: [id number] Arrival date & time: 03/23/21  1504     History No chief complaint on file.   Trevor Berg is a 70 y.o. male with PMH significant for GERD colonic polyps, diverticulosis, and constipation followed by [facility] who presents the ED with complaints of constipation.  I reviewed patient's medical record and he had a CT angio of the abdomen and pelvis obtained 03/15/2021 which was without any acute findings.  There was no significant proximal visceral arterial occlusive disease.  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  When the medical assistant contacted the patient today advising him of the findings, he complained of epigastric region abdominal pain and 4+ week history of constipation despite taking medications as directed.  Dr. Gala Romney, GI, urged him to go to the ED for evaluation.  During that most recent encounter 02/22/2021 he was encouraged to continue [name] 24 mcg twice daily in addition to MiraLAX 1 capful each morning, with a second dose in the afternoon/evening if he did not have a bowel movement that day.  On my examination, patient reports that he has been taking all of his medications, as directed.  He takes both the [name] and MiraLAX twice daily.  His chart has chronic constipation dating back to before 2020 and I asked him what was different.  He states that he got a phone call about vascular abnormality seen on CT angio and he mentioned that he has been feeling sweaty "all over" and that his belly has been hurting for weeks.  His subjective fevers and chills have been intermittent.  He states that he will sit on the bathroom and pass gas, but does not ever see any stool in the toilet.  Patient reports that he has been eating and drinking well.  He states that he has also been having episodes of nausea and emesis, nonbloody.  He denies any chest  pain or difficulty breathing.  He also denies any urinary symptoms.  Denies history of abdominal surgeries.  Denies any rectal pain.    HPI     Past Medical History:  Diagnosis Date  . Anginal pain ([place])   . Anxiety   . Arthritis    both hands  . Asthma   . Chest pain 2005   noncritical coronary disease in 2005: 40% distal RCA, 30% CX, 50% OM 2, EF-60%,  . COPD (chronic obstructive pulmonary disease) ([facility])    Chronic bronchitis; 100-pack-year cigarette consumption  . Gastroesophageal reflux disease    possible small Schatzki's ring; esophageal dilatation in 2005  . H. pylori infection 2014   treated with prevpac  . Hyperlipidemia   . Hypertension   . Overweight(278.02)   . Tobacco abuse    100 pack years    Patient Active Problem List   Diagnosis Date Noted  . Gastroesophageal reflux disease 07/23/2019  . S/P right knee arthroscopy 12/06/17 12/13/2017  . Derang of medial meniscus due to old tear/inj, right knee   . Loose body in knee, right knee   . Mucosal abnormality of stomach   . Dysphagia   . History of colonic polyps   . Diverticulosis of colon without hemorrhage   . Essential hypertension 02/23/2014  . COPD (chronic obstructive pulmonary disease) ([place]) 11/07/2013  . COPD exacerbation ([place]) 11/06/2013  . Pneumonia and influenza 11/06/2013  . Cough with sputum 05/07/2013  . Esophageal dysphagia 02/25/2013  .  Chronic constipation 02/25/2013  . Abdominal pain, chronic, epigastric 02/25/2013  . Chest pain   . Tobacco abuse     Past Surgical History:  Procedure Laterality Date  . CERVICAL SPINE SURGERY     C5-6 and C6-7: Relief of spinal stenosis with corpectomy, foraminotomy and fusion  . COLONOSCOPY  10/19/2004   RMR:   Friable anal canal, likely source of patient's hematochezia/  Otherwise normal rectum, normal colon.  . COLONOSCOPY N/A 09/09/2015   RMR: Pancolonic diverticulosis. Multiple rectal polyps and 1 polyp in the ascending colon. Pathology with  hyperplastic rectal polyps and tubular adenoma colon polyp. Repeat in 2021.  Marland Kitchen COLONOSCOPY WITH PROPOFOL N/A 09/14/2020   Procedure: COLONOSCOPY WITH PROPOFOL;  Surgeon: Daneil Dolin, MD;  [facility];  Service: Endoscopy;  Laterality: N/A;  8:30am  . ESOPHAGEAL DILATION N/A 09/09/2015   Procedure: ESOPHAGEAL DILATION;  Surgeon: Daneil Dolin, MD;  [facility];  Service: Endoscopy;  Laterality: N/A;  . ESOPHAGOGASTRODUODENOSCOPY  09/27/2004   RMR:   A couple of tiny, distal esophageal erosions, otherwise normal esophagus aside from a Schatzki ring status post Maloney dilation as described above/ Small hiatal hernia, otherwise normal stomach, normal D1-D2  . ESOPHAGOGASTRODUODENOSCOPY N/A 09/09/2015   Procedure: ESOPHAGOGASTRODUODENOSCOPY (EGD);  Surgeon: Daneil Dolin, MD;  [facility];  Service: Endoscopy;  Laterality: N/A;  . ESOPHAGOGASTRODUODENOSCOPY N/A 04/19/2017   RMR: Normal esophagus. Dilated. Erosive gastropathy. Biopsied. Otherwise normal. Pathology with mild chronic gastritis. No H. pylori  . ESOPHAGOGASTRODUODENOSCOPY (EGD) WITH ESOPHAGEAL DILATION N/A 03/19/2013   KPT:WSFKCL esophagus s/p dilation/Abnormal stomach of uncertain clinical significance-s/p bx positive for H. pylori, status post Prevpac treatment  . ESOPHAGOGASTRODUODENOSCOPY (EGD) WITH PROPOFOL N/A 10/02/2019   Procedure: ESOPHAGOGASTRODUODENOSCOPY (EGD) WITH PROPOFOL;  Surgeon: Daneil Dolin, MD;  [facility];  Service: Endoscopy;  Laterality: N/A;  8:30am  . KNEE ARTHROSCOPY WITH MEDIAL MENISECTOMY Right 12/06/2017   Procedure: RIGHT KNEE ARTHROSCOPY WITH PARTIAL MEDIAL MENISECTOMY; REMOVAL OF LOOSE BODY;  Surgeon: Carole Civil, MD;  Location: [facility];  Service: Orthopedics;  Laterality: Right;  . MALONEY DILATION N/A 04/19/2017   Procedure: Venia Minks DILATION;  Surgeon: Daneil Dolin, MD;  [facility];  Service: Endoscopy;  Laterality: N/A;  . Venia Minks  DILATION N/A 10/02/2019   Procedure: Venia Minks DILATION;  Surgeon: Daneil Dolin, MD;  [facility];  Service: Endoscopy;  Laterality: N/A;  . ORIF ANKLE FRACTURE     right  . ORIF TIBIA FRACTURE     Left  . TONSILLECTOMY         Family History  Problem Relation Age of Onset  . Heart attack Mother        deceased, age 82s  . Ulcers Brother   . Diabetes Brother   . Hypertension Other   . Colon cancer Neg Hx     Social History   Tobacco Use  . Smoking status: Current Every Day Smoker    Packs/day: 1.50    Years: 50.00    Pack years: 75.00    Types: Cigarettes  . Smokeless tobacco: Never Used  . Tobacco comment: 1 pack per day as of 08/2012  Vaping Use  . Vaping Use: Never used  Substance Use Topics  . Alcohol use: No    Alcohol/week: 0.0 standard drinks    Comment: occasionally, usually holidays but sometimes on weekends, prior heavy use  . Drug use: No    Home Medications Prior  to Admission medications   Medication Sig Start Date End Date Taking? Authorizing Provider  magnesium citrate SOLN Take 296 mLs (1 Bottle total) by mouth once for 1 dose. 03/23/21 03/23/21 Yes Corena Herter, PA-C  ondansetron (ZOFRAN ODT) 4 MG disintegrating tablet Take 1 tablet (4 mg total) by mouth every 8 (eight) hours as needed for nausea or vomiting. 03/23/21  Yes Corena Herter, PA-C  sucralfate (CARAFATE) 1 g tablet Take 1 tablet (1 g total) by mouth 3 (three) times daily with meals for 10 days. 03/23/21 04/02/21 Yes Corena Herter, PA-C  albuterol (PROVENTIL HFA;VENTOLIN HFA) 108 (90 BASE) MCG/ACT inhaler Inhale 2 puffs into the lungs every 6 (six) hours as needed for shortness of breath.     [provider]  amLODipine (NORVASC) 2.5 MG tablet Take 2.5 mg by mouth daily.    [provider]  ANORO ELLIPTA 62.5-25 MCG/INH AEPB Inhale 1 puff into the lungs daily.  03/08/15   [provider]  aspirin EC 81 MG tablet Take 81 mg by mouth daily.    [provider]  atenolol (TENORMIN) 50 MG tablet TAKE ONE TABLET BY MOUTH ONCE DAILY IN THE MORNING 07/27/16   Arnoldo Lenis, MD  dextromethorphan-guaiFENesin [facility] DM) 30-600 MG 12hr tablet Take 1 tablet by mouth 2 (two) times daily.    [provider]  Ergocalciferol (VITAMIN D2) 50 MCG (2000 UT) TABS Take 1 tablet by mouth daily.    [provider]  esomeprazole (NEXIUM) 40 MG capsule Take 1 capsule (40 mg total) by mouth 2 (two) times daily before a meal. 12/03/20   Rourk, [name], MD  fluticasone (FLONASE) 50 MCG/ACT nasal spray Place 2 sprays into both nostrils daily.    [provider]  hydrochlorothiazide (MICROZIDE) 12.5 MG capsule TAKE 1 CAPSULE BY MOUTH ONCE A DAY FOR HTN/SWELLING 06/29/17   [provider]  isosorbide mononitrate (IMDUR) 30 MG 24 hr tablet Take 0.5 tablets (15 mg total) by mouth daily. 03/03/21 06/01/21  Verta Ellen., NP  lisinopril (PRINIVIL,ZESTRIL) 40 MG tablet Take 1 tablet (40 mg total) by mouth daily. 11/06/16   Arnoldo Lenis, MD  LORazepam (ATIVAN) 1 MG tablet Take 1 mg by mouth 3 (three) times daily as needed for anxiety.     [provider]  lubiprostone ([name]) 24 MCG capsule Take 1 capsule (24 mcg total) by mouth 2 (two) times daily with a meal. 12/03/20   Rourk, [name], MD  Naphazoline HCl (CLEAR EYES OP) Apply 1 drop to eye daily as needed (dry eyes).    [provider]  nitroGLYCERIN (NITROSTAT) 0.4 MG SL tablet Place 0.4 mg under the tongue every 5 (five) minutes as needed for chest pain. Chest Pain    [provider]  oxyCODONE-acetaminophen (PERCOCET) 5-325 MG tablet Take 1-2 tablets by mouth every 6 (six) hours as needed. 10/10/20   Veryl Speak, MD  polyethylene glycol powder (GLYCOLAX/MIRALAX) powder USE 1 CAPFUL DAILY AS NEEDED FOR CONSTIPATION 11/08/16   Annitta Needs, NP  sertraline (ZOLOFT) 50 MG tablet Take 50 mg by mouth daily.    [provider]  simvastatin  (ZOCOR) 20 MG tablet Take 20 mg by mouth daily.    [provider]  traZODone (DESYREL) 50 MG tablet Take 50 mg by mouth at bedtime. 04/09/17   [provider]  vitamin B-12 (CYANOCOBALAMIN) 1000 MCG tablet Take 1,000 mcg by mouth daily.     [provider]  zinc gluconate 50 MG tablet Take 50 mg by mouth daily.    [provider]    Allergies    Chantix [varenicline] and Tomato  Review of Systems   Review of Systems  All other systems reviewed and are negative.   Physical Exam Updated Vital Signs BP 123/61 (BP Location: Right Arm)   Pulse 69   Temp 98.6 F (37 C) (Oral)   Resp 20   Ht 5\' 6"  (1.676 m)   Wt 78 kg   SpO2 100%   BMI 27.76 kg/m   Physical Exam Vitals and nursing note reviewed. Exam conducted with a chaperone present.  Constitutional:      General: He is not in acute distress.    Appearance: Normal appearance.  HENT:     Head: Normocephalic and atraumatic.  Eyes:     General: No scleral icterus.    Conjunctiva/sclera: Conjunctivae normal.  Cardiovascular:     Rate and Rhythm: Normal rate.     Pulses: Normal pulses.  Pulmonary:     Effort: Pulmonary effort is normal. No respiratory distress.  Abdominal:     General: Abdomen is flat. There is no distension.     Palpations: Abdomen is soft. There is no mass.     Tenderness: There is abdominal tenderness. There is no right CVA tenderness, left CVA tenderness or guarding.     Comments: Soft, nondistended.  No peritoneal signs.  Generalized mild TTP.  Nothing particularly focal.  No overlying skin changes.  Hyperactive bowel sounds.  Musculoskeletal:        General: Normal range of motion.  Skin:    General: Skin is dry.  Neurological:     General: No focal deficit present.     Mental Status: He is alert and oriented to person, place, and time.     GCS: GCS eye subscore is 4. GCS verbal subscore is 5. GCS motor subscore is 6.  Psychiatric:        Mood and Affect: Mood  normal.        Behavior: Behavior normal.        Thought Content: Thought content normal.     ED Results / Procedures / Treatments   Labs (all labs ordered are listed, but only abnormal results are displayed) Labs Reviewed  CBC WITH DIFFERENTIAL/PLATELET - Abnormal; Notable for the following components:      Result Value   RBC 3.93 (*)    Hemoglobin 12.4 (*)    HCT 36.5 (*)    All other components within normal limits  COMPREHENSIVE METABOLIC PANEL - Abnormal; Notable for the following components:   Sodium 130 (*)    Chloride 96 (*)    Calcium 8.7 (*)    Total Protein 6.4 (*)    AST 13 (*)    All other components within normal limits  LIPASE, BLOOD - Abnormal; Notable for the following components:   Lipase 74 (*)    All other components within normal limits    EKG None  Radiology CT ABDOMEN PELVIS WO CONTRAST  Result Date: 03/23/2021 CLINICAL DATA:  70 year old male with abdominal pain. Concern for bowel obstruction. EXAM: CT ABDOMEN AND PELVIS WITHOUT CONTRAST TECHNIQUE: Multidetector CT imaging of the abdomen and pelvis was performed following the standard protocol without IV contrast. COMPARISON:  CT abdomen pelvis dated 03/15/2021. FINDINGS: Evaluation of this exam is limited in the absence of intravenous contrast. Lower chest: The visualized lung bases are clear. There is coronary vascular  calcification. No intra-abdominal free air or free fluid. Hepatobiliary: No focal liver abnormality is seen. No gallstones, gallbladder wall thickening, or biliary dilatation. Pancreas: Unremarkable. No pancreatic ductal dilatation or surrounding inflammatory changes. Spleen: Normal in size without focal abnormality. Adrenals/Urinary Tract: The right adrenal glands unremarkable. There is mild left adrenal thickening or nodularity. There is no hydronephrosis or nephrolithiasis on either side. There is a 15 mm left renal upper pole hypodense lesion, likely a cyst. The visualized ureters and  urinary bladder appear unremarkable. Stomach/Bowel: Diffuse thickened appearance of the gastric wall, likely related to underdistention. Gastritis or an infiltrative process is less likely but not excluded clinical correlation is recommended. There is no bowel obstruction. The appendix is normal. Vascular/Lymphatic: Moderate aortoiliac atherosclerotic disease. Dilatation of the common iliac arteries measure up to 2.4 cm on the right. The IVC is unremarkable. No portal venous gas. There is no adenopathy. Reproductive: The prostate and seminal vesicles are grossly unremarkable. Other: None Musculoskeletal: Degenerative changes of the spine. No acute osseous pathology. IMPRESSION: 1. Underdistention of the stomach versus less likely gastritis or an infiltrative process. Clinical correlation is recommended. No bowel obstruction. Normal appendix. 2. No hydronephrosis or nephrolithiasis. 3. Aortic Atherosclerosis (ICD10-I70.0). Electronically Signed   By: Anner Crete M.D.   On: 03/23/2021 18:43    Procedures Procedures   Medications Ordered in ED Medications - No data to display  ED Course  I have reviewed the triage vital signs and the nursing notes.  Pertinent labs & imaging results that were available during my care of the patient were reviewed by me and considered in my medical decision making (see chart for details).    MDM Rules/Calculators/A&P                          ROXY FILLER was evaluated in Emergency Department on 03/23/2021 for the symptoms described in the history of present illness. He was evaluated in the context of the global COVID-19 pandemic, which necessitated consideration that the patient might be at risk for infection with the SARS-CoV-2 virus that causes COVID-19. Institutional protocols and algorithms that pertain to the evaluation of patients at risk for COVID-19 are in a state of rapid change based on information released by regulatory bodies including the CDC and  federal and state organizations. These policies and algorithms were followed during the patient's care in the ED.  I personally reviewed patient's medical chart and all notes from triage and staff during today's encounter. I have also ordered and reviewed all labs and imaging that I felt to be medically necessary in the evaluation of this patient's complaints and with consideration of their physical exam. If needed, translation services were available and utilized.   Patient in the ED with complaints of constipation and generalized abdominal pain with intermittent associated nausea and emesis x4 weeks.  After brief review of his chart, it appears as though this is an acute on chronic issue.  In his PDMP it shows that he is prescribed tramadol 50 mg twice daily for chronic pain.  When asked him why he takes tramadol, he replied "I don't know".  He states that he does not understand why he takes many of his medications.  Suspect that polypharmacy is an issue that needs to be addressed by his primary care provider.  He reports that he is dissatisfied with his current PCP and is planning to establish with somebody new in [place].  Most recent colonoscopy obtained 08/2020  demonstrated diverticulosis of the entire colon, but is otherwise unremarkable.  Most recent upper endoscopy from 2020 is also reviewed and was entirely unremarkable.  There was no obvious explanation for his reported dysphagia.    CT is reviewed and without any obstruction or other acute intra-abdominal pathology.  No evidence of dilated bowel without obstruction otherwise concerning for Ogilvie's syndrome.  His potassium, calcium, and remainder of laboratory work-up is reassuring.  No evidence of tumor or stricture.  No volvulus.  He denies any rectal pain and his rectal exam is without evidence of infection.  There are no painful rectal disorders causing him reluctance to defecate.  Laboratory work-up is reviewed and entirely  unremarkable.  No expresses concern for infection.  Hemoglobin stable consistent with baseline.  Mild hyponatremia 130 has improved from baseline.  No renal or hepatic impairment.  No significant electrolyte derangement.  His wife is now at bedside.  She states that he has been following a high-fiber diet for years and that he drinks three 20 ounce bottles daily.  I encouraged him to increase his hydration.  She was concerned because his gastroenterologist mentioned that he had worsening renal function.  His serum creatinine from 9 days ago was mildly elevated at 1.3, but has improved to 0.84 here today.  CT abdomen and pelvis is reviewed and demonstrates underdistention of the stomach versus less likely a gastritis or infiltrative process.  Gastritis might possibly be contributing to his nausea and emesis symptoms.  We will add Carafate to his proton pump inhibitor regimen and encourage close outpatient follow-up with his gastroenterologist.  We will also provide Zofran given his reported nausea symptoms.  He has not seen him in 1 month and given his continually worsening symptoms, would benefit from reevaluation.  Recommending increased oral hydration and fiber intake.  Additionally encouraged regular exercise.  Suspect that this may be medication-induced in context of his twice daily tramadol.  No obvious thyromegaly on my exam, but would ask that GI obtain TSH given that patient does not regularly see primary care to rule out hypothyroidism as cause.  Nothing in his history or exam to suggest thyroid emergency.  No further work-up or intervention warranted here in the ER.  We will also provide him magnesium citrate.  Discussed case with Dr. Tomi Bamberger who agrees with assessment and plan.  ER return precautions discussed.  Final Clinical Impression(s) / ED Diagnoses Final diagnoses:  Constipation, unspecified constipation type    Rx / DC Orders ED Discharge Orders         Ordered    ondansetron  (ZOFRAN ODT) 4 MG disintegrating tablet  Every 8 hours PRN        03/23/21 1901    sucralfate (CARAFATE) 1 g tablet  3 times daily with meals        03/23/21 1901    magnesium citrate SOLN   Once        03/23/21 1901           Reita Chard 03/23/21 1904    Dorie Rank, MD 03/26/21 1321

## 2021-03-23 NOTE — Telephone Encounter (Signed)
Called pt and gave him CT results.  Pt informed me that he has been hurting in the top of his stomach.  He informed me that he has not had a BM since he last saw [place] on 4/26.  He said he has been sweating a lot for the past month now and it is from the top of his head to his feet.  Pt also stated that he is vomiting every other day and it has been going on for 2 weeks now.  Pt says that he has been following the recommendations that we made at [facility] on 02/22/2021 but no results.

## 2021-04-05 ENCOUNTER — Other Ambulatory Visit: Payer: Self-pay | Admitting: Family Medicine

## 2021-04-05 DIAGNOSIS — I739 Peripheral vascular disease, unspecified: Secondary | ICD-10-CM

## 2021-04-14 ENCOUNTER — Ambulatory Visit (INDEPENDENT_AMBULATORY_CARE_PROVIDER_SITE_OTHER): Payer: Medicare HMO

## 2021-04-14 ENCOUNTER — Other Ambulatory Visit: Payer: Self-pay

## 2021-04-14 DIAGNOSIS — R0602 Shortness of breath: Secondary | ICD-10-CM

## 2021-04-14 DIAGNOSIS — I739 Peripheral vascular disease, unspecified: Secondary | ICD-10-CM

## 2021-04-14 LAB — ECHOCARDIOGRAM COMPLETE
AR max vel: 2.16 cm2
AV Area VTI: 2.11 cm2
AV Area mean vel: 2.23 cm2
AV Mean grad: 6 mmHg
AV Peak grad: 11.6 mmHg
Ao pk vel: 1.71 m/s
Area-P 1/2: 3.63 cm2
Calc EF: 68.9 %
S' Lateral: 2.32 cm
Single Plane A2C EF: 72.5 %
Single Plane A4C EF: 64.5 %

## 2021-04-14 NOTE — Progress Notes (Addendum)
Cardiology Office Note  Date: 04/15/2021   ID: [name], DOB Apr 03, 1951, MRN [id number]  PCP:  Jani Gravel, MD  Cardiologist:  Carlyle Dolly, MD Electrophysiologist:  None   Chief Complaint: Cardiac follow-up  History of Present Illness: Trevor Berg is a 70 y.o. male with a history of HTN, COPD, esophageal dysphagia, tobacco abuse, chest pain.  Last seen by Dr. Harl Bowie 04/24/2019.  Had a long history of chest pain.  Previous cardiac catheterization 2005 for mild to moderate nonobstructive disease.  Negative stress echo 10/18/2012 with baseline LVEF 55 to 60%.  2016 Lexiscan ordered due to chest pain.  Inferior defect due to subdiaphragmatic attenuation.  No ischemia.  EGD 2018 multiple erosions of the stomach.  He was compliant with his statin medications for hyperlipidemia.  He was compliant with antihypertensive medications.  He was compliant with inhalers for COPD.   He was last here for follow-up.  He had been having some chest pain with some associated nausea and sweating recently.  Having pain in his shoulder but was not certain as to whether the heart is causing the pain or his back.  History of previous back surgery with an apparent cervical fusion.  His wife stated there was concern that the surgical hardware may be pressing on nerves in his back.  He was also complaining of increased shortness of breath worse with exertion.  He stated this seemed to have progressed over the last few months.  He complained of bilateral leg pain worse with ambulation and relieved with resting.  He continued to smoke.  Long-term history of smoking up to 3 packs/day.  He was currently smoking 1 pack/day.  He was having some issues with esophageal dysphagia.  Had an upcoming CT angio of abdomen, pelvis ordered by Dr. Gala Romney on May 17.   He is here for follow-up for recent stress test, echocardiogram and ABI.Recent stress test on 03/08/2021 was considered low risk by interpreting physician.  ABIs  were normal on 04/15/2019.  Echocardiogram on 04/14/2021 demonstrated EF of 65-70.  No WMA's, trivial MR, moderate calcification of aortic valve with moderate thickening no AR or AS.  He recently saw Dr. Gala Romney for his dysphagia.  Dr. Gala Romney ordered a CT of the abdomen.  CT of the abdomen on 03/15/2021 showed no significant proximal vessel arterial occlusive disease.  It did show aortoiliac sclerosis with 2.5 cm right and 2.3 cm left common iliac artery fusiform aneurysm.  He was referred to Dr. Sherren Mocha early vein vascular.  He has an appointment with him on May 04, 2021.  He continues to complain of what he describes as chest discomfort but points to his epigastric area when describing the pain.  He states he was having some significant issues with constipation and recently saw a new PCP who took him off of tramadol.  He states the PCP informed him this was causing his constipation.  He continues to smoke 1/2 pack/day of cigarettes.  He states he started smoking at the age of 33 and is now age 31.  We discussed being referred to pulmonary for evaluation for shortness of breath.  He denies any use of nitroglycerin and when the chest pain occurs.  He is on Imdur 30 mg p.o. daily.   Past Medical History:  Diagnosis Date   Anginal pain ([place])    Anxiety    Arthritis    both hands   Asthma    Chest pain 2005   noncritical coronary disease  in 2005: 40% distal RCA, 30% CX, 50% OM 2, EF-60%,   COPD (chronic obstructive pulmonary disease) (HCC)    Chronic bronchitis; 100-pack-year cigarette consumption   Gastroesophageal reflux disease    possible small Schatzki's ring; esophageal dilatation in 2005   H. pylori infection 2014   treated with prevpac   Hyperlipidemia    Hypertension    Overweight(278.02)    Tobacco abuse    100 pack years    Past Surgical History:  Procedure Laterality Date   CERVICAL SPINE SURGERY     C5-6 and C6-7: Relief of spinal stenosis with corpectomy, foraminotomy and fusion    COLONOSCOPY  10/19/2004   RMR:   Friable anal canal, likely source of patient's hematochezia/  Otherwise normal rectum, normal colon.   COLONOSCOPY N/A 09/09/2015   RMR: Pancolonic diverticulosis. Multiple rectal polyps and 1 polyp in the ascending colon. Pathology with hyperplastic rectal polyps and tubular adenoma colon polyp. Repeat in 2021.   COLONOSCOPY WITH PROPOFOL N/A 09/14/2020   Procedure: COLONOSCOPY WITH PROPOFOL;  Surgeon: Daneil Dolin, MD;  [facility];  Service: Endoscopy;  Laterality: N/A;  8:30am   ESOPHAGEAL DILATION N/A 09/09/2015   Procedure: ESOPHAGEAL DILATION;  Surgeon: Daneil Dolin, MD;  [facility]: [facility];  Service: Endoscopy;  Laterality: N/A;   ESOPHAGOGASTRODUODENOSCOPY  09/27/2004   RMR:   A couple of tiny, distal esophageal erosions, otherwise normal esophagus aside from a Schatzki ring status post Maloney dilation as described above/ Small hiatal hernia, otherwise normal stomach, normal D1-D2   ESOPHAGOGASTRODUODENOSCOPY N/A 09/09/2015   Procedure: ESOPHAGOGASTRODUODENOSCOPY (EGD);  Surgeon: Daneil Dolin, MD;  [facility];  Service: Endoscopy;  Laterality: N/A;   ESOPHAGOGASTRODUODENOSCOPY N/A 04/19/2017   RMR: Normal esophagus. Dilated. Erosive gastropathy. Biopsied. Otherwise normal. Pathology with mild chronic gastritis. No H. pylori   ESOPHAGOGASTRODUODENOSCOPY (EGD) WITH ESOPHAGEAL DILATION N/A 03/19/2013   UXN:ATFTDD esophagus s/p dilation/Abnormal stomach of uncertain clinical significance-s/p bx positive for H. pylori, status post Prevpac treatment   ESOPHAGOGASTRODUODENOSCOPY (EGD) WITH PROPOFOL N/A 10/02/2019   Procedure: ESOPHAGOGASTRODUODENOSCOPY (EGD) WITH PROPOFOL;  Surgeon: Daneil Dolin, MD;  [facility];  Service: Endoscopy;  Laterality: N/A;  8:30am   KNEE ARTHROSCOPY WITH MEDIAL MENISECTOMY Right 12/06/2017   Procedure: RIGHT KNEE ARTHROSCOPY WITH PARTIAL MEDIAL MENISECTOMY; REMOVAL OF LOOSE BODY;   Surgeon: Carole Civil, MD;  Location: [facility];  Service: Orthopedics;  Laterality: Right;   MALONEY DILATION N/A 04/19/2017   Procedure: Venia Minks DILATION;  Surgeon: Daneil Dolin, MD;  [facility];  Service: Endoscopy;  Laterality: N/A;   MALONEY DILATION N/A 10/02/2019   Procedure: Venia Minks DILATION;  Surgeon: Daneil Dolin, MD;  [facility];  Service: Endoscopy;  Laterality: N/A;   ORIF ANKLE FRACTURE     right   ORIF TIBIA FRACTURE     Left   TONSILLECTOMY      Current Outpatient Medications  Medication Sig Dispense Refill   albuterol (PROVENTIL HFA;VENTOLIN HFA) 108 (90 BASE) MCG/ACT inhaler Inhale 2 puffs into the lungs every 6 (six) hours as needed for shortness of breath.      amLODipine (NORVASC) 2.5 MG tablet Take 2.5 mg by mouth daily.     ANORO ELLIPTA 62.5-25 MCG/INH AEPB Inhale 1 puff into the lungs daily.      aspirin EC 81 MG tablet Take 81 mg by mouth daily.     atenolol (TENORMIN) 50 MG tablet TAKE ONE TABLET BY MOUTH  ONCE DAILY IN THE MORNING 30 tablet 3   cetirizine (ZYRTEC) 10 MG tablet Take 10 mg by mouth daily.     dextromethorphan-guaiFENesin (MUCINEX DM) 30-600 MG 12hr tablet Take 1 tablet by mouth 2 (two) times daily.     Ergocalciferol (VITAMIN D2) 50 MCG (2000 UT) TABS Take 1 tablet by mouth daily.     esomeprazole (NEXIUM) 40 MG capsule Take 1 capsule (40 mg total) by mouth 2 (two) times daily before a meal. 60 capsule 11   fluticasone (FLONASE) 50 MCG/ACT nasal spray Place 2 sprays into both nostrils daily.     hydrochlorothiazide (MICROZIDE) 12.5 MG capsule TAKE 1 CAPSULE BY MOUTH ONCE A DAY FOR HTN/SWELLING  3   isosorbide mononitrate (IMDUR) 30 MG 24 hr tablet Take 0.5 tablets (15 mg total) by mouth daily. 15 tablet 6   lisinopril (PRINIVIL,ZESTRIL) 40 MG tablet Take 1 tablet (40 mg total) by mouth daily. 90 tablet 3   LORazepam (ATIVAN) 1 MG tablet Take 1 mg by mouth 3 (three) times daily as needed for anxiety.       lubiprostone ([name]) 24 MCG capsule Take 1 capsule (24 mcg total) by mouth 2 (two) times daily with a meal. 60 capsule 11   Naphazoline HCl (CLEAR EYES OP) Apply 1 drop to eye daily as needed (dry eyes).     nitroGLYCERIN (NITROSTAT) 0.4 MG SL tablet Place 0.4 mg under the tongue every 5 (five) minutes as needed for chest pain. Chest Pain     ondansetron (ZOFRAN ODT) 4 MG disintegrating tablet Take 1 tablet (4 mg total) by mouth every 8 (eight) hours as needed for nausea or vomiting. 20 tablet 0   oxyCODONE-acetaminophen (PERCOCET) 5-325 MG tablet Take 1-2 tablets by mouth every 6 (six) hours as needed. 12 tablet 0   polyethylene glycol powder (GLYCOLAX/MIRALAX) powder USE 1 CAPFUL DAILY AS NEEDED FOR CONSTIPATION 527 g 11   sertraline (ZOLOFT) 50 MG tablet Take 50 mg by mouth daily.     simvastatin (ZOCOR) 20 MG tablet Take 20 mg by mouth daily.     sucralfate (CARAFATE) 1 g tablet Take 1 tablet (1 g total) by mouth 3 (three) times daily with meals for 10 days. 30 tablet 0   tiZANidine (ZANAFLEX) 2 MG tablet Take 2 mg by mouth every 8 (eight) hours as needed.     traZODone (DESYREL) 50 MG tablet Take 50 mg by mouth at bedtime.  0   VASCEPA 1 g capsule Take 2 g by mouth 2 (two) times daily.     vitamin B-12 (CYANOCOBALAMIN) 1000 MCG tablet Take 1,000 mcg by mouth daily.      zinc gluconate 50 MG tablet Take 50 mg by mouth daily.     No current facility-administered medications for this visit.   Allergies:  Chantix [varenicline] and Tomato   Social History: The patient  reports that he has been smoking cigarettes. He has a 75.00 pack-year smoking history. He has never used smokeless tobacco. He reports that he does not drink alcohol and does not use drugs.   Family History: The patient's family history includes Diabetes in his brother; Heart attack in his mother; Hypertension in an other family member; Ulcers in his brother.   ROS:  Please see the history of present illness. Otherwise,  complete review of systems is positive for none.  All other systems are reviewed and negative.   Physical Exam: VS:  BP 118/60   Pulse 70   Ht 5' 6.25" (  1.683 m)   Wt 172 lb 6.4 oz (78.2 kg)   SpO2 97%   BMI 27.62 kg/m , BMI Body mass index is 27.62 kg/m.  Wt Readings from Last 3 Encounters:  04/15/21 172 lb 6.4 oz (78.2 kg)  03/23/21 172 lb (78 kg)  03/03/21 172 lb (78 kg)    General: Patient appears comfortable at rest. Neck: Supple, no elevated JVP or carotid bruits, no thyromegaly. Lungs: Clear to auscultation, nonlabored breathing at rest. Cardiac: Regular rate and rhythm, no S3 or significant systolic murmur, no pericardial rub. Extremities: No pitting edema, distal pulses 2+. Skin: Warm and dry. Musculoskeletal: No kyphosis. Neuropsychiatric: Alert and oriented x3, affect grossly appropriate.  ECG:  EKG 10/10/2020 sinus rhythm with a rate of 77.  Recent Labwork: 03/23/2021: ALT 12; AST 13; BUN 16; Creatinine, Ser 0.86; Hemoglobin 12.4; Platelets 187; Potassium 4.0; Sodium 130  No results found for: CHOL, TRIG, HDL, CHOLHDL, VLDL, LDLCALC, LDLDIRECT  Other Studies Reviewed Today:  Stress Myoview 03/08/2021 Study Result  Narrative & Impression  No diagnostic ST segment changes to indicate ischemia. Small, moderate intensity, apical to basal inferior defect that is fixed and suggestive of scar although normal wall motion suggests possibility of diaphragmatic attenuation as well. No definitive ischemic territories are noted. This is a low risk study. Nuclear stress EF: 71%.     CTA abdomen Pelvis 03/15/2021 IMPRESSION: 1. No acute findings. No significant proximal visceral arterial occlusive disease. 2. Aortoiliac atherosclerosis (ICD10-170.0) with 2.5 cm right and 2.3 cm left common iliac artery fusiform aneurysms. Consider vascular specialist consultation.    ABI 04/14/2021 Summary:  Right: Resting right ankle-brachial index indicates noncompressible right   lower extremity arteries. The right toe-brachial index is normal.   Left: Resting left ankle-brachial index indicates noncompressible left  lower extremity arteries. The left toe-brachial index is normal.     Echocardiogram 04/14/2021  1. Left ventricular ejection fraction, by estimation, is 65 to 70%. The left ventricle has normal function. The left ventricle has no regional wall motion abnormalities. Left ventricular diastolic parameters were normal. The average left ventricular global longitudinal strain is - 20.1 %. The global longitudinal strain is normal. 2. Right ventricular systolic function is normal. The right ventricular size is normal. 3. The mitral valve is normal in structure. Trivial mitral valve regurgitation. No evidence of mitral stenosis. 4. The aortic valve has an indeterminant number of cusps. There is moderate calcification of the aortic valve. There is moderate thickening of the aortic valve. Aortic valve regurgitation is not visualized. No aortic stenosis is present. 5. The inferior vena cava is normal in size with greater than 50% respiratory variability, suggesting right atrial pressure of 3 mmHg. Comparison(s): Stress echocr=ardiogram done 10/17/12 showed an EF of 70%.   Diagnostic Studies 07/2004 Cath The left main is a large vessel with no significant disease.    The LAD is a medium size vessel which courses three quarters of the way down  the anterior wall and has no significant disease.  There are two small  diagonal branches with no significant disease.    The left circumflex is a large vessel coursing through the AV groove.  The  third obtuse marginal branch is free of the AV groove circumflex and noted  to have a mild 30% narrowing after the takeoff the second OM.    The first OM is a small vessel which bifurcates in the proximal portion with  no significant disease.    The second OM is a  large vessel which bifurcates distally and has 50% mid   vessel stenosis.    The third OM is a small vessel with no significant disease.    The right coronary artery is a large vessel which is dominant and gives rise  to both the PDA as well as the posterolateral branch.  The RCA is ectatic  but has no high grade stenosis.    The PDA is a medium size vessel with no significant disease.    The PLA is a large vessel which bifurcates in the mid segment. There is 40%  lesion in the proximal portion fo the lower bifurcation.    Left ventriculogram reveals a preserved ejection fraction of 60%.    Hemodynamics:  The systemic arterial pressure was 112/69.  Left ventricular  pressure was 115/8.  Left ventricular end diastolic pressure was 13.    CONCLUSION:  1.  Non-critical coronary artery disease.  2.  Normal left ventricular systolic function.     09/2012 Stress echo Study Conclusions  - Stress: There was resting hypertension with a hypertensive   response to stress. Atypical chest pain was present before   and during exercise without increase in intensity, and it   persisted following conclusion of the test. - Stress ECG conclusions: No diagnostic ST abnormalities or   arrhythmias. The stress ECG was negative for ischemia.   [facility] scoring: exercise time of 56min; maximum ST deviation   of 71mm; angina present but did not limit exercise;   resulting score is -1. This score predicts a moderate risk   of cardiac events. - Staged echo: There was no echocardiographic evidence for   stress-induced ischemia. - Peak stress: LV global systolic function was vigorous. The   estimated LV ejection fraction was 70% to 75%. No evidence   for new LV regional wall motion abnormalities.     07/2015 Nuclear stress test There was no ST segment deviation noted during stress. Defect 1: There is a medium defect of mild severity present in the basal inferior, mid inferior and apical inferior location, which is likely due to soft tissue attenuation artifact. A  mild degree of myocardial scar cannot entirely be ruled out. This is a low risk study. Nuclear stress EF: 62%.   Assessment and Plan:  1. Essential hypertension   2. Mixed hyperlipidemia   3. Chest pain, unspecified type   4. Shortness of breath   5. Bilateral leg pain   6. Tobacco abuse   7. Bilateral iliac artery aneurysm (HCC)     1. Essential hypertension Blood pressure well controlled on current therapy.  BP today 118/60.  Continue amlodipine 2.5 mg daily.  Continue atenolol 50 mg p.o. daily.  Continue HCTZ 12.5 mg p.o. daily.  Continue lisinopril 40 mg p.o. daily.  2. Mixed hyperlipidemia Continue simvastatin 20 mg p.o. daily.  3. Chest pain, unspecified type He continues to complain of chest pain but describes the chest pain as being located in his abdomen and upper abdomen and mid epigastric area without radiation or associated nausea, vomiting or diaphoresis.  Recent stress test was considered low risk Imdur 15 mg daily.  Continue sublingual nitroglycerin as needed.  Continue aspirin 81 mg daily.  4. Shortness of breath Complaining of increasing shortness of breath worse with exertion.  States it seems to have progressed over the last few months.  Recent echocardiogram on 04/14/2021 EF of 65 to 70%.  No WMA's.  Trivial MR, moderate calcification of the aortic valve with moderate  thickening.  No regurgitation or stenosis.  Please refer to pulmonary for continued complaints of shortness of breath and significant history of smoking to either Dr. Elsworth Soho or Dr. Melvyn Novas.  5. Bilateral leg pain Complaining of bilateral calf and thigh pain when ambulating and relieved at rest.  Recent ABI studies were normal on 04/14/2021  6. Tobacco abuse Long history of smoking up to 3 packs/day for many years.  States he is down to 1 pack/day now.  Highly encouraged cessation.  Continued reinforcement to stop smoking.  7.  Bilateral iliac artery aneurysms Had recently discovered bilateral iliac artery  aneurysms noted on CT of abdomen ordered by Dr. Gala Romney for abdominal discomfort.  He has been referred to Dr. Donnetta Hutching [facility] with upcoming appointment on July 6.  Medication Adjustments/Labs and Tests Ordered: Current medicines are reviewed at length with the patient today.  Concerns regarding medicines are outlined above.   Disposition: Follow-up with Dr. Harl Bowie or [facility] 6 months  Signed, Levell July, NP 04/15/2021 2:04 PM    [facility] [place], [place], [place] [address] Phone: [phone]; Fax: [phone]

## 2021-04-15 ENCOUNTER — Encounter: Payer: Self-pay | Admitting: Family Medicine

## 2021-04-15 ENCOUNTER — Ambulatory Visit (INDEPENDENT_AMBULATORY_CARE_PROVIDER_SITE_OTHER): Payer: Medicare HMO | Admitting: Family Medicine

## 2021-04-15 VITALS — BP 118/60 | HR 70 | Ht 66.25 in | Wt 172.4 lb

## 2021-04-15 DIAGNOSIS — R0602 Shortness of breath: Secondary | ICD-10-CM

## 2021-04-15 DIAGNOSIS — R079 Chest pain, unspecified: Secondary | ICD-10-CM

## 2021-04-15 DIAGNOSIS — E782 Mixed hyperlipidemia: Secondary | ICD-10-CM | POA: Diagnosis not present

## 2021-04-15 DIAGNOSIS — I10 Essential (primary) hypertension: Secondary | ICD-10-CM

## 2021-04-15 DIAGNOSIS — M79604 Pain in right leg: Secondary | ICD-10-CM

## 2021-04-15 DIAGNOSIS — M79605 Pain in left leg: Secondary | ICD-10-CM

## 2021-04-15 DIAGNOSIS — I723 Aneurysm of iliac artery: Secondary | ICD-10-CM

## 2021-04-15 DIAGNOSIS — Z720 Tobacco use: Secondary | ICD-10-CM

## 2021-04-15 NOTE — Patient Instructions (Signed)
Medication Instructions:  Continue all current medications.  Labwork: none  Testing/Procedures: none  Follow-Up: 6 months   Any Other Special Instructions Will Be Listed Below (If Applicable). You have been referred to Pulmonology    If you need a refill on your cardiac medications before your next appointment, please call your pharmacy.  

## 2021-04-28 ENCOUNTER — Other Ambulatory Visit: Payer: Self-pay | Admitting: Internal Medicine

## 2021-05-04 ENCOUNTER — Encounter: Payer: Medicare HMO | Admitting: Vascular Surgery

## 2021-05-10 ENCOUNTER — Other Ambulatory Visit: Payer: Self-pay | Admitting: Internal Medicine

## 2021-05-11 ENCOUNTER — Other Ambulatory Visit: Payer: Self-pay | Admitting: Family Medicine

## 2021-05-13 ENCOUNTER — Encounter: Payer: Self-pay | Admitting: Internal Medicine

## 2021-05-13 ENCOUNTER — Institutional Professional Consult (permissible substitution): Payer: Medicare HMO | Admitting: Internal Medicine

## 2021-05-13 ENCOUNTER — Ambulatory Visit (HOSPITAL_COMMUNITY)
Admission: RE | Admit: 2021-05-13 | Discharge: 2021-05-13 | Disposition: A | Discharge status: Home / Self Care | Payer: Medicare HMO | Source: Ambulatory Visit | Attending: Internal Medicine | Admitting: Internal Medicine

## 2021-05-13 ENCOUNTER — Other Ambulatory Visit: Payer: Self-pay

## 2021-05-13 ENCOUNTER — Ambulatory Visit: Payer: Medicare HMO | Admitting: Internal Medicine

## 2021-05-13 VITALS — BP 126/74 | HR 80 | Temp 97.0°F | Ht 66.75 in | Wt 173.0 lb

## 2021-05-13 DIAGNOSIS — R079 Chest pain, unspecified: Secondary | ICD-10-CM

## 2021-05-13 DIAGNOSIS — J449 Chronic obstructive pulmonary disease, unspecified: Secondary | ICD-10-CM | POA: Insufficient documentation

## 2021-05-13 DIAGNOSIS — F17210 Nicotine dependence, cigarettes, uncomplicated: Secondary | ICD-10-CM

## 2021-05-13 DIAGNOSIS — I10 Essential (primary) hypertension: Secondary | ICD-10-CM | POA: Diagnosis not present

## 2021-05-13 MED ORDER — ANORO ELLIPTA 62.5-25 MCG/INH IN AEPB: 1.0000 | INHALATION_SPRAY | Freq: Every day | RESPIRATORY_TRACT | 0 refills | Status: AC

## 2021-05-13 MED ORDER — OLMESARTAN MEDOXOMIL 40 MG PO TABS: 40.0000 mg | ORAL_TABLET | Freq: Every day | ORAL | 11 refills | Status: DC

## 2021-05-13 NOTE — Assessment & Plan Note (Signed)
Active smoker - 05/13/2021  After extensive coaching inhaler device,  effectiveness =    90% with elipta > continue anoro   Pt is Group B in terms of symptom/risk and laba/lama therefore appropriate rx at this point >>>  Continue anoro for another week then try off to see what difference it makes "like high octane fuel"  Or whether notes increased need for saba and if so continue pending f/u pfts   Re saba: Try albuterol 15 min before an activity (on alternating days)  that you know would make you short of breath and see if it makes any difference and if makes none then don't take albuterol after activity unless you can't catch your breath as this means it's the resting that helps, not the albuterol.

## 2021-05-13 NOTE — Patient Instructions (Addendum)
Stop lisinopril and your cough should improve over the next week or two  Start olmesartan 40 mg one daily in place of lisinopril   Continue anoro once daily 1st thing in am  -  try off after a week   Only use your albuterol as a rescue medication to be used if you can't catch your breath by resting or doing a relaxed purse lip breathing pattern.  - The less you use it, the better it will work when you need it. - Ok to use up to 2 puffs  every 4 hours if you must but call for immediate appointment if use goes up over your usual need - Don't leave home without it !!  (think of it like starter fluid or a  spare tire for your car)   Also ok to Try albuterol 15 min before an activity (on alternating days)  that you know would make you short of breath and see if it makes any difference and if makes none then don't take albuterol after activity unless you can't catch your breath as this means it's the resting that helps, not the albuterol.    Pain pattern suggests ibs:  very limited distribution of pain locations, daytime, not usually exacerbated by exercise  worse in sitting position, frequently associated with generalized abd bloating, not as likely to be present supine due to the dome effect of the diaphragm which  is  canceled in that position. Frequently these patients have had multiple negative GI workups and CT scans.  Treatment consists of avoiding foods that cause gas (especially boiled eggs, mexcican food but especially  beans and undercooked vegetables like  spinach and some salads)  and citrucel 1 heaping tsp twice daily with a large glass of water.     The key is to stop smoking completely before smoking completely stops you!   Please schedule a follow up office visit in 6 weeks   with all medications /inhalers/ solutions in hand so we can verify exactly what you are taking. This includes all medications from all doctors and over the counters  - PFTs on return

## 2021-05-13 NOTE — Progress Notes (Signed)
Trevor Berg, male    DOB: 1951/03/13    MRN: [id number]   Brief patient profile:  80 yowm active smoker with onset ? 2018 doe better on anoro referred to pulmonary clinic in [place]  05/13/2021 by Dr Sydell Axon with persistent cough to point of persistent lower chest and upper abd pain    History of Present Illness  05/13/2021  Pulmonary/ 1st Berg eval/ Trevor Berg / Trevor Berg  Chief Complaint  Patient presents with   Pulmonary Consult    Referred by Dr Gala Romney. Pt c/o cough "for years"- occ prod with clear sputum.    Dyspnea:  says now limited by knee / steps are tough  Cough: clear mucus  Sleep: L side 2 pillows best  SABA use: 3-4 x per day  Smoking down to 1/2 ppd  Sometimes get choked on food Lower Cp x 2 years daily in sitting postion usually  R > L absent in certain positions supine, not reproduced with exertion Neg gi w/u   No obvious day to day or daytime variability or assoc excess/ purulent sputum or mucus plugs or hemoptysis or  subjective wheeze or overt sinus or hb symptoms.   Sleeping  without nocturnal  or early am exacerbation  of respiratory  c/o's or need for noct saba. Also denies any obvious fluctuation of symptoms with weather or environmental changes or other aggravating or alleviating factors except as outlined above   No unusual exposure hx or h/o childhood pna/ asthma or knowledge of premature birth.  Current Allergies, Complete Past Medical History, Past Surgical History, Family History, and Social History were reviewed in [employer] record.  ROS  The following are not active complaints unless bolded Hoarseness, sore throat, dysphagia, dental problems, itching, sneezing,  nasal congestion or discharge of excess mucus or purulent secretions, ear ache,   fever, chills, sweats, unintended wt loss or wt gain, classically pleuritic or exertional cp,  orthopnea pnd or arm/hand swelling  or leg swelling, presyncope, palpitations, abdominal  pain, anorexia, nausea, vomiting, diarrhea  or change in bowel habits or change in bladder habits, change in stools or change in urine, dysuria, hematuria,  rash, arthralgias, visual complaints, headache, numbness, weakness or ataxia or problems with walking or coordination,  change in mood or  memory.              Past Medical History:  Diagnosis Date   Anginal pain ([place])    Anxiety    Arthritis    both hands   Asthma    Chest pain 2005   noncritical coronary disease in 2005: 40% distal RCA, 30% CX, 50% OM 2, EF-60%,   COPD (chronic obstructive pulmonary disease) (HCC)    Chronic bronchitis; 100-pack-year cigarette consumption   Gastroesophageal reflux disease    possible small Schatzki's ring; esophageal dilatation in 2005   H. pylori infection 2014   treated with prevpac   Hyperlipidemia    Hypertension    Overweight(278.02)    Tobacco abuse    100 pack years    Outpatient Medications Prior to Visit  Medication Sig Dispense Refill   albuterol (PROVENTIL HFA;VENTOLIN HFA) 108 (90 BASE) MCG/ACT inhaler Inhale 2 puffs into the lungs every 6 (six) hours as needed for shortness of breath.      amLODipine (NORVASC) 2.5 MG tablet Take 2.5 mg by mouth daily.     ANORO ELLIPTA 62.5-25 MCG/INH AEPB Inhale 1 puff into the lungs daily.      aspirin  EC 81 MG tablet Take 81 mg by mouth daily.     atenolol (TENORMIN) 50 MG tablet TAKE ONE TABLET BY MOUTH ONCE DAILY IN THE MORNING 30 tablet 3   cetirizine (ZYRTEC) 10 MG tablet Take 10 mg by mouth daily.     dextromethorphan-guaiFENesin (MUCINEX DM) 30-600 MG 12hr tablet Take 1 tablet by mouth 2 (two) times daily.     Ergocalciferol (VITAMIN D2) 50 MCG (2000 UT) TABS Take 1 tablet by mouth daily.     esomeprazole (NEXIUM) 40 MG capsule Take 1 capsule (40 mg total) by mouth 2 (two) times daily before a meal. 60 capsule 11   fluticasone (FLONASE) 50 MCG/ACT nasal spray Place 2 sprays into both nostrils daily.     hydrochlorothiazide  (MICROZIDE) 12.5 MG capsule TAKE 1 CAPSULE BY MOUTH ONCE A DAY FOR HTN/SWELLING  3   isosorbide mononitrate (IMDUR) 30 MG 24 hr tablet Take 0.5 tablets (15 mg total) by mouth daily. 15 tablet 6   lisinopril (PRINIVIL,ZESTRIL) 40 MG tablet Take 1 tablet (40 mg total) by mouth daily. 90 tablet 3   LORazepam (ATIVAN) 1 MG tablet TAKE 1 TABLET BY MOUTH 3 TIMES A DAY AS NEEDED 30 tablet 0   lubiprostone ([name]) 24 MCG capsule Take 1 capsule (24 mcg total) by mouth 2 (two) times daily with a meal. 60 capsule 11   Naphazoline HCl (CLEAR EYES OP) Apply 1 drop to eye daily as needed (dry eyes).     nitroGLYCERIN (NITROSTAT) 0.4 MG SL tablet Place 0.4 mg under the tongue every 5 (five) minutes as needed for chest pain. Chest Pain     ondansetron (ZOFRAN ODT) 4 MG disintegrating tablet Take 1 tablet (4 mg total) by mouth every 8 (eight) hours as needed for nausea or vomiting. 20 tablet 0   polyethylene glycol powder (GLYCOLAX/MIRALAX) powder USE 1 CAPFUL DAILY AS NEEDED FOR CONSTIPATION 527 g 11   sertraline (ZOLOFT) 50 MG tablet Take 50 mg by mouth daily.     simvastatin (ZOCOR) 20 MG tablet Take 20 mg by mouth daily.     tiZANidine (ZANAFLEX) 2 MG tablet Take 2 mg by mouth every 8 (eight) hours as needed.     traZODone (DESYREL) 50 MG tablet Take 50 mg by mouth at bedtime.  0   VASCEPA 1 g capsule Take 2 g by mouth 2 (two) times daily.     vitamin B-12 (CYANOCOBALAMIN) 1000 MCG tablet Take 1,000 mcg by mouth daily.      zinc gluconate 50 MG tablet Take 50 mg by mouth daily.     oxyCODONE-acetaminophen (PERCOCET) 5-325 MG tablet Take 1-2 tablets by mouth every 6 (six) hours as needed. 12 tablet 0   sucralfate (CARAFATE) 1 g tablet Take 1 tablet (1 g total) by mouth 3 (three) times daily with meals for 10 days. 30 tablet 0   No facility-administered medications prior to visit.     Objective:     BP 126/74 (BP Location: Left Arm, Cuff Size: Normal)   Pulse 80   Temp (!) 97 F (36.1 C) (Oral)   Ht  5' 6.75" (1.695 m)   Wt 173 lb (78.5 kg)   SpO2 99% Comment: on RA  BMI 27.30 kg/m   SpO2: 99 % (on RA)  Amb wm gruff voice  HEENT : pt wearing mask not removed for exam due to covid - 19 concerns.   NECK :  without JVD/Nodes/TM/ nl carotid upstrokes bilaterally   LUNGS: no acc  muscle use,  Min barrel  contour chest wall with bilateral  slightly decreased bs s audible wheeze and  without cough on insp or exp maneuvers and min  Hyperresonant  to  percussion bilaterally     CV:  RRR  no s3 or murmur or increase in P2, and no edema   ABD:  soft and nontender with pos end  insp Hoover's  in the supine position. No bruits or organomegaly appreciated, bowel sounds nl  MS:   Nl gait/  ext warm without deformities, calf tenderness, cyanosis or clubbing No obvious joint restrictions   SKIN: warm and dry without lesions    NEURO:  alert, approp, nl sensorium with  no motor or cerebellar deficits apparent.        CXR PA and Lateral:   05/13/2021 :    I personally reviewed images  / impression as follows:    Mild copd      Assessment   COPD GOLD ?   Active smoker - 05/13/2021  After extensive coaching inhaler device,  effectiveness =    90% with elipta > continue anoro   Pt is Group B in terms of symptom/risk and laba/lama therefore appropriate rx at this point >>>  Continue anoro for another week then try off to see what difference it makes "like high octane fuel"  Or whether notes increased need for saba and if so continue pending f/u pfts   Re saba: Try albuterol 15 min before an activity (on alternating days)  that you know would make you short of breath and see if it makes any difference and if makes none then don't take albuterol after activity unless you can't catch your breath as this means it's the resting that helps, not the albuterol.       Cigarette smoker Counseled re importance of smoking cessation but did not meet time criteria for separate billing      Chest  pain noncritical coronary disease in 2005: 40% distal RCA, 30% CX, 50% OM 2, EF-60%,  Neg gi w/u as well and my hx suggests Classic subdiaphragmatic pain pattern suggests ibs:  Stereotypical, migratory with a very limited distribution of pain locations, daytime, not usually exacerbated by exercise  or coughing, worse in sitting position, frequently associated with generalized abd bloating, not as likely to be present supine due to the dome effect of the diaphragm which  is  canceled in that position. Frequently these patients have had multiple negative GI workups and CT scans.  Treatment consists of avoiding foods that cause gas (especially boiled eggs, mexcican food but especially  beans and undercooked vegetables like  spinach and some salads)  and citrucel 1 heaping tsp twice daily with a large glass of water.  Pain should improve w/in 2 weeks    Essential hypertension  - Try off acei 05/13/2021 for pseuodcopd / choking sensations   In the best review of chronic cough to date ( NEJM 2016 375 1572-6203) ,  ACEi are now felt to cause cough in up to  20% of pts which is a 4 fold increase from previous reports and does not include the variety of non-specific complaints we see in pulmonary clinic in pts on ACEi but previously attributed to another dx like  Copd/asthma and  include PNDS, throat and chest congestion, "bronchitis", unexplained dyspnea and noct "strangling" or choking sensations, and hoarseness, but also  atypical /refractory GERD symptoms like dysphagia and "bad heartburn"   The only way I know  to prove this is not an "ACEi Case" is a trial off ACEi x a minimum of 6 weeks then regroup.   >>> try olmesartan 40 mg daily, return in 6 weeks for recheck with pfts    Each maintenance medication was reviewed in detail including emphasizing most importantly the difference between maintenance and prns and under what circumstances the prns are to be triggered using an action plan format where  appropriate.  Total time for H and P, chart review, counseling, reviewing elipta  device(s) and generating customized AVS unique to this Berg visit / same day charting  > 60 min           Christinia Gully, MD 05/13/2021

## 2021-05-13 NOTE — Assessment & Plan Note (Signed)
Try off acei 05/13/2021 for pseuodcopd / choking sensations   In the best review of chronic cough to date ( NEJM 2016 375 8590-9311) ,  ACEi are now felt to cause cough in up to  20% of pts which is a 4 fold increase from previous reports and does not include the variety of non-specific complaints we see in pulmonary clinic in pts on ACEi but previously attributed to another dx like  Copd/asthma and  include PNDS, throat and chest congestion, "bronchitis", unexplained dyspnea and noct "strangling" or choking sensations, and hoarseness, but also  atypical /refractory GERD symptoms like dysphagia and "bad heartburn"   The only way I know  to prove this is not an "ACEi Case" is a trial off ACEi x a minimum of 6 weeks then regroup.   >>> try olmesartan 40 mg daily, return in 6 weeks for recheck with pfts

## 2021-05-13 NOTE — Assessment & Plan Note (Signed)
noncritical coronary disease in 2005: 40% distal RCA, 30% CX, 50% OM 2, EF-60%,  Neg gi w/u as well and my hx suggests Classic subdiaphragmatic pain pattern suggests ibs:  Stereotypical, migratory with a very limited distribution of pain locations, daytime, not usually exacerbated by exercise  or coughing, worse in sitting position, frequently associated with generalized abd bloating, not as likely to be present supine due to the dome effect of the diaphragm which  is  canceled in that position. Frequently these patients have had multiple negative GI workups and CT scans.  Treatment consists of avoiding foods that cause gas (especially boiled eggs, mexcican food but especially  beans and undercooked vegetables like  spinach and some salads)  and citrucel 1 heaping tsp twice daily with a large glass of water.  Pain should improve w/in 2 weeks           Each maintenance medication was reviewed in detail including emphasizing most importantly the difference between maintenance and prns and under what circumstances the prns are to be triggered using an action plan format where appropriate.  Total time for H and P, chart review, counseling, reviewing elipta  device(s) and generating customized AVS unique to this office visit / same day charting  > 60 min

## 2021-05-13 NOTE — Assessment & Plan Note (Signed)
Counseled re importance of smoking cessation but did not meet time criteria for separate billing   °

## 2021-06-09 ENCOUNTER — Encounter: Payer: Self-pay | Admitting: Internal Medicine

## 2021-06-29 ENCOUNTER — Ambulatory Visit (INDEPENDENT_AMBULATORY_CARE_PROVIDER_SITE_OTHER): Payer: Medicare HMO | Admitting: Internal Medicine

## 2021-06-29 ENCOUNTER — Other Ambulatory Visit: Payer: Self-pay

## 2021-06-29 ENCOUNTER — Encounter: Payer: Self-pay | Admitting: Internal Medicine

## 2021-06-29 VITALS — BP 132/74 | HR 70 | Temp 97.8°F | Ht 66.0 in | Wt 174.0 lb

## 2021-06-29 DIAGNOSIS — J449 Chronic obstructive pulmonary disease, unspecified: Secondary | ICD-10-CM

## 2021-06-29 DIAGNOSIS — F17210 Nicotine dependence, cigarettes, uncomplicated: Secondary | ICD-10-CM | POA: Diagnosis not present

## 2021-06-29 DIAGNOSIS — I10 Essential (primary) hypertension: Secondary | ICD-10-CM

## 2021-06-29 MED ORDER — ANORO ELLIPTA 62.5-25 MCG/INH IN AEPB: 1.0000 | INHALATION_SPRAY | Freq: Every day | RESPIRATORY_TRACT | 0 refills | Status: DC

## 2021-06-29 NOTE — Patient Instructions (Signed)
We will set you up for next available Pulmonary function tests and I will call you the results to see if follow up here.  The key is to stop smoking completely before smoking completely stops you!    If you are satisfied with your treatment plan,  let your doctor know and he/she can either refill your medications or you can return here when your prescription runs out.     If in any way you are not 100% satisfied,  please tell us.  If 100% better, tell your friends!  Pulmonary follow up is as needed

## 2021-06-29 NOTE — Progress Notes (Addendum)
Trevor Berg, male    DOB: April 14, 1951    MRN: [id number]   Brief patient profile:  25 yowm active smoker with onset ? 2018 doe better on anoro referred to pulmonary clinic in [place]  05/13/2021 by Dr Sydell Axon with persistent cough to point of persistent lower chest and upper abd pain    History of Present Illness  05/13/2021  Pulmonary/ 1st office eval/ Kalyssa Anker / [facility]  Chief Complaint  Patient presents with   Pulmonary Consult    Referred by Dr Gala Romney. Pt c/o cough "for years"- occ prod with clear sputum.    Dyspnea:  says now limited by knee / steps are tough  Cough: clear mucus  Sleep: L side 2 pillows best  SABA use: 3-4 x per day  Smoking down to 1/2 ppd  Sometimes get choked on food Lower Cp x 2 years daily in sitting postion usually  R > L absent in certain positions supine, not reproduced with exertion Neg gi w/u  Rec Stop lisinopril and your cough should improve over the next week or two Start olmesartan 40 mg one daily in place of lisinopril  Continue anoro once daily 1st thing in am  -  try off after a week  Only use your albuterol as a rescue medication Also ok to Try albuterol 15 min before an activity breath as this means it's the resting that helps, not the albuterol. Pain pattern suggests ibs The key is to stop smoking completely before smoking completely stops you! Please schedule a follow up office visit in 6 weeks   with all medications /inhalers/ solutions in hand so we can verify exactly what you are taking. This includes all medications from all doctors and over the counters  - PFTs on return> did not do        06/29/2021  f/u ov/[place] [facility]/Azam Gervasi re: GOLD ? / ? Acei case /  ? IBS /   No chief complaint on file.  Dyspnea:  flee market x 30 min / limited by knees from step climbing or walking at fast speed  Cough: improved/ nasal tone/ drainage worse summer  Sleeping: flat surface 2 pillows  SABA use: using more since stopped anoro  02: none   Covid status: vax x 0  Lung cancer screening:     No obvious day to day or daytime variability or assoc excess/ purulent sputum or mucus plugs or hemoptysis or cp or chest tightness, subjective wheeze or overt sinus or hb symptoms.   Sleeping  without nocturnal  or early am exacerbation  of respiratory  c/o's or need for noct saba. Also denies any obvious fluctuation of symptoms with weather or environmental changes or other aggravating or alleviating factors except as outlined above   No unusual exposure hx or h/o childhood pna/ asthma or knowledge of premature birth.  Current Allergies, Complete Past Medical History, Past Surgical History, Family History, and Social History were reviewed in [employer] record.  ROS  The following are not active complaints unless bolded Hoarseness, sore throat, dysphagia, dental problems, itching, sneezing,  nasal congestion or discharge of excess mucus or purulent secretions, ear ache,   fever, chills, sweats, unintended wt loss or wt gain, classically pleuritic or exertional cp,  orthopnea pnd or arm/hand swelling  or leg swelling, presyncope, palpitations, abdominal pain, anorexia, nausea, vomiting, diarrhea  or change in bowel habits or change in bladder habits, change in stools or change in urine, dysuria, hematuria,  rash, arthralgias, visual complaints, headache, numbness, weakness or ataxia or problems with walking or coordination,  change in mood or  memory.                Past Medical History:  Diagnosis Date   Anginal pain ([place])    Anxiety    Arthritis    both hands   Asthma    Chest pain 2005   noncritical coronary disease in 2005: 40% distal RCA, 30% CX, 50% OM 2, EF-60%,   COPD (chronic obstructive pulmonary disease) (HCC)    Chronic bronchitis; 100-pack-year cigarette consumption   Gastroesophageal reflux disease    possible small Schatzki's ring; esophageal dilatation in 2005   H. pylori infection 2014    treated with prevpac   Hyperlipidemia    Hypertension    Overweight(278.02)    Tobacco abuse    100 pack years       Objective:    Wt Readings from Last 3 Encounters:  06/29/21 174 lb 0.6 oz (78.9 kg)  05/13/21 173 lb (78.5 kg)  04/15/21 172 lb 6.4 oz (78.2 kg)      Vital signs reviewed  06/29/2021  - Note at rest 02 sats  98% on RA   General appearance:    amb pleasant wm nad      HEENT : pt wearing mask not removed for exam due to covid - 19 concerns.   NECK :  without JVD/Nodes/TM/ nl carotid upstrokes bilaterally   LUNGS: no acc muscle use,  Min barrel  contour chest wall with bilateral  slightly decreased bs s audible wheeze and  without cough on insp or exp maneuvers and min  Hyperresonant  to  percussion bilaterally     CV:  RRR  no s3 or murmur or increase in P2, and no edema   ABD:  soft and nontender with pos end  insp Hoover's  in the supine position. No bruits or organomegaly appreciated, bowel sounds nl  MS:   Nl gait/  ext warm without deformities, calf tenderness, cyanosis or clubbing No obvious joint restrictions   SKIN: warm and dry without lesions    NEURO:  alert, approp, nl sensorium with  no motor or cerebellar deficits apparent.      CXR PA and Lateral:   05/13/21 I personally reviewed images and agree with radiology impression as follows:    Hyperinflated lungs, compatible with reported COPD. No acute cardiopulmonary disease.    Assessment   Outpatient Encounter Medications as of 06/29/2021  Medication Sig        albuterol (PROVENTIL HFA;VENTOLIN HFA) 108 (90 BASE) MCG/ACT inhaler Inhale 2 puffs into the lungs every 6 (six) hours as needed for shortness of breath.    amLODipine (NORVASC) 2.5 MG tablet Take 2.5 mg by mouth daily.   ANORO ELLIPTA 62.5-25 MCG/INH AEPB Inhale 1 puff into the lungs daily.    aspirin EC 81 MG tablet Take 81 mg by mouth daily.   atenolol (TENORMIN) 50 MG tablet TAKE ONE TABLET BY MOUTH ONCE DAILY IN THE MORNING    cetirizine (ZYRTEC) 10 MG tablet Take 10 mg by mouth daily.   dextromethorphan-guaiFENesin (MUCINEX DM) 30-600 MG 12hr tablet Take 1 tablet by mouth 2 (two) times daily.   Ergocalciferol (VITAMIN D2) 50 MCG (2000 UT) TABS Take 1 tablet by mouth daily.   esomeprazole (NEXIUM) 40 MG capsule Take 1 capsule (40 mg total) by mouth 2 (two) times daily before a meal.   fluticasone (FLONASE)  50 MCG/ACT nasal spray Place 2 sprays into both nostrils daily.   hydrochlorothiazide (MICROZIDE) 12.5 MG capsule TAKE 1 CAPSULE BY MOUTH ONCE A DAY FOR HTN/SWELLING   isosorbide mononitrate (IMDUR) 30 MG 24 hr tablet Take 0.5 tablets (15 mg total) by mouth daily.   LORazepam (ATIVAN) 1 MG tablet TAKE 1 TABLET BY MOUTH 3 TIMES A DAY AS NEEDED   lubiprostone ([name]) 24 MCG capsule Take 1 capsule (24 mcg total) by mouth 2 (two) times daily with a meal.   nitroGLYCERIN (NITROSTAT) 0.4 MG SL tablet Place 0.4 mg under the tongue every 5 (five) minutes as needed for chest pain. Chest Pain   olmesartan (BENICAR) 40 MG tablet Take 1 tablet (40 mg total) by mouth daily.   ondansetron (ZOFRAN ODT) 4 MG disintegrating tablet Take 1 tablet (4 mg total) by mouth every 8 (eight) hours as needed for nausea or vomiting.   polyethylene glycol powder (GLYCOLAX/MIRALAX) powder USE 1 CAPFUL DAILY AS NEEDED FOR CONSTIPATION   sertraline (ZOLOFT) 50 MG tablet Take 50 mg by mouth daily.   simvastatin (ZOCOR) 20 MG tablet Take 20 mg by mouth daily.   tiZANidine (ZANAFLEX) 2 MG tablet Take 2 mg by mouth every 8 (eight) hours as needed.   traZODone (DESYREL) 50 MG tablet Take 50 mg by mouth at bedtime.        VASCEPA 1 g capsule Take 2 g by mouth 2 (two) times daily.   zinc gluconate 50 MG tablet Take 50 mg by mouth daily.

## 2021-06-30 ENCOUNTER — Encounter: Payer: Self-pay | Admitting: Internal Medicine

## 2021-06-30 NOTE — Assessment & Plan Note (Signed)
Try off acei 05/13/2021 for pseuodcopd / choking sensations > resolved as of 06/29/2021   Although even in retrospect it may not be clear the ACEi contributed to the pt's symptoms,  Pt improved off them and adding them back at this point or in the future would risk confusion in interpretation of non-specific respiratory symptoms to which this patient is prone  ie  Better not to muddy the waters here.   >>>> Adequate control on present rx, reviewed in detail with pt > no change in rx needed  = olmesartan 40 mg in place of ACEi         Each maintenance medication was reviewed in detail including emphasizing most importantly the difference between maintenance and prns and under what circumstances the prns are to be triggered using an action plan format where appropriate.  Total time for H and P, chart review, counseling, reviewing dpi/hfa  device(s) and generating customized AVS unique to this office visit / same day charting  > 30 min

## 2021-06-30 NOTE — Assessment & Plan Note (Addendum)
Active smoker - 05/13/2021  After extensive coaching inhaler device,  effectiveness =    90% with elipta > continue anoro   Pt is Group B in terms of symptom/risk and laba/lama therefore appropriate rx at this point >>>  Resume anoro / return for pfts  Re saba I spent extra time with pt today reviewing appropriate use of albuterol for prn use on exertion with the following points: 1) saba is for relief of sob that does not improve by walking a slower pace or resting but rather if the pt does not improve after trying this first. 2) If the pt is convinced, as many are, that saba helps recover from activity faster then it's easy to tell if this is the case by re-challenging : ie stop, take the inhaler, then p 5 minutes try the exact same activity (intensity of workload) that just caused the symptoms and see if they are substantially diminished or not after saba 3) if there is an activity that reproducibly causes the symptoms, try the saba 15 min before the activity on alternate days   If in fact the saba really does help, then fine to continue to use it prn but advised may need to look closer at the maintenance regimen being used to achieve better control of airways disease with exertion.    >>> needs pfts to complete the w/u but f/u per PCP unless really limited by breathing (appears more limited by knees at present)

## 2021-06-30 NOTE — Assessment & Plan Note (Signed)
4-5 min discussion re active cigarette smoking in addition to office E&M  Ask about tobacco use:   ongoing Advise quitting   I took an extended  opportunity with this patient to outline the consequences of continued cigarette use  in airway disorders based on all the data we have from the multiple national lung health studies (perfomed over decades at millions of dollars in cost)  indicating that smoking cessation, not choice of inhalers or physicians, is the most important aspect of his care.  "clean air to your lungs is like your cholesterol pill for your heart"  Assess willingness:  Not committed at this point Assist in quit attempt:  Per PCP when ready Arrange follow up:   Follow up per Primary Care planned

## 2021-07-29 ENCOUNTER — Ambulatory Visit: Payer: Medicare HMO | Admitting: Gastroenterology

## 2021-09-30 ENCOUNTER — Encounter: Payer: Self-pay | Admitting: Cardiology

## 2021-09-30 ENCOUNTER — Ambulatory Visit (INDEPENDENT_AMBULATORY_CARE_PROVIDER_SITE_OTHER): Payer: Medicare HMO | Admitting: Cardiology

## 2021-09-30 ENCOUNTER — Other Ambulatory Visit: Payer: Self-pay

## 2021-09-30 VITALS — BP 100/60 | HR 66 | Ht 66.0 in | Wt 183.2 lb

## 2021-09-30 DIAGNOSIS — R0602 Shortness of breath: Secondary | ICD-10-CM

## 2021-09-30 DIAGNOSIS — I10 Essential (primary) hypertension: Secondary | ICD-10-CM

## 2021-09-30 DIAGNOSIS — E782 Mixed hyperlipidemia: Secondary | ICD-10-CM

## 2021-09-30 DIAGNOSIS — R0789 Other chest pain: Secondary | ICD-10-CM | POA: Diagnosis not present

## 2021-09-30 MED ORDER — LISINOPRIL 20 MG PO TABS: 20.0000 mg | ORAL_TABLET | Freq: Every day | ORAL | 6 refills | Status: DC

## 2021-09-30 NOTE — Patient Instructions (Signed)
Medication Instructions:  Stop Imdur (Isosorbide) Decrease Lisinopril 20mg  daily Continue all other medications.     Labwork: none  Testing/Procedures: none  Follow-Up:  Your physician wants you to follow up in:  1 year.  You will receive a reminder letter in the mail one-two months in advance.  If you don't receive a letter, please call our office to schedule the follow up appointment    Any Other Special Instructions Will Be Listed Below (If Applicable).   If you need a refill on your cardiac medications before your next appointment, please call your pharmacy.

## 2021-09-30 NOTE — Progress Notes (Signed)
Clinical Summary Trevor Berg is a 70 y.o.male seen today for follow up of the following medical problems   1.CAD - long history of chest pain - cath in 2005 without mild to moderate non-obstructive disease - negative stress echo 09/2012, baseline LVEF 55-60%  - 07/2015 Lexiscan was ordered due to chest pain. It showed an inferior defect due to subdiaphragmatic attenuation, no ischemia.   -  03/2017 EGD with multiple erosions of stomach    02/2021 nuclear stress: no ischemia 03/2021 echo LVEF 43-32%, normal diastolic function, normal RV, no significant valve dysfunction - chest pains at times. Can be sharp pain at times. Similar to chronic  - imdur causes SOB, has not helped chest pain since starting.   2. COPD - compliant with inhalers  - chronic SOB unchaged   3. HTN - he is compliant with meds - home bp's can be mid 90s, asymptomatic.      4. Hyperlipidemia Labs followed by pcp - he is on simvastatin     5. AAA screen - history of smoking, male age 44  - 08/2017 no aneurysm   SH: works as a Physiological scientist       Past Medical History:  Diagnosis Date   Anginal pain ([place])    Anxiety    Arthritis    both hands   Asthma    Chest pain 2005   noncritical coronary disease in 2005: 40% distal RCA, 30% CX, 50% OM 2, EF-60%,   COPD (chronic obstructive pulmonary disease) ([facility]CC)    Chronic bronchitis; 100-pack-year cigarette consumption   Gastroesophageal reflux disease    possible small Schatzki's ring; esophageal dilatation in 2005   H. pylori infection 2014   treated with prevpac   Hyperlipidemia    Hypertension    Overweight(278.02)    Tobacco abuse    100 pack years     Allergies  Allergen Reactions   Chantix [Varenicline]     Nightmares and personality changes.   Tomato Rash     Current Outpatient Medications  Medication Sig Dispense Refill   albuterol (PROVENTIL HFA;VENTOLIN HFA) 108 (90 BASE) MCG/ACT inhaler Inhale 2 puffs into the lungs every 6 (six)  hours as needed for shortness of breath.      amLODipine (NORVASC) 2.5 MG tablet Take 2.5 mg by mouth daily.     ANORO ELLIPTA 62.5-25 MCG/INH AEPB Inhale 1 puff into the lungs daily.      aspirin EC 81 MG tablet Take 81 mg by mouth daily.     atenolol (TENORMIN) 50 MG tablet TAKE ONE TABLET BY MOUTH ONCE DAILY IN THE MORNING 30 tablet 3   cetirizine (ZYRTEC) 10 MG tablet Take 10 mg by mouth daily.     dextromethorphan-guaiFENesin (MUCINEX DM) 30-600 MG 12hr tablet Take 1 tablet by mouth 2 (two) times daily.     Ergocalciferol (VITAMIN D2) 50 MCG (2000 UT) TABS Take 1 tablet by mouth daily.     esomeprazole (NEXIUM) 40 MG capsule Take 1 capsule (40 mg total) by mouth 2 (two) times daily before a meal. 60 capsule 11   fluticasone (FLONASE) 50 MCG/ACT nasal spray Place 2 sprays into both nostrils daily.     hydrochlorothiazide (MICROZIDE) 12.5 MG capsule TAKE 1 CAPSULE BY MOUTH ONCE A DAY FOR HTN/SWELLING  3   isosorbide mononitrate (IMDUR) 30 MG 24 hr tablet Take 0.5 tablets (15 mg total) by mouth daily. 15 tablet 6   LORazepam (ATIVAN) 1 MG tablet TAKE 1 TABLET  BY MOUTH 3 TIMES A DAY AS NEEDED 30 tablet 0   lubiprostone ([name]) 24 MCG capsule Take 1 capsule (24 mcg total) by mouth 2 (two) times daily with a meal. 60 capsule 11   nitroGLYCERIN (NITROSTAT) 0.4 MG SL tablet Place 0.4 mg under the tongue every 5 (five) minutes as needed for chest pain. Chest Pain     olmesartan (BENICAR) 40 MG tablet Take 1 tablet (40 mg total) by mouth daily. 30 tablet 11   ondansetron (ZOFRAN ODT) 4 MG disintegrating tablet Take 1 tablet (4 mg total) by mouth every 8 (eight) hours as needed for nausea or vomiting. 20 tablet 0   polyethylene glycol powder (GLYCOLAX/MIRALAX) powder USE 1 CAPFUL DAILY AS NEEDED FOR CONSTIPATION 527 g 11   sertraline (ZOLOFT) 50 MG tablet Take 50 mg by mouth daily.     simvastatin (ZOCOR) 20 MG tablet Take 20 mg by mouth daily.     tiZANidine (ZANAFLEX) 2 MG tablet Take 2 mg by mouth  every 8 (eight) hours as needed.     traZODone (DESYREL) 50 MG tablet Take 50 mg by mouth at bedtime.  0   umeclidinium-vilanterol (ANORO ELLIPTA) 62.5-25 MCG/INH AEPB Inhale 1 puff into the lungs daily. 7 each 0   VASCEPA 1 g capsule Take 2 g by mouth 2 (two) times daily.     zinc gluconate 50 MG tablet Take 50 mg by mouth daily.     No current facility-administered medications for this visit.     Past Surgical History:  Procedure Laterality Date   CERVICAL SPINE SURGERY     C5-6 and C6-7: Relief of spinal stenosis with corpectomy, foraminotomy and fusion   COLONOSCOPY  10/19/2004   RMR:   Friable anal canal, likely source of patient's hematochezia/  Otherwise normal rectum, normal colon.   COLONOSCOPY N/A 09/09/2015   RMR: Pancolonic diverticulosis. Multiple rectal polyps and 1 polyp in the ascending colon. Pathology with hyperplastic rectal polyps and tubular adenoma colon polyp. Repeat in 2021.   COLONOSCOPY WITH PROPOFOL N/A 09/14/2020   Procedure: COLONOSCOPY WITH PROPOFOL;  Surgeon: Daneil Dolin, MD;  [facility]: [facility];  Service: Endoscopy;  Laterality: N/A;  8:30am   ESOPHAGEAL DILATION N/A 09/09/2015   Procedure: ESOPHAGEAL DILATION;  Surgeon: Daneil Dolin, MD;  Location: [facility];  Service: Endoscopy;  Laterality: N/A;   ESOPHAGOGASTRODUODENOSCOPY  09/27/2004   RMR:   A couple of tiny, distal esophageal erosions, otherwise normal esophagus aside from a Schatzki ring status post Maloney dilation as described above/ Small hiatal hernia, otherwise normal stomach, normal D1-D2   ESOPHAGOGASTRODUODENOSCOPY N/A 09/09/2015   Procedure: ESOPHAGOGASTRODUODENOSCOPY (EGD);  Surgeon: Daneil Dolin, MD;  [facility]: [facility];  Service: Endoscopy;  Laterality: N/A;   ESOPHAGOGASTRODUODENOSCOPY N/A 04/19/2017   RMR: Normal esophagus. Dilated. Erosive gastropathy. Biopsied. Otherwise normal. Pathology with mild chronic gastritis. No H. pylori   ESOPHAGOGASTRODUODENOSCOPY  (EGD) WITH ESOPHAGEAL DILATION N/A 03/19/2013   EPP:IRJJOA esophagus s/p dilation/Abnormal stomach of uncertain clinical significance-s/p bx positive for H. pylori, status post Prevpac treatment   ESOPHAGOGASTRODUODENOSCOPY (EGD) WITH PROPOFOL N/A 10/02/2019   Procedure: ESOPHAGOGASTRODUODENOSCOPY (EGD) WITH PROPOFOL;  Surgeon: Daneil Dolin, MD;  [facility];  Service: Endoscopy;  Laterality: N/A;  8:30am   KNEE ARTHROSCOPY WITH MEDIAL MENISECTOMY Right 12/06/2017   Procedure: RIGHT KNEE ARTHROSCOPY WITH PARTIAL MEDIAL MENISECTOMY; REMOVAL OF LOOSE BODY;  Surgeon: Carole Civil, MD;  Location: [facility];  Service: Orthopedics;  Laterality: Right;  MALONEY DILATION N/A 04/19/2017   Procedure: Venia Minks DILATION;  Surgeon: Daneil Dolin, MD;  [facility];  Service: Endoscopy;  Laterality: N/A;   MALONEY DILATION N/A 10/02/2019   Procedure: Venia Minks DILATION;  Surgeon: Daneil Dolin, MD;  [facility];  Service: Endoscopy;  Laterality: N/A;   ORIF ANKLE FRACTURE     right   ORIF TIBIA FRACTURE     Left   TONSILLECTOMY       Allergies  Allergen Reactions   Chantix [Varenicline]     Nightmares and personality changes.   Tomato Rash      Family History  Problem Relation Age of Onset   Heart attack Mother        deceased, age 53s   Ulcers Brother    Diabetes Brother    Hypertension Other    Colon cancer Neg Hx      Social History Mr. Jovel reports that he has been smoking cigarettes. He has a 75.00 pack-year smoking history. He has never used smokeless tobacco. Mr. Lamadrid reports no history of alcohol use.   Review of Systems CONSTITUTIONAL: No weight loss, fever, chills, weakness or fatigue.  HEENT: Eyes: No visual loss, blurred vision, double vision or yellow sclerae.No hearing loss, sneezing, congestion, runny nose or sore throat.  SKIN: No rash or itching.  CARDIOVASCULAR: per hpi RESPIRATORY: No shortness of breath, cough or sputum.   GASTROINTESTINAL: No anorexia, nausea, vomiting or diarrhea. No abdominal pain or blood.  GENITOURINARY: No burning on urination, no polyuria NEUROLOGICAL: No headache, dizziness, syncope, paralysis, ataxia, numbness or tingling in the extremities. No change in bowel or bladder control.  MUSCULOSKELETAL: No muscle, back pain, joint pain or stiffness.  LYMPHATICS: No enlarged nodes. No history of splenectomy.  PSYCHIATRIC: No history of depression or anxiety.  ENDOCRINOLOGIC: No reports of sweating, cold or heat intolerance. No polyuria or polydipsia.  Marland Kitchen   Physical Examination Today's Vitals   09/30/21 1251  BP: 100/60  Pulse: 66  SpO2: 98%  Weight: 183 lb 3.2 oz (83.1 kg)  Height: 5\' 6"  (1.676 m)   Body mass index is 29.57 kg/m.  Gen: resting comfortably, no acute distress HEENT: no scleral icterus, pupils equal round and reactive, no palptable cervical adenopathy,  CV: RRR, no m/r/g no jvd Resp: Clear to auscultation bilaterally GI: abdomen is soft, non-tender, non-distended, normal bowel sounds, no hepatosplenomegaly MSK: extremities are warm, no edema.  Skin: warm, no rash Neuro:  no focal deficits Psych: appropriate affect   Diagnostic Studies  07/2004 Cath The left main is a large vessel with no significant disease.    The LAD is a medium size vessel which courses three quarters of the way down  the anterior wall and has no significant disease.  There are two small  diagonal branches with no significant disease.    The left circumflex is a large vessel coursing through the AV groove.  The  third obtuse marginal Trevyn Lumpkin is free of the AV groove circumflex and noted  to have a mild 30% narrowing after the takeoff the second OM.    The first OM is a small vessel which bifurcates in the proximal portion with  no significant disease.    The second OM is a large vessel which bifurcates distally and has 50% mid  vessel stenosis.    The third OM is a small vessel  with no significant disease.    The right coronary artery is a large vessel which  is dominant and gives rise  to both the PDA as well as the posterolateral Tharon Kitch.  The RCA is ectatic  but has no high grade stenosis.    The PDA is a medium size vessel with no significant disease.    The PLA is a large vessel which bifurcates in the mid segment. There is 40%  lesion in the proximal portion fo the lower bifurcation.    Left ventriculogram reveals a preserved ejection fraction of 60%.    Hemodynamics:  The systemic arterial pressure was 112/69.  Left ventricular  pressure was 115/8.  Left ventricular end diastolic pressure was 13.    CONCLUSION:  1.  Non-critical coronary artery disease.  2.  Normal left ventricular systolic function.     09/2012 Stress echo Study Conclusions  - Stress: There was resting hypertension with a hypertensive   response to stress. Atypical chest pain was present before   and during exercise without increase in intensity, and it   persisted following conclusion of the test. - Stress ECG conclusions: No diagnostic ST abnormalities or   arrhythmias. The stress ECG was negative for ischemia.   [facility] scoring: exercise time of 63min; maximum ST deviation   of 44mm; angina present but did not limit exercise;   resulting score is -1. This score predicts a moderate risk   of cardiac events. - Staged echo: There was no echocardiographic evidence for   stress-induced ischemia. - Peak stress: LV global systolic function was vigorous. The   estimated LV ejection fraction was 70% to 75%. No evidence   for new LV regional wall motion abnormalities.     07/2015 Nuclear stress test There was no ST segment deviation noted during stress. Defect 1: There is a medium defect of mild severity present in the basal inferior, mid inferior and apical inferior location, which is likely due to soft tissue attenuation artifact. A mild degree of myocardial scar cannot entirely be  ruled out. This is a low risk study. Nuclear stress EF: 62%.   02/2021 nuclear stress No diagnostic ST segment changes to indicate ischemia. Small, moderate intensity, apical to basal inferior defect that is fixed and suggestive of scar although normal wall motion suggests possibility of diaphragmatic attenuation as well. No definitive ischemic territories are noted. This is a low risk study. Nuclear stress EF: 71%.    03/2021 echo IMPRESSIONS     1. Left ventricular ejection fraction, by estimation, is 65 to 70%. The  left ventricle has normal function. The left ventricle has no regional  wall motion abnormalities. Left ventricular diastolic parameters were  normal. The average left ventricular  global longitudinal strain is -20.1 %. The global longitudinal strain is  normal.   2. Right ventricular systolic function is normal. The right ventricular  size is normal.   3. The mitral valve is normal in structure. Trivial mitral valve  regurgitation. No evidence of mitral stenosis.   4. The aortic valve has an indeterminant number of cusps. There is  moderate calcification of the aortic valve. There is moderate thickening  of the aortic valve. Aortic valve regurgitation is not visualized. No  aortic stenosis is present.   5. The inferior vena cava is normal in size with greater than 50%  respiratory variability, suggesting right atrial pressure of 3 mmHg.   Assessment and Plan  1.Chest pain/SOB - long history of atypical chest pain and chronic SOB, symptoms overall unchanged over several years - negative ischemic evaluations in the past including  cath in 2005, multiple benign lexiscans including most recently 02/2021 - symptoms not better with imdur and he reports increased SOB since starting, will d/c - no further cardiac testing indicated at this time - EKG today shows SR, no ischemic changes   2. HTN - low bp's at home at times, lower lisinopril to 20mg  daily.    3. HL -  request pcp labs, continue atorvastatin      Arnoldo Lenis, M.D.

## 2021-10-03 ENCOUNTER — Encounter: Payer: Self-pay | Admitting: *Deleted
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
## 2021-10-14 ENCOUNTER — Ambulatory Visit: Payer: Medicare HMO | Admitting: Gastroenterology

## 2021-10-14 ENCOUNTER — Encounter: Payer: Self-pay | Admitting: *Deleted

## 2021-10-14 ENCOUNTER — Encounter: Payer: Self-pay | Admitting: Gastroenterology

## 2021-10-14 ENCOUNTER — Other Ambulatory Visit: Payer: Self-pay | Admitting: Gastroenterology

## 2021-10-14 ENCOUNTER — Other Ambulatory Visit: Payer: Self-pay

## 2021-10-14 VITALS — BP 134/80 | HR 63 | Temp 96.9°F | Ht 66.0 in | Wt 185.0 lb

## 2021-10-14 DIAGNOSIS — K5909 Other constipation: Secondary | ICD-10-CM

## 2021-10-14 DIAGNOSIS — R1310 Dysphagia, unspecified: Secondary | ICD-10-CM

## 2021-10-14 DIAGNOSIS — R1010 Upper abdominal pain, unspecified: Secondary | ICD-10-CM

## 2021-10-14 DIAGNOSIS — K219 Gastro-esophageal reflux disease without esophagitis: Secondary | ICD-10-CM

## 2021-10-14 MED ORDER — OMEPRAZOLE 40 MG PO CPDR: 40.0000 mg | DELAYED_RELEASE_CAPSULE | Freq: Two times a day (BID) | ORAL | 3 refills | Status: DC

## 2021-10-14 MED ORDER — TRULANCE 3 MG PO TABS: 3.0000 mg | ORAL_TABLET | Freq: Every day | ORAL | 3 refills | Status: DC

## 2021-10-14 NOTE — Patient Instructions (Addendum)
We will get you scheduled for an upper endoscopy with possible dilation of your esophagus in the near future with Dr. Gala Romney.  Stop Nexium and start omeprazole 40 mg twice daily 30 minutes before breakfast and dinner.  Follow a GERD diet:  Avoid fried, fatty, greasy, spicy, citrus foods. Avoid caffeine and carbonated beverages. Avoid chocolate. Try eating 4-6 small meals a day rather than 3 large meals. Do not eat within 3 hours of laying down. Prop head of bed up on wood or bricks to create a 6 inch incline.  Stop [name] and start Trulance 3 mg daily.  Start Benefiber 3 teaspoons daily x2 weeks then increase to twice daily.  You may use MiraLAX 1 capful (17 g) daily in 8 ounces of water if needed.  We will plan to follow-up with you in the office after your procedure.  Do not hesitate to call if you have any questions or concerns prior to your next visit.   It was great to see you again today!  I hope you have a very Merry Christmas and happy new year!  Enjoy your great grandbaby!  Aliene Altes, PA-C [facility]

## 2021-10-17 DIAGNOSIS — I708 Atherosclerosis of other arteries: Secondary | ICD-10-CM | POA: Diagnosis not present

## 2021-10-17 DIAGNOSIS — R7303 Prediabetes: Secondary | ICD-10-CM | POA: Diagnosis not present

## 2021-10-17 DIAGNOSIS — J449 Chronic obstructive pulmonary disease, unspecified: Secondary | ICD-10-CM | POA: Diagnosis not present

## 2021-10-17 DIAGNOSIS — Z23 Encounter for immunization: Secondary | ICD-10-CM | POA: Diagnosis not present

## 2021-10-17 DIAGNOSIS — F419 Anxiety disorder, unspecified: Secondary | ICD-10-CM | POA: Diagnosis not present

## 2021-10-17 DIAGNOSIS — I2510 Atherosclerotic heart disease of native coronary artery without angina pectoris: Secondary | ICD-10-CM | POA: Diagnosis not present

## 2021-10-17 DIAGNOSIS — E782 Mixed hyperlipidemia: Secondary | ICD-10-CM | POA: Diagnosis not present

## 2021-10-17 DIAGNOSIS — Z0000 Encounter for general adult medical examination without abnormal findings: Secondary | ICD-10-CM | POA: Diagnosis not present

## 2021-10-17 DIAGNOSIS — K219 Gastro-esophageal reflux disease without esophagitis: Secondary | ICD-10-CM | POA: Diagnosis not present

## 2021-10-17 DIAGNOSIS — I723 Aneurysm of iliac artery: Secondary | ICD-10-CM | POA: Diagnosis not present

## 2021-10-17 DIAGNOSIS — I700 Atherosclerosis of aorta: Secondary | ICD-10-CM | POA: Diagnosis not present

## 2021-10-17 DIAGNOSIS — I10 Essential (primary) hypertension: Secondary | ICD-10-CM | POA: Diagnosis not present

## 2021-10-17 NOTE — Telephone Encounter (Signed)
Trevor Berg, can we find out what is going on with [name]?  Looks like insurance does not want to cover this.  Not sure if prior authorization is required.  They are requesting a change to [name] 290 mcg; however, patient has already tried and failed [name].  He has also failed [name] 24 mcg twice daily.

## 2021-10-18 NOTE — Telephone Encounter (Signed)
Completed prior authorization. Waiting for approval. Pt previously tried [name] 290 mcg and [name] 24 mg and neither one of them worked for him.

## 2021-10-18 NOTE — Telephone Encounter (Signed)
Received approval letter from [employer] for coverage from 10/30/2020-10/29/2021. Called and spoke to [place] at [employer] and she informed me that I would have to complete another PA for 2023. Papers sent to the scan center.

## 2021-10-18 NOTE — Telephone Encounter (Signed)
Noted  

## 2021-10-19 NOTE — Telephone Encounter (Signed)
Received approval letter from [place] for coverage from 10/30/2021-10/29/2022. Papers sent to the scan center.

## 2021-10-27 ENCOUNTER — Other Ambulatory Visit: Payer: Self-pay | Admitting: Orthopedic Surgery

## 2021-10-27 DIAGNOSIS — M542 Cervicalgia: Secondary | ICD-10-CM

## 2021-10-27 DIAGNOSIS — R269 Unspecified abnormalities of gait and mobility: Secondary | ICD-10-CM

## 2021-10-27 DIAGNOSIS — G992 Myelopathy in diseases classified elsewhere: Secondary | ICD-10-CM

## 2021-10-27 DIAGNOSIS — M4802 Spinal stenosis, cervical region: Secondary | ICD-10-CM

## 2021-10-30 ENCOUNTER — Other Ambulatory Visit: Payer: Self-pay | Admitting: Internal Medicine

## 2021-11-03 NOTE — Patient Instructions (Signed)
Trevor Berg  11/03/2021     @PREFPERIOPPHARMACY @   Your procedure is scheduled on  11/10/2021.   Report to [facility] at  1300  (1:00) P.M.   Call this number if you have problems the morning of surgery:  [phone]   Remember:  Follow the diet  instructions given to you by the office.    Use your inhalers before you come and bring your rescue inhaler with you.    Take these medicines the morning of surgery with A SIP OF WATER        norvasc, atenolol, zyrtec, ativan (if needed), omeprazole, zofran (if needed), zoloft, zanaflex (if needed), vascepa.     Do not wear jewelry, make-up or nail polish.  Do not wear lotions, powders, or perfumes, or deodorant.  Do not shave 48 hours prior to surgery.  Men may shave face and neck.  Do not bring valuables to the hospital.  [facility] is not responsible for any belongings or valuables.  Contacts, dentures or bridgework may not be worn into surgery.  Leave your suitcase in the car.  After surgery it may be brought to your room.  For patients admitted to the hospital, discharge time will be determined by your treatment team.  Patients discharged the day of surgery will not be allowed to drive home and must have someone with them for 24 hours.    Special instructions:   DO NOT smoke tobacco or vape for 24 hours before your procedure.  Please read over the following fact sheets that you were given. Anesthesia Post-op Instructions and Care and Recovery After Surgery      Upper Endoscopy, Adult, Care After This sheet gives you information about how to care for yourself after your procedure. Your health care provider may also give you more specific instructions. If you have problems or questions, contact your health care provider. What can I expect after the procedure? After the procedure, it is common to have: A sore throat. Mild stomach pain or discomfort. Bloating. Nausea. Follow these instructions at  home:  Follow instructions from your health care provider about what to eat or drink after your procedure. Return to your normal activities as told by your health care provider. Ask your health care provider what activities are safe for you. Take over-the-counter and prescription medicines only as told by your health care provider. If you were given a sedative during the procedure, it can affect you for several hours. Do not drive or operate machinery until your health care provider says that it is safe. Keep all follow-up visits as told by your health care provider. This is important. Contact a health care provider if you have: A sore throat that lasts longer than one day. Trouble swallowing. Get help right away if: You vomit blood or your vomit looks like coffee grounds. You have: A fever. Bloody, black, or tarry stools. A severe sore throat or you cannot swallow. Difficulty breathing. Severe pain in your chest or abdomen. Summary After the procedure, it is common to have a sore throat, mild stomach discomfort, bloating, and nausea. If you were given a sedative during the procedure, it can affect you for several hours. Do not drive or operate machinery until your health care provider says that it is safe. Follow instructions from your health care provider about what to eat or drink after your procedure. Return to your normal activities as told by your health care provider.  This information is not intended to replace advice given to you by your health care provider. Make sure you discuss any questions you have with your health care provider. Document Revised: 08/22/2019 Document Reviewed: 03/18/2018 Elsevier Patient Education  2022 [place]. Esophageal Dilatation Esophageal dilatation, also called esophageal dilation, is a procedure to widen or open a blocked or narrowed part of the esophagus. The esophagus is the part of the body that moves food and liquid from the mouth to the  stomach. You may need this procedure if: You have a buildup of scar tissue in your esophagus that makes it difficult, painful, or impossible to swallow. This can be caused by gastroesophageal reflux disease (GERD). You have cancer of the esophagus. There is a problem with how food moves through your esophagus. In some cases, you may need this procedure repeated at a later time to dilate the esophagus gradually. Tell a health care provider about: Any allergies you have. All medicines you are taking, including vitamins, herbs, eye drops, creams, and over-the-counter medicines. Any problems you or family members have had with anesthetic medicines. Any blood disorders you have. Any surgeries you have had. Any medical conditions you have. Any antibiotic medicines you are required to take before dental procedures. Whether you are pregnant or may be pregnant. What are the risks? Generally, this is a safe procedure. However, problems may occur, including: Bleeding due to a tear in the lining of the esophagus. A hole, or perforation, in the esophagus. What happens before the procedure? Ask your health care provider about: Changing or stopping your regular medicines. This is especially important if you are taking diabetes medicines or blood thinners. Taking medicines such as aspirin and ibuprofen. These medicines can thin your blood. Do not take these medicines unless your health care provider tells you to take them. Taking over-the-counter medicines, vitamins, herbs, and supplements. Follow instructions from your health care provider about eating or drinking restrictions. Plan to have a responsible adult take you home from the hospital or clinic. Plan to have a responsible adult care for you for the time you are told after you leave the hospital or clinic. This is important. What happens during the procedure? You may be given a medicine to help you relax (sedative). A numbing medicine may be  sprayed into the back of your throat, or you may gargle the medicine. Your health care provider may perform the dilatation using various surgical instruments, such as: Simple dilators. This instrument is carefully placed in the esophagus to stretch it. Guided wire bougies. This involves using an endoscope to insert a wire into the esophagus. A dilator is passed over this wire to enlarge the esophagus. Then the wire is removed. Balloon dilators. An endoscope with a small balloon is inserted into the esophagus. The balloon is inflated to stretch the esophagus and open it up. The procedure may vary among health care providers and hospitals. What can I expect after the procedure? Your blood pressure, heart rate, breathing rate, and blood oxygen level will be monitored until you leave the hospital or clinic. Your throat may feel slightly sore and numb. This will get better over time. You will not be allowed to eat or drink until your throat is no longer numb. When you are able to drink, urinate, and sit on the edge of the bed without nausea or dizziness, you may be able to return home. Follow these instructions at home: Take over-the-counter and prescription medicines only as told by your health  care provider. If you were given a sedative during the procedure, it can affect you for several hours. Do not drive or operate machinery until your health care provider says that it is safe. Plan to have a responsible adult care for you for the time you are told. This is important. Follow instructions from your health care provider about any eating or drinking restrictions. Do not use any products that contain nicotine or tobacco, such as cigarettes, e-cigarettes, and chewing tobacco. If you need help quitting, ask your health care provider. Keep all follow-up visits. This is important. Contact a health care provider if: You have a fever. You have pain that is not relieved by medicine. Get help right away  if: You have chest pain. You have trouble breathing. You have trouble swallowing. You vomit blood. You have black, tarry, or bloody stools. These symptoms may represent a serious problem that is an emergency. Do not wait to see if the symptoms will go away. Get medical help right away. Call your local emergency services (911 in the [place] Do not drive yourself to the hospital. Summary Esophageal dilatation, also called esophageal dilation, is a procedure to widen or open a blocked or narrowed part of the esophagus. Plan to have a responsible adult take you home from the hospital or clinic. For this procedure, a numbing medicine may be sprayed into the back of your throat, or you may gargle the medicine. Do not drive or operate machinery until your health care provider says that it is safe. This information is not intended to replace advice given to you by your health care provider. Make sure you discuss any questions you have with your health care provider. Document Revised: 03/03/2020 Document Reviewed: 03/03/2020 Elsevier Patient Education  [place] After This sheet gives you information about how to care for yourself after your procedure. Your health care provider may also give you more specific instructions. If you have problems or questions, contact your health care provider. What can I expect after the procedure? After the procedure, it is common to have: Tiredness. Forgetfulness about what happened after the procedure. Impaired judgment for important decisions. Nausea or vomiting. Some difficulty with balance. Follow these instructions at home: For the time period you were told by your health care provider:   Rest as needed. Do not participate in activities where you could fall or become injured. Do not drive or use machinery. Do not drink alcohol. Do not take sleeping pills or medicines that cause drowsiness. Do not make important  decisions or sign legal documents. Do not take care of children on your own. Eating and drinking Follow the diet that is recommended by your health care provider. Drink enough fluid to keep your urine pale yellow. If you vomit: Drink water, juice, or soup when you can drink without vomiting. Make sure you have little or no nausea before eating solid foods. General instructions Have a responsible adult stay with you for the time you are told. It is important to have someone help care for you until you are awake and alert. Take over-the-counter and prescription medicines only as told by your health care provider. If you have sleep apnea, surgery and certain medicines can increase your risk for breathing problems. Follow instructions from your health care provider about wearing your sleep device: Anytime you are sleeping, including during daytime naps. While taking prescription pain medicines, sleeping medicines, or medicines that make you drowsy. Avoid smoking. Keep all follow-up  visits as told by your health care provider. This is important. Contact a health care provider if: You keep feeling nauseous or you keep vomiting. You feel light-headed. You are still sleepy or having trouble with balance after 24 hours. You develop a rash. You have a fever. You have redness or swelling around the IV site. Get help right away if: You have trouble breathing. You have new-onset confusion at home. Summary For several hours after your procedure, you may feel tired. You may also be forgetful and have poor judgment. Have a responsible adult stay with you for the time you are told. It is important to have someone help care for you until you are awake and alert. Rest as told. Do not drive or operate machinery. Do not drink alcohol or take sleeping pills. Get help right away if you have trouble breathing, or if you suddenly become confused. This information is not intended to replace advice given to you  by your health care provider. Make sure you discuss any questions you have with your health care provider. Document Revised: 07/01/2020 Document Reviewed: 09/18/2019 Elsevier Patient Education  2022 [employer].

## 2021-11-07 ENCOUNTER — Encounter (HOSPITAL_COMMUNITY): Payer: Self-pay

## 2021-11-07 ENCOUNTER — Other Ambulatory Visit: Payer: Self-pay

## 2021-11-07 ENCOUNTER — Encounter (HOSPITAL_COMMUNITY)
Admission: RE | Admit: 2021-11-07 | Discharge: 2021-11-07 | Disposition: A | Discharge status: Home / Self Care | Payer: Medicare HMO | Source: Ambulatory Visit | Attending: Internal Medicine | Admitting: Internal Medicine

## 2021-11-07 VITALS — BP 123/75 | HR 62 | Temp 97.9°F | Resp 18 | Ht 66.0 in | Wt 172.0 lb

## 2021-11-07 DIAGNOSIS — Z5181 Encounter for therapeutic drug level monitoring: Secondary | ICD-10-CM | POA: Insufficient documentation

## 2021-11-07 DIAGNOSIS — Z01812 Encounter for preprocedural laboratory examination: Secondary | ICD-10-CM | POA: Insufficient documentation

## 2021-11-07 DIAGNOSIS — Z79899 Other long term (current) drug therapy: Secondary | ICD-10-CM | POA: Insufficient documentation

## 2021-11-07 HISTORY — DX: Sleep apnea, unspecified: G47.30

## 2021-11-07 LAB — BASIC METABOLIC PANEL
Anion gap: 8 (ref 5–15)
BUN: 15 mg/dL (ref 8–23)
CO2: 21 mmol/L — ABNORMAL LOW (ref 22–32)
Calcium: 8.8 mg/dL — ABNORMAL LOW (ref 8.9–10.3)
Chloride: 109 mmol/L (ref 98–111)
Creatinine, Ser: 1.13 mg/dL (ref 0.61–1.24)
GFR, Estimated: >60 mL/min (ref >60)
Glucose, Bld: 95 mg/dL (ref 70–99)
Potassium: 3.9 mmol/L (ref 3.5–5.1)
Sodium: 138 mmol/L (ref 135–145)

## 2021-11-10 ENCOUNTER — Encounter (HOSPITAL_COMMUNITY)
Admission: RE | Disposition: A | Discharge status: Home / Self Care | Payer: Self-pay | Source: Home / Self Care | Attending: Internal Medicine

## 2021-11-10 ENCOUNTER — Ambulatory Visit (HOSPITAL_COMMUNITY): Payer: Medicare HMO | Admitting: Certified Registered Nurse Anesthetist

## 2021-11-10 ENCOUNTER — Encounter (HOSPITAL_COMMUNITY): Payer: Self-pay | Admitting: Internal Medicine

## 2021-11-10 ENCOUNTER — Other Ambulatory Visit: Payer: Self-pay

## 2021-11-10 ENCOUNTER — Ambulatory Visit (HOSPITAL_COMMUNITY)
Admission: RE | Admit: 2021-11-10 | Discharge: 2021-11-10 | Disposition: A | Discharge status: Home / Self Care | Payer: Medicare HMO | Attending: Internal Medicine | Admitting: Internal Medicine

## 2021-11-10 DIAGNOSIS — Z6827 Body mass index (BMI) 27.0-27.9, adult: Secondary | ICD-10-CM | POA: Insufficient documentation

## 2021-11-10 DIAGNOSIS — Z86010 Personal history of colonic polyps: Secondary | ICD-10-CM | POA: Diagnosis not present

## 2021-11-10 DIAGNOSIS — F17210 Nicotine dependence, cigarettes, uncomplicated: Secondary | ICD-10-CM | POA: Diagnosis not present

## 2021-11-10 DIAGNOSIS — K5909 Other constipation: Secondary | ICD-10-CM | POA: Diagnosis not present

## 2021-11-10 DIAGNOSIS — R1310 Dysphagia, unspecified: Secondary | ICD-10-CM | POA: Insufficient documentation

## 2021-11-10 DIAGNOSIS — J449 Chronic obstructive pulmonary disease, unspecified: Secondary | ICD-10-CM | POA: Diagnosis not present

## 2021-11-10 DIAGNOSIS — E785 Hyperlipidemia, unspecified: Secondary | ICD-10-CM | POA: Insufficient documentation

## 2021-11-10 DIAGNOSIS — G4730 Sleep apnea, unspecified: Secondary | ICD-10-CM | POA: Diagnosis not present

## 2021-11-10 DIAGNOSIS — K219 Gastro-esophageal reflux disease without esophagitis: Secondary | ICD-10-CM | POA: Diagnosis not present

## 2021-11-10 DIAGNOSIS — Z87828 Personal history of other (healed) physical injury and trauma: Secondary | ICD-10-CM | POA: Insufficient documentation

## 2021-11-10 DIAGNOSIS — I209 Angina pectoris, unspecified: Secondary | ICD-10-CM | POA: Diagnosis not present

## 2021-11-10 DIAGNOSIS — Z79899 Other long term (current) drug therapy: Secondary | ICD-10-CM | POA: Insufficient documentation

## 2021-11-10 DIAGNOSIS — I10 Essential (primary) hypertension: Secondary | ICD-10-CM | POA: Diagnosis not present

## 2021-11-10 DIAGNOSIS — M3119 Other thrombotic microangiopathy: Secondary | ICD-10-CM | POA: Diagnosis not present

## 2021-11-10 DIAGNOSIS — M1990 Unspecified osteoarthritis, unspecified site: Secondary | ICD-10-CM | POA: Insufficient documentation

## 2021-11-10 DIAGNOSIS — R634 Abnormal weight loss: Secondary | ICD-10-CM | POA: Insufficient documentation

## 2021-11-10 DIAGNOSIS — G8929 Other chronic pain: Secondary | ICD-10-CM | POA: Insufficient documentation

## 2021-11-10 DIAGNOSIS — R1010 Upper abdominal pain, unspecified: Secondary | ICD-10-CM | POA: Insufficient documentation

## 2021-11-10 DIAGNOSIS — J441 Chronic obstructive pulmonary disease with (acute) exacerbation: Secondary | ICD-10-CM | POA: Diagnosis not present

## 2021-11-10 HISTORY — PX: ESOPHAGOGASTRODUODENOSCOPY (EGD) WITH PROPOFOL: SHX5813

## 2021-11-10 HISTORY — PX: MALONEY DILATION: SHX5535

## 2021-11-10 SURGERY — ESOPHAGOGASTRODUODENOSCOPY (EGD) WITH PROPOFOL
Anesthesia: General

## 2021-11-10 MED ORDER — STERILE WATER FOR IRRIGATION IR SOLN
Status: DC | PRN
  Administered 2021-11-10: 60 mL

## 2021-11-10 MED ORDER — LIDOCAINE HCL (CARDIAC) PF 100 MG/5ML IV SOSY
PREFILLED_SYRINGE | INTRAVENOUS | Status: DC | PRN
  Administered 2021-11-10: 50 mg via INTRAVENOUS

## 2021-11-10 MED ORDER — LACTATED RINGERS IV SOLN
INTRAVENOUS | Status: DC
  Administered 2021-11-10: 1000 mL via INTRAVENOUS

## 2021-11-10 MED ORDER — PROPOFOL 10 MG/ML IV BOLUS
INTRAVENOUS | Status: DC | PRN
Start: 2021-11-10 — End: 2021-11-10
  Administered 2021-11-10: 80 mg via INTRAVENOUS
  Administered 2021-11-10: 30 mg via INTRAVENOUS

## 2021-11-10 NOTE — Interval H&P Note (Signed)
History and Physical Interval Note:  11/10/2021 2:35 PM  Trevor Berg  has presented today for surgery, with the diagnosis of upper abd pain, gerd, dysphagia.  The various methods of treatment have been discussed with the patient and family. After consideration of risks, benefits and other options for treatment, the patient has consented to  Procedure(s) with comments: ESOPHAGOGASTRODUODENOSCOPY (EGD) WITH PROPOFOL (N/A) - 2:45pm MALONEY DILATION (N/A) as a surgical intervention.  The patient's history has been reviewed, patient examined, no change in status, stable for surgery.  I have reviewed the patient's chart and labs.  Questions were answered to the patient's satisfaction.     Trevor Berg   no change.  EGD with possible esophageal dilation as feasible/appropriate today per plan. The risks, benefits, limitations, alternatives and imponderables have been reviewed with the patient. Potential for esophageal dilation, biopsy, etc. have also been reviewed.  Questions have been answered. All parties agreeable.

## 2021-11-10 NOTE — Discharge Instructions (Addendum)
EGD Discharge instructions Please read the instructions outlined below and refer to this sheet in the next few weeks. These discharge instructions provide you with general information on caring for yourself after you leave the hospital. Your doctor may also give you specific instructions. While your treatment has been planned according to the most current medical practices available, unavoidable complications occasionally occur. If you have any problems or questions after discharge, please call your doctor. ACTIVITY You may resume your regular activity but move at a slower pace for the next 24 hours.  Take frequent rest periods for the next 24 hours.  Walking will help expel (get rid of) the air and reduce the bloated feeling in your abdomen.  No driving for 24 hours (because of the anesthesia (medicine) used during the test).  You may shower.  Do not sign any important legal documents or operate any machinery for 24 hours (because of the anesthesia used during the test).  NUTRITION Drink plenty of fluids.  You may resume your normal diet.  Begin with a light meal and progress to your normal diet.  Avoid alcoholic beverages for 24 hours or as instructed by your caregiver.  MEDICATIONS You may resume your normal medications unless your caregiver tells you otherwise.  WHAT YOU CAN EXPECT TODAY You may experience abdominal discomfort such as a feeling of fullness or gas pains.  FOLLOW-UP Your doctor will discuss the results of your test with you.  SEEK IMMEDIATE MEDICAL ATTENTION IF ANY OF THE FOLLOWING OCCUR: Excessive nausea (feeling sick to your stomach) and/or vomiting.  Severe abdominal pain and distention (swelling).  Trouble swallowing.  Temperature over 101 F (37.8 C).  Rectal bleeding or vomiting of blood.     Your esophagus was dilated today  Continue all your current medications  Office visit with Aliene Altes in 2 months   at patient request,   I called Benedetto Goad  at [phone] -  reviewed findings and recommendations

## 2021-11-10 NOTE — Anesthesia Preprocedure Evaluation (Addendum)
Anesthesia Evaluation  Patient identified by MRN, date of birth, ID band Patient awake    Reviewed: Allergy & Precautions, H&P , NPO status , Patient's Chart, lab work & pertinent test results, reviewed documented beta blocker date and time   Airway Mallampati: II  TM Distance: >3 FB Neck ROM: full    Dental  (+) Edentulous Lower, Edentulous Upper   Pulmonary asthma , sleep apnea , COPD,  COPD inhaler, Current Smoker and Patient abstained from smoking.,    Pulmonary exam normal breath sounds clear to auscultation       Cardiovascular Exercise Tolerance: Good hypertension, + angina  Rhythm:regular Rate:Normal     Neuro/Psych Anxiety negative neurological ROS  negative psych ROS   GI/Hepatic Neg liver ROS, GERD  Medicated,  Endo/Other  negative endocrine ROS  Renal/GU negative Renal ROS  negative genitourinary   Musculoskeletal  (+) Arthritis ,   Abdominal   Peds  Hematology negative hematology ROS (+)   Anesthesia Other Findings 1. Left ventricular ejection fraction, by estimation, is 65 to 70%. The  left ventricle has normal function. The left ventricle has no regional  wall motion abnormalities. Left ventricular diastolic parameters were  normal. The average left ventricular  global longitudinal strain is -20.1 %. The global longitudinal strain is  normal.  2. Right ventricular systolic function is normal. The right ventricular  size is normal.  3. The mitral valve is normal in structure. Trivial mitral valve  regurgitation. No evidence of mitral stenosis.  4. The aortic valve has an indeterminant number of cusps. There is  moderate calcification of the aortic valve. There is moderate thickening  of the aortic valve. Aortic valve regurgitation is not visualized. No  aortic stenosis is present.  5. The inferior vena cava is normal in size with greater than 50%  respiratory variability, suggesting right  atrial pressure of 3 mmHg.   Reproductive/Obstetrics negative OB ROS                           Anesthesia Physical Anesthesia Plan  ASA: 3  Anesthesia Plan: General   Post-op Pain Management:    Induction:   PONV Risk Score and Plan: Propofol infusion  Airway Management Planned:   Additional Equipment:   Intra-op Plan:   Post-operative Plan:   Informed Consent: I have reviewed the patients History and Physical, chart, labs and discussed the procedure including the risks, benefits and alternatives for the proposed anesthesia with the patient or authorized representative who has indicated his/her understanding and acceptance.     Dental Advisory Given  Plan Discussed with: CRNA  Anesthesia Plan Comments:        Anesthesia Quick Evaluation

## 2021-11-10 NOTE — Op Note (Signed)
[facility] Patient Name: Trevor Berg Procedure Date: 11/10/2021 2:28 PM MRN: [id number] Date of Birth: 31-May-1951 Attending MD: [name] , MD CSN: [id number] Age: 71 Admit Type: Outpatient Procedure:                Upper GI endoscopy Indications:              Dysphagia Providers:                [name], MD, Caprice Kluver, Thomas Hoff., Technician Referring MD:              Medicines:                Propofol per Anesthesia Complications:            No immediate complications. Estimated Blood Loss:     Estimated blood loss: none. Procedure:                Pre-Anesthesia Assessment:                           - Prior to the procedure, a History and Physical                            was performed, and patient medications and                            allergies were reviewed. The patient's tolerance of                            previous anesthesia was also reviewed. The risks                            and benefits of the procedure and the sedation                            options and risks were discussed with the patient.                            All questions were answered, and informed consent                            was obtained. Prior Anticoagulants: The patient has                            taken no previous anticoagulant or antiplatelet                            agents. ASA Grade Assessment: III - A patient with                            severe systemic disease. After reviewing the risks  and benefits, the patient was deemed in                            satisfactory condition to undergo the procedure.                           After obtaining informed consent, the endoscope was                            passed under direct vision. Throughout the                            procedure, the patient's blood pressure, pulse, and                            oxygen saturations were  monitored continuously. The                            [id number]-H190 ([id number]) scope was introduced through the                            mouth, and advanced to the second part of duodenum.                            The upper GI endoscopy was accomplished without                            difficulty. The patient tolerated the procedure                            well. Scope In: 2:48:14 PM Scope Out: 2:55:36 PM Total Procedure Duration: 0 hours 7 minutes 22 seconds  Findings:      The examined esophagus was normal. ined following endoscope reinsertion       and showed no change. Estimated blood loss: none.      The entire examined stomach was normal.      The duodenal bulb and second portion of the duodenum were normal. The       scope was withdrawn. Dilation was performed with a Maloney dilator with       mild resistance at 56 Fr. The scope was withdrawn. Dilation was       performed with a Maloney dilator with mild resistance at 27 Fr. The       dilation site was examined following endoscope reinsertion and showed no       change. Estimated blood loss: none. Impression:               - Normal esophagus. Dilated.                           - Normal stomach.                           - Normal duodenal bulb and second portion of the  duodenum.                           - No specimens collected. Moderate Sedation:      Moderate (conscious) sedation was personally administered by an       anesthesia professional. The following parameters were monitored: oxygen       saturation, heart rate, blood pressure, respiratory rate, EKG, adequacy       of pulmonary ventilation, and response to care. Recommendation:           - Patient has a contact number available for                            emergencies. The signs and symptoms of potential                            delayed complications were discussed with the                            patient. Return to normal  activities tomorrow.                            Written discharge instructions were provided to the                            patient.                           - Resume previous diet.                           - Continue present medications.                           - Return to my office in 2 months. Procedure Code(s):        --- Professional ---                           [phone], Esophagogastroduodenoscopy, flexible,                            transoral; diagnostic, including collection of                            specimen(s) by brushing or washing, when performed                            (separate procedure)                           43450, Dilation of esophagus, by unguided sound or                            bougie, single or multiple passes Diagnosis Code(s):        --- Professional ---  R13.10, Dysphagia, unspecified CPT copyright 2019 American Medical Association. All rights reserved. The codes documented in this report are preliminary and upon coder review may  be revised to meet current compliance requirements. [name]. Mohmed Farver, MD [name], MD 11/10/2021 3:06:07 PM This report has been signed electronically. Number of Addenda: 0

## 2021-11-10 NOTE — Transfer of Care (Signed)
Immediate Anesthesia Transfer of Care Note  Patient: Trevor Berg  Procedure(s) Performed: ESOPHAGOGASTRODUODENOSCOPY (EGD) WITH PROPOFOL [place]  Patient Location: Short Stay  Anesthesia Type:General  Level of Consciousness: drowsy  Airway & Oxygen Therapy: Patient Spontanous Breathing  Post-op Assessment: Report given to RN and Post -op Vital signs reviewed and stable  Post vital signs: Reviewed and stable  Last Vitals:  Vitals Value Taken Time  BP    Temp    Pulse    Resp    SpO2      Last Pain:  Vitals:   11/10/21 1443  TempSrc:   PainSc: 0-No pain      Patients Stated Pain Goal: 9 (29/92/42 6834)  Complications: No notable events documented.

## 2021-11-11 NOTE — Anesthesia Postprocedure Evaluation (Signed)
Anesthesia Post Note  Patient: PAWAN KNECHTEL  Procedure(s) Performed: ESOPHAGOGASTRODUODENOSCOPY (EGD) WITH PROPOFOL [place]  Patient location during evaluation: Phase II Anesthesia Type: General Level of consciousness: awake Pain management: pain level controlled Vital Signs Assessment: post-procedure vital signs reviewed and stable Respiratory status: spontaneous breathing and respiratory function stable Cardiovascular status: blood pressure returned to baseline and stable Postop Assessment: no headache and no apparent nausea or vomiting Anesthetic complications: no Comments: Late entry   No notable events documented.   Last Vitals:  Vitals:   11/10/21 1312 11/10/21 1500  BP: 100/72 (!) 96/50  Pulse:  62  Resp: 13 16  Temp: 36.5 C 36.9 C  SpO2: 97% 98%    Last Pain:  Vitals:   11/10/21 1500  TempSrc:   PainSc: 0-No pain                 Louann Sjogren

## 2021-11-12 ENCOUNTER — Other Ambulatory Visit: Payer: Self-pay | Admitting: Internal Medicine

## 2021-11-14 ENCOUNTER — Encounter (HOSPITAL_COMMUNITY): Payer: Self-pay | Admitting: Internal Medicine

## 2021-11-16 NOTE — Telephone Encounter (Signed)
Pt is taking Trulance.

## 2021-11-16 NOTE — Telephone Encounter (Signed)
Getting request for RX for [name] BUT patient seen recently by Cyril Mourning and started on Trulance.   Please find out what he wants to take. I have seen Trulance, [name] and now [name] in his chart over the last 1-2 months. See which one he prefers and make sure he knows to take only one of these.

## 2021-12-06 ENCOUNTER — Other Ambulatory Visit: Payer: Self-pay | Admitting: *Deleted

## 2021-12-06 DIAGNOSIS — Z87891 Personal history of nicotine dependence: Secondary | ICD-10-CM

## 2021-12-06 DIAGNOSIS — F17210 Nicotine dependence, cigarettes, uncomplicated: Secondary | ICD-10-CM

## 2021-12-18 IMAGING — CT CT ANGIO CHEST
2 of 6 series · 18 of 46 positions shown · IV contrast (Omnipaque or Isovue)
Comparison: None.

CLINICAL DATA: Short of breath.  Concern for pulmonary embolism.

EXAM:
CT ANGIOGRAPHY CHEST WITH CONTRAST
TECHNIQUE: Multidetector CT imaging of the chest was performed using the
standard protocol during bolus administration of intravenous
contrast. Multiplanar CT image reconstructions and MIPs were
obtained to evaluate the vascular anatomy.
CONTRAST:  100mL OMNIPAQUE IOHEXOL 350 MG/ML SOLN

[Series 5: pe axial thins · axial · 0.77mm/px · z∈[+1129,+1422]mm · 15 of 321 slices shown]
[im 14/321  lung]
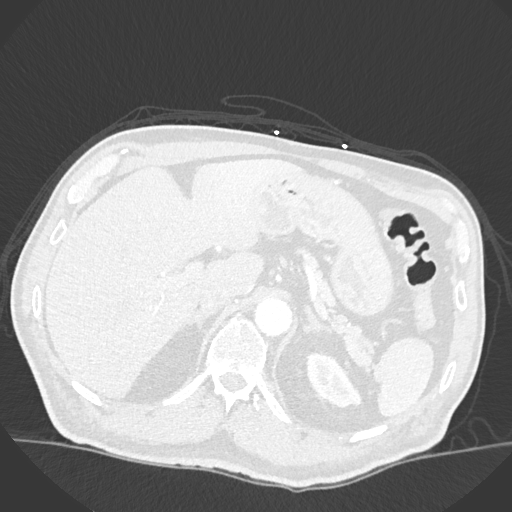
[im 42/321  soft-tissue]
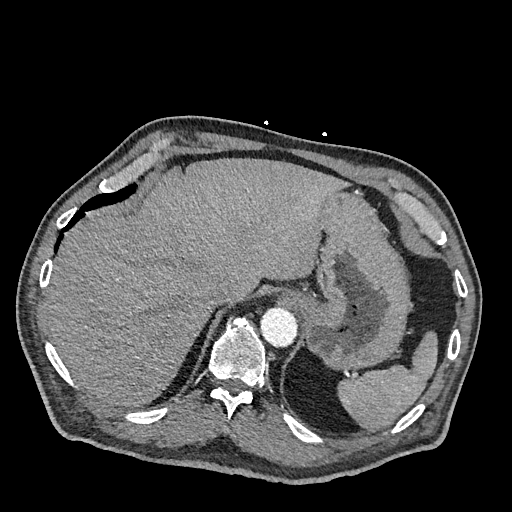
[im 56/321  lung]
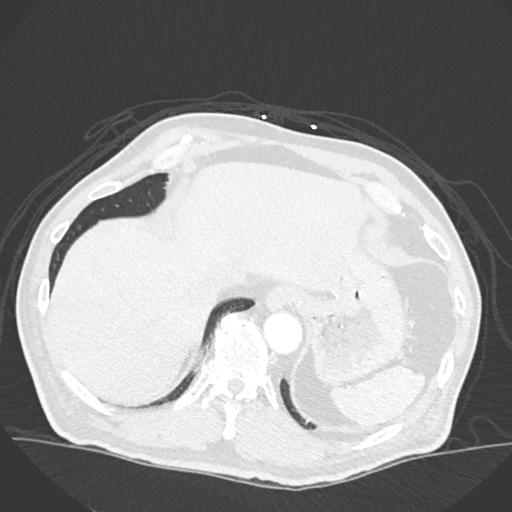
[im 84/321  soft-tissue]
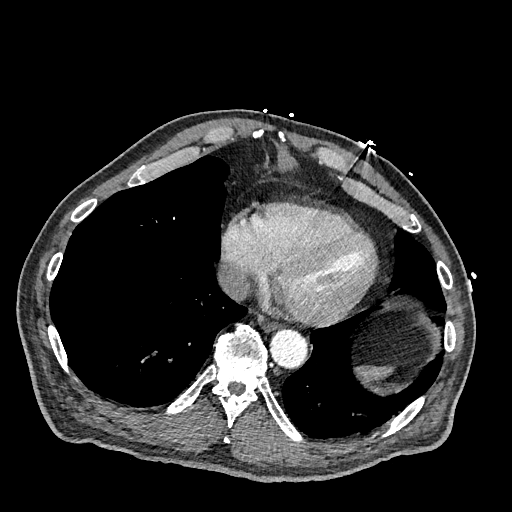
[im 98/321  lung]
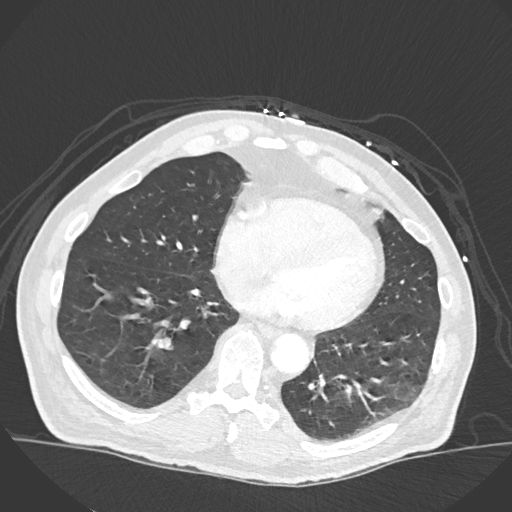
[im 126/321  soft-tissue]
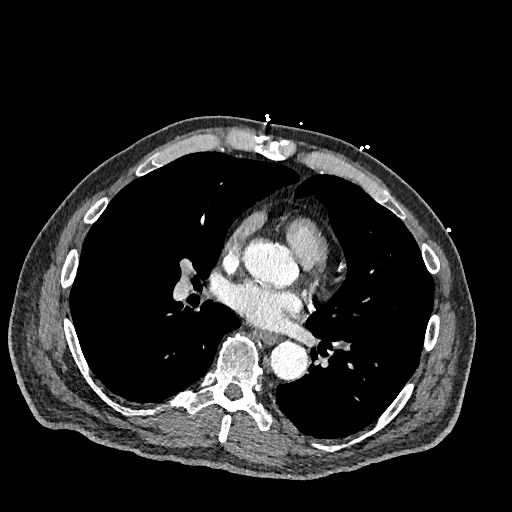
[im 140/321  lung]
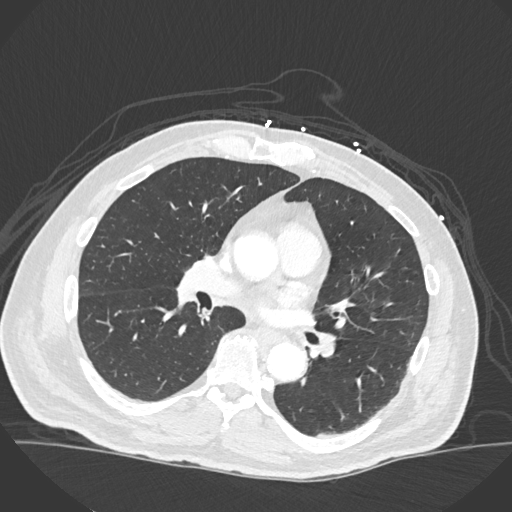
[im 167/321  soft-tissue]
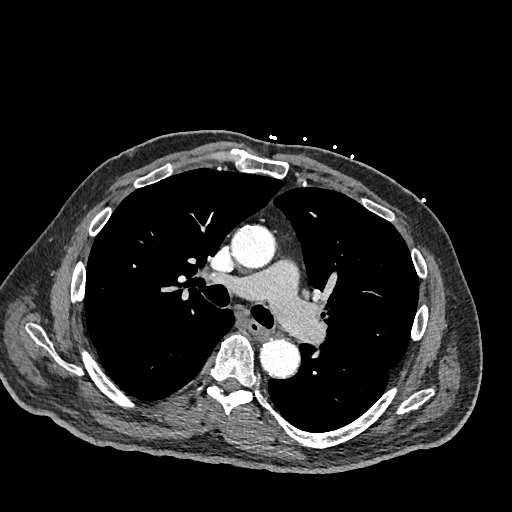
[im 181/321  lung]
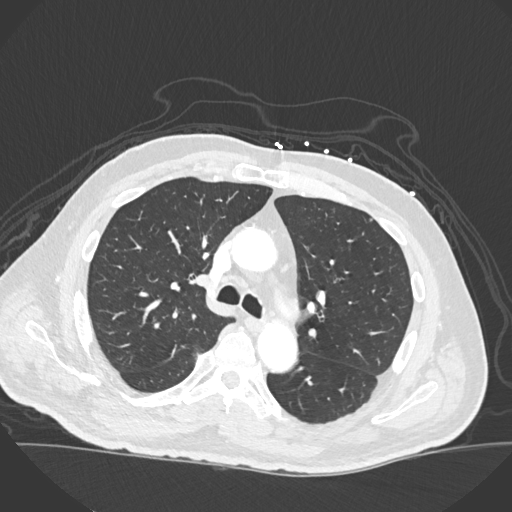
[im 195/321  soft-tissue]
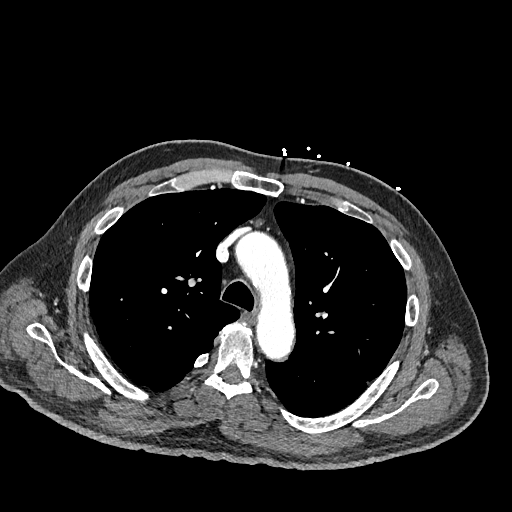
[im 223/321  lung]
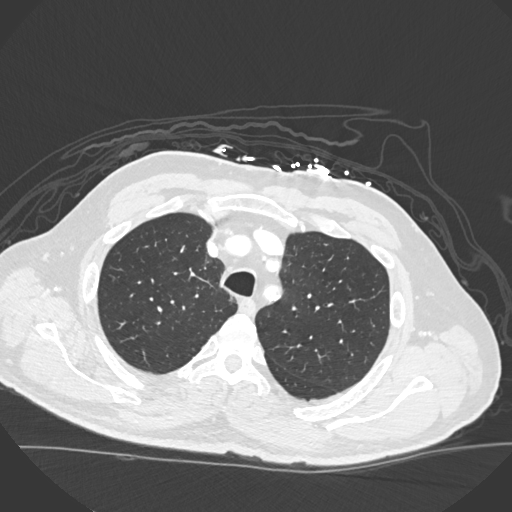
[im 237/321  soft-tissue]
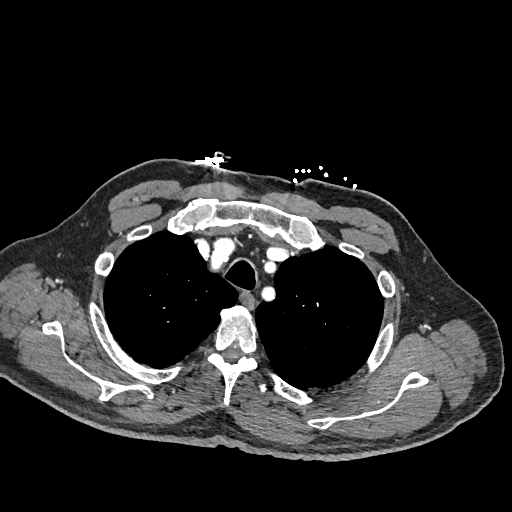
[im 265/321  lung]
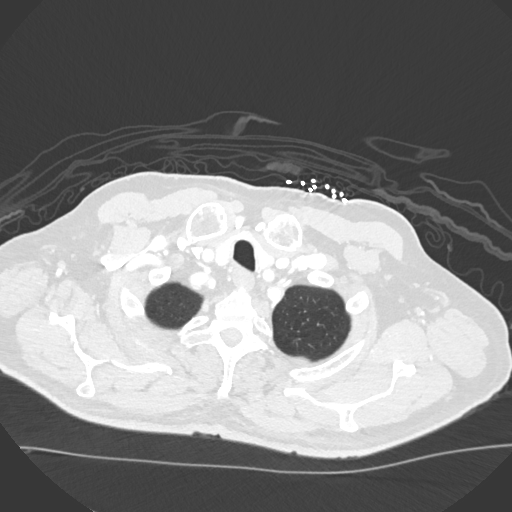
[im 279/321  soft-tissue]
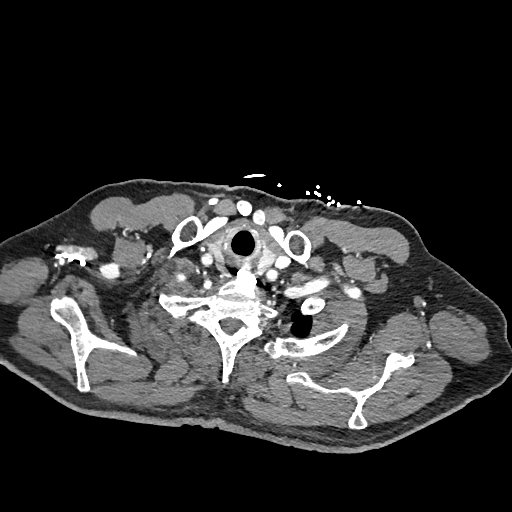
[im 307/321  lung]
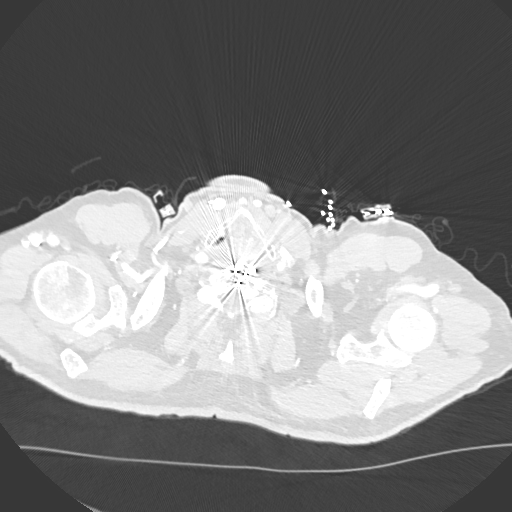

[Series 7: cor soft · coronal · 0.62mm/px · 3 of 155 slices shown]
[im 39/155  soft-tissue]
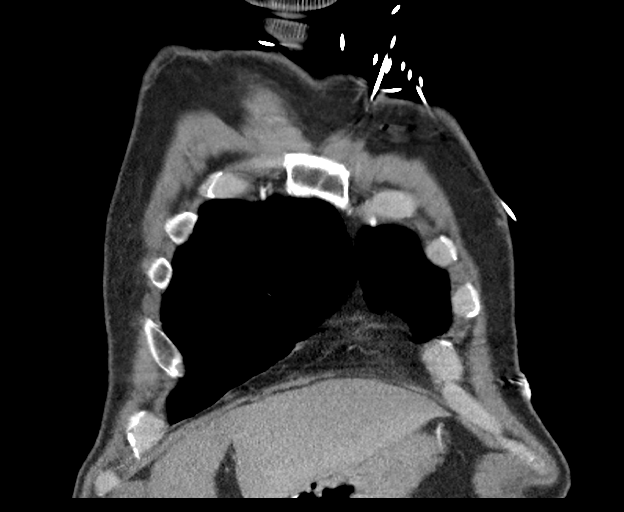
[im 78/155  soft-tissue]
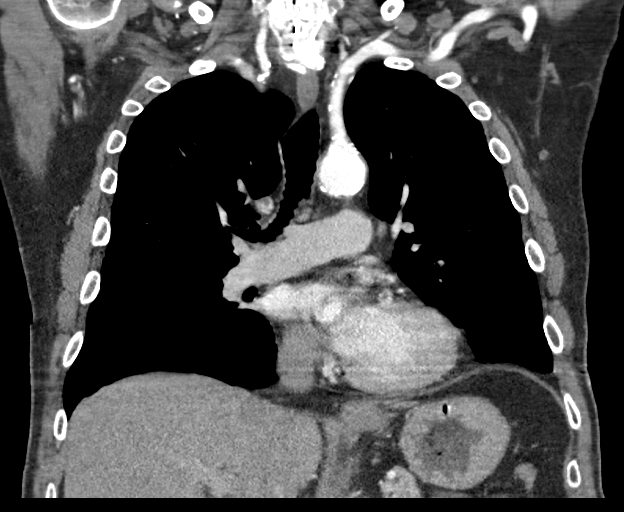
[im 116/155  soft-tissue]
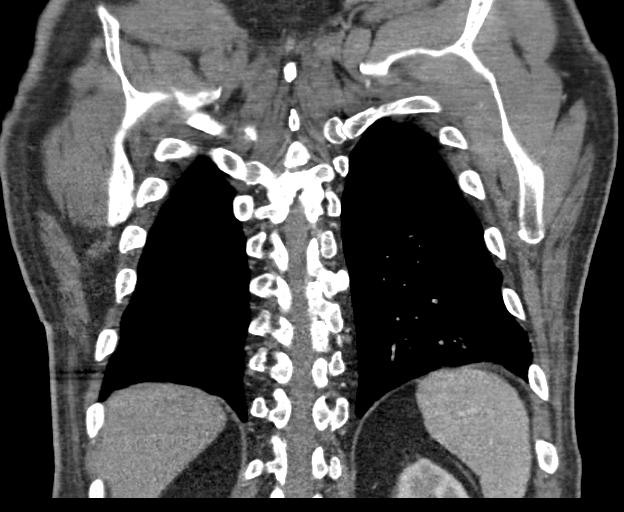

[18 of 46 positions shown; findings below may reference images not displayed]

FINDINGS: Cardiovascular: No filling defects within the pulmonary arteries to
suggest acute pulmonary embolism. Coronary artery calcification and
aortic atherosclerotic calcification.

Mediastinum/Nodes: No axillary or supraclavicular adenopathy. No
mediastinal or hilar adenopathy. No pericardial fluid. Esophagus
normal.

Lungs/Pleura: No pulmonary infarction. No pneumonia. Mild basilar
atelectasis. Airways normal.

Upper Abdomen: Limited view of the liver, kidneys, pancreas are
unremarkable. Normal adrenal glands.

Musculoskeletal: No aggressive osseous lesion.

Review of the MIP images confirms the above findings.
IMPRESSION: 1. No evidence acute pulmonary embolism.
2. No acute pulmonary parenchymal findings.
3. Coronary artery calcification and aortic Atherosclerosis
([id number]).

## 2022-01-17 ENCOUNTER — Other Ambulatory Visit: Payer: Self-pay

## 2022-01-17 ENCOUNTER — Encounter: Payer: Medicare HMO | Admitting: Acute Care

## 2022-01-18 ENCOUNTER — Ambulatory Visit (HOSPITAL_COMMUNITY): Payer: Medicare HMO

## 2022-01-18 ENCOUNTER — Telehealth: Payer: Self-pay | Admitting: Acute Care

## 2022-01-23 NOTE — Telephone Encounter (Signed)
Left message for pt to call back to reschedule shared decision visit and lung screening /ct.  ?

## 2022-01-24 DIAGNOSIS — I10 Essential (primary) hypertension: Secondary | ICD-10-CM | POA: Diagnosis not present

## 2022-01-24 DIAGNOSIS — J302 Other seasonal allergic rhinitis: Secondary | ICD-10-CM | POA: Diagnosis not present

## 2022-01-27 NOTE — Telephone Encounter (Signed)
Spoke with pt's wfie and rescheduled pt's appts for [facility] and LDCT. She verbalized understanding. Appt reminder mailed to pt.  ?

## 2022-03-01 ENCOUNTER — Encounter: Payer: Self-pay | Admitting: Acute Care

## 2022-03-01 ENCOUNTER — Ambulatory Visit (INDEPENDENT_AMBULATORY_CARE_PROVIDER_SITE_OTHER): Payer: Medicare HMO | Admitting: Acute Care

## 2022-03-01 DIAGNOSIS — F17210 Nicotine dependence, cigarettes, uncomplicated: Secondary | ICD-10-CM | POA: Diagnosis not present

## 2022-03-01 NOTE — Patient Instructions (Signed)
Thank you for participating in the [facility] [place] [place] Lung Cancer Screening Program. It was our pleasure to meet you today. We will call you with the results of your scan within the next few days. Your scan will be assigned a Lung RADS category score by the physicians reading the scans.  This Lung RADS score determines follow up scanning.  See below for description of categories, and follow up screening recommendations. We will be in touch to schedule your follow up screening annually or based on recommendations of our providers. We will fax a copy of your scan results to your Primary Care Physician, or the physician who referred you to the program, to ensure they have the results. Please call the office if you have any questions or concerns regarding your scanning experience or results.  Our office number is [phone]. Please speak with Denise Phelps, RN. , or  Denise Buckner RN, They are  our Lung Cancer Screening RN.'s If They are unavailable when you call, Please leave a message on the voice mail. We will return your call at our earliest convenience.This voice mail is monitored several times a day.  Remember, if your scan is normal, we will scan you annually as long as you continue to meet the criteria for the program. (Age 55-77, Current smoker or smoker who has quit within the last 15 years). If you are a smoker, remember, quitting is the single most powerful action that you can take to decrease your risk of lung cancer and other pulmonary, breathing related problems. We know quitting is hard, and we are here to help.  Please let us know if there is anything we can do to help you meet your goal of quitting. If you are a former smoker, congratulations. We are proud of you! Remain smoke free! Remember you can refer friends or family members through the number above.  We will screen them to make sure they meet criteria for the program. Thank you for helping us take better care of you by  participating in Lung Screening.  You can receive free nicotine replacement therapy ( patches, gum or mints) by calling 1-800-QUIT NOW. Please call so we can get you on the path to becoming  a non-smoker. I know it is hard, but you can do this!  Lung RADS Categories:  Lung RADS 1: no nodules or definitely non-concerning nodules.  Recommendation is for a repeat annual scan in 12 months.  Lung RADS 2:  nodules that are non-concerning in appearance and behavior with a very low likelihood of becoming an active cancer. Recommendation is for a repeat annual scan in 12 months.  Lung RADS 3: nodules that are probably non-concerning , includes nodules with a low likelihood of becoming an active cancer.  Recommendation is for a 6-month repeat screening scan. Often noted after an upper respiratory illness. We will be in touch to make sure you have no questions, and to schedule your 6-month scan.  Lung RADS 4 A: nodules with concerning findings, recommendation is most often for a follow up scan in 3 months or additional testing based on our provider's assessment of the scan. We will be in touch to make sure you have no questions and to schedule the recommended 3 month follow up scan.  Lung RADS 4 B:  indicates findings that are concerning. We will be in touch with you to schedule additional diagnostic testing based on our provider's  assessment of the scan.  Other options for assistance in smoking cessation (   As covered by your insurance benefits)  Hypnosis for smoking cessation  [employer] [phone]  Acupuncture for smoking cessation  [facility] [phone]   

## 2022-03-01 NOTE — Progress Notes (Signed)
Virtual Visit via Telephone Note ? ?I connected with Trevor Berg on 03/01/22 at 12:00 PM EDT by telephone and verified that I am speaking with the correct person using two identifiers. ? ?Location: ?Patient:  At home ?Provider: [facility], [place], [place], [address]  ?  ?I discussed the limitations, risks, security and privacy concerns of performing an evaluation and management service by telephone and the availability of in person appointments. I also discussed with the patient that there may be a patient responsible charge related to this service. The patient expressed understanding and agreed to proceed. ? ? ? ?Shared Decision Making Visit Lung Cancer Screening Program ?([phone]) ? ? ?Eligibility: ?Age 71 y.o. ?Pack Years Smoking History Calculation 100 pack years ?(# packs/per year x # years smoked) ?Recent History of coughing up blood  no ?Unexplained weight loss? no ?( >Than 15 pounds within the last 6 months ) ?Prior History Lung / other cancer no ?(Diagnosis within the last 5 years already requiring surveillance chest CT Scans). ?Smoking Status Current Smoker ?Former Smokers: Years since quit:  NA ? Quit Date:  NA ? ?Visit Components: ?Discussion included one or more decision making aids. yes ?Discussion included risk/benefits of screening. yes ?Discussion included potential follow up diagnostic testing for abnormal scans. yes ?Discussion included meaning and risk of over diagnosis. yes ?Discussion included meaning and risk of False Positives. yes ?Discussion included meaning of total radiation exposure. yes ? ?Counseling Included: ?Importance of adherence to annual lung cancer LDCT screening. yes ?Impact of comorbidities on ability to participate in the program. yes ?Ability and willingness to under diagnostic treatment. yes ? ?Smoking Cessation Counseling: ?Current Smokers:  ?Discussed importance of smoking cessation. yes ?Information about tobacco cessation classes and interventions provided to  patient. yes ?Patient provided with "ticket" for LDCT Scan. yes ?Symptomatic Patient. no ? Counseling NA ?Diagnosis Code: Tobacco Use Z72.0 ?Asymptomatic Patient yes ? Counseling (Intermediate counseling: > three minutes counseling) Y6503 ?Former Smokers:  ?Discussed the importance of maintaining cigarette abstinence. yes ?Diagnosis Code: Personal History of Nicotine Dependence. 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 Yes ?Patient provided with "ticket" for LDCT Scan. yes ?Written Order for Lung Cancer Screening with LDCT placed in Epic. Yes ?(CT Chest Lung Cancer Screening Low Dose W/O CM) [id number] ?Z12.2-Screening of respiratory organs ?Z87.891-Personal history of nicotine dependence ? ?I have spent 25 minutes of face to face/ virtual visit   time with  Trevor Berg discussing the risks and benefits of lung cancer screening. We viewed / discussed a power point together that explained in detail the above noted topics. We paused at intervals to allow for questions to be asked and answered to ensure understanding.We discussed that the single most powerful action that he can take to decrease his risk of developing lung cancer is to quit smoking. We discussed whether or not he is ready to commit to setting a quit date. We discussed options for tools to aid in quitting smoking including nicotine replacement therapy, non-nicotine medications, support groups, Quit Smart classes, and behavior modification. We discussed that often times setting smaller, more achievable goals, such as eliminating 1 cigarette a day for a week and then 2 cigarettes a day for a week can be helpful in slowly decreasing the number of cigarettes smoked. This allows for a sense of accomplishment as well as providing a clinical benefit. I provided  him  with smoking cessation  information  with contact information for community resources, classes, free nicotine  replacement therapy, and access to mobile  apps, text messaging, and on-line smoking cessation help. I have also provided  him  the office contact information in the event he needs to contact me, or the screening staff. We discussed the time and location of the scan, and that either Doroteo Glassman RN, Joella Prince, RN  or I will call / send a letter with the results within 24-72 hours of receiving them. The patient verbalized understanding of all of  the above and had no further questions upon leaving the office. They have my contact information in the event they have any further questions. ? ?I spent 3 minutes counseling on smoking cessation and the health risks of continued tobacco abuse. ? ?I explained to the patient that there has been a high incidence of coronary artery disease noted on these exams. I explained that this is a non-gated exam therefore degree or severity cannot be determined. This patient is on statin therapy. I have asked the patient to follow-up with their PCP regarding any incidental finding of coronary artery disease and management with diet or medication as their PCP  feels is clinically indicated. The patient verbalized understanding of the above and had no further questions upon completion of the visit. ? ?  ? ? ?Magdalen Spatz, NP ?03/01/2022 ? ? ? ? ? ? ?

## 2022-03-03 ENCOUNTER — Ambulatory Visit (HOSPITAL_COMMUNITY)
Admission: RE | Admit: 2022-03-03 | Discharge: 2022-03-03 | Disposition: A | Discharge status: Home / Self Care | Payer: Medicare HMO | Source: Ambulatory Visit | Attending: Acute Care | Admitting: Acute Care

## 2022-03-03 DIAGNOSIS — F17210 Nicotine dependence, cigarettes, uncomplicated: Secondary | ICD-10-CM | POA: Diagnosis not present

## 2022-03-03 DIAGNOSIS — Z87891 Personal history of nicotine dependence: Secondary | ICD-10-CM | POA: Diagnosis not present

## 2022-03-07 ENCOUNTER — Other Ambulatory Visit: Payer: Self-pay | Admitting: Acute Care

## 2022-03-07 DIAGNOSIS — F17210 Nicotine dependence, cigarettes, uncomplicated: Secondary | ICD-10-CM

## 2022-03-07 DIAGNOSIS — Z122 Encounter for screening for malignant neoplasm of respiratory organs: Secondary | ICD-10-CM

## 2022-03-07 DIAGNOSIS — Z87891 Personal history of nicotine dependence: Secondary | ICD-10-CM
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
## 2022-03-10 ENCOUNTER — Encounter: Payer: Self-pay | Admitting: Gastroenterology

## 2022-03-10 ENCOUNTER — Ambulatory Visit: Payer: Medicare HMO | Admitting: Gastroenterology

## 2022-03-10 VITALS — BP 142/74 | HR 68 | Temp 97.5°F | Ht 66.0 in | Wt 191.8 lb

## 2022-03-10 DIAGNOSIS — R1310 Dysphagia, unspecified: Secondary | ICD-10-CM | POA: Diagnosis not present

## 2022-03-10 DIAGNOSIS — K581 Irritable bowel syndrome with constipation: Secondary | ICD-10-CM | POA: Diagnosis not present

## 2022-03-10 DIAGNOSIS — K219 Gastro-esophageal reflux disease without esophagitis: Secondary | ICD-10-CM | POA: Diagnosis not present

## 2022-03-10 DIAGNOSIS — R1010 Upper abdominal pain, unspecified: Secondary | ICD-10-CM | POA: Diagnosis not present

## 2022-03-10 MED ORDER — RABEPRAZOLE SODIUM 20 MG PO TBEC: 20.0000 mg | DELAYED_RELEASE_TABLET | Freq: Two times a day (BID) | ORAL | 3 refills | Status: DC

## 2022-03-10 MED ORDER — TRULANCE 3 MG PO TABS: 3.0000 mg | ORAL_TABLET | Freq: Every day | ORAL | 5 refills | Status: DC

## 2022-03-10 NOTE — Patient Instructions (Addendum)
We will arrange for you to have a scan to evaluate the functionality of your gallbladder at [facility]. ? ?For reflux/heartburn: ?Stop omeprazole and start Aciphex 20 mg twice daily 30 minutes before breakfast and dinner. ?As we discussed, it is very important that you avoid foods that are high in fat or fried foods.  ?Meats should be lean (poultry or fish) and baked, boiled, or broiled. ?Continue to avoid spicy foods. ?Avoid carbonated beverages and caffeine. ? ?For constipation: ?We will see if we can get Trulance covered.  I have sent a new prescription to your pharmacy.  If the medication is too expensive, please call our office and let us know and we will work on this. ?While you are waiting to start Trulance, continue [name] 290 mcg daily and increase MiraLAX to 17 g twice daily in 8 ounces of water. ? ?We will plan to see you back in about 3 months.  Do not hesitate to call if you have questions or concerns prior to your next visit. ? ?It was good to see you again today! ? ?Aliene Altes, PA-C ?[facility] ? ?

## 2022-03-14 ENCOUNTER — Other Ambulatory Visit: Payer: Self-pay | Admitting: Gastroenterology

## 2022-03-14 DIAGNOSIS — K219 Gastro-esophageal reflux disease without esophagitis: Secondary | ICD-10-CM

## 2022-03-17 ENCOUNTER — Encounter (HOSPITAL_COMMUNITY)
Admission: RE | Admit: 2022-03-17 | Discharge: 2022-03-17 | Disposition: A | Discharge status: Home / Self Care | Payer: Medicare HMO | Source: Ambulatory Visit | Attending: Gastroenterology | Admitting: Gastroenterology

## 2022-03-17 ENCOUNTER — Encounter (HOSPITAL_COMMUNITY): Payer: Self-pay

## 2022-03-17 DIAGNOSIS — R1010 Upper abdominal pain, unspecified: Secondary | ICD-10-CM | POA: Insufficient documentation

## 2022-03-17 MED ORDER — TECHNETIUM TC 99M MEBROFENIN IV KIT
5.0000 | PACK | Freq: Once | INTRAVENOUS | Status: AC | PRN
  Administered 2022-03-17: 4.9 via INTRAVENOUS

## 2022-04-06 ENCOUNTER — Other Ambulatory Visit: Payer: Self-pay | Admitting: Internal Medicine

## 2022-04-12 ENCOUNTER — Other Ambulatory Visit: Payer: Self-pay | Admitting: Gastroenterology

## 2022-04-12 DIAGNOSIS — K219 Gastro-esophageal reflux disease without esophagitis: Secondary | ICD-10-CM

## 2022-04-26 DIAGNOSIS — N179 Acute kidney failure, unspecified: Secondary | ICD-10-CM | POA: Diagnosis not present

## 2022-05-04 ENCOUNTER — Other Ambulatory Visit: Payer: Self-pay | Admitting: Internal Medicine

## 2022-05-17 ENCOUNTER — Encounter: Payer: Self-pay | Admitting: Internal Medicine

## 2022-06-16 ENCOUNTER — Ambulatory Visit: Payer: Medicare HMO | Admitting: Internal Medicine

## 2022-06-19 ENCOUNTER — Encounter: Payer: Self-pay | Admitting: Allergy & Immunology

## 2022-06-19 ENCOUNTER — Ambulatory Visit: Payer: Medicare HMO | Admitting: Allergy & Immunology

## 2022-06-19 VITALS — BP 122/80 | HR 89 | Temp 98.1°F | Resp 16 | Ht 66.54 in | Wt 187.6 lb

## 2022-06-19 DIAGNOSIS — J3089 Other allergic rhinitis: Secondary | ICD-10-CM | POA: Diagnosis not present

## 2022-06-19 DIAGNOSIS — K9049 Malabsorption due to intolerance, not elsewhere classified: Secondary | ICD-10-CM | POA: Diagnosis not present

## 2022-06-19 DIAGNOSIS — J302 Other seasonal allergic rhinitis: Secondary | ICD-10-CM

## 2022-06-19 MED ORDER — AZELASTINE HCL 137 MCG/SPRAY NA SOLN: 1.0000 | Freq: Two times a day (BID) | NASAL | 5 refills | Status: DC

## 2022-06-19 MED ORDER — FLUTICASONE PROPIONATE 50 MCG/ACT NA SUSP: 1.0000 | Freq: Two times a day (BID) | NASAL | 5 refills | Status: DC

## 2022-06-19 MED ORDER — FEXOFENADINE HCL 180 MG PO TABS: 180.0000 mg | ORAL_TABLET | Freq: Every day | ORAL | 3 refills | Status: DC
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
## 2022-06-19 NOTE — Addendum Note (Signed)
Addended by: Valentina Shaggy on: 06/19/2022 12:31 PM   Modules accepted: Orders

## 2022-06-19 NOTE — Patient Instructions (Addendum)
1. Seasonal and perennial allergic rhinitis - Testing today showed: indoor molds, outdoor molds, dust mites, cat, and cockroach. - Copy of test results provided.  - Avoidance measures provided. - Stop taking: Claritin and Zyrtec - Continue with: Flonase (fluticasone) one spray per nostril TWICE daily (AIM FOR EAR ON EACH SIDE) - Start taking: Allegra (fexofenadine) '180mg'$  tablet once daily and Astelin (azelastine) 1 spray per nostril TWICE times daily  - You can use an extra dose of the antihistamine, if needed, for breakthrough symptoms.  - Consider nasal saline rinses 1-2 times daily to remove allergens from the nasal cavities as well as help with mucous clearance (this is especially helpful to do before the nasal sprays are given) - Consider allergy shots as a means of long-term control. - Allergy shots "re-train" and "reset" the immune system to ignore environmental allergens and decrease the resulting immune response to those allergens (sneezing, itchy watery eyes, runny nose, nasal congestion, etc).    - Allergy shots improve symptoms in 75-85% of patients.  - We can discuss more at the next appointment if the medications are not working for you.  2. Food intolerance - Testing was negative to for tomatoes, so this must be an intolerance.  - Continue to avoid these.  - I guess your wife gets all of the fresh tomatoes!   3. Return in about 4 weeks (around 07/17/2022).    Please inform us of any Emergency Department visits, hospitalizations, or changes in symptoms. Call us before going to the ED for breathing or allergy symptoms since we might be able to fit you in for a sick visit. Feel free to contact us anytime with any questions, problems, or concerns.  It was a pleasure to meet you and your bride today!  Websites that have reliable patient information: 1. American Academy of Asthma, Allergy, and Immunology: www.aaaai.org 2. Food Allergy Research and Education (FARE):  foodallergy.org 3. Mothers of Asthmatics: http://www.asthmacommunitynetwork.org 4. American College of Allergy, Asthma, and Immunology: www.acaai.org   COVID-19 Vaccine Information can be found at: [email or web address] For questions related to vaccine distribution or appointments, please email [email or web address] or call [phone].   We realize that you might be concerned about having an allergic reaction to the COVID19 vaccines. To help with that concern, WE ARE OFFERING THE COVID19 VACCINES IN OUR OFFICE! Ask the front desk for dates!     "Like" [place] on Facebook and Instagram for our latest updates!      A healthy democracy works best when [employer] participate! Make sure you are registered to vote! If you have moved or changed any of your contact information, you will need to get this updated before voting!  In some cases, you MAY be able to register to vote online: [email or web address]      Airborne Adult Perc - 06/19/22 0931     Time Antigen Placed 0931    Allergen Manufacturer Lavella Hammock    Location Back    Number of Test 59    1. Control-Buffer 50% Glycerol Negative    2. Control-Histamine 1 mg/ml 3+    3. Albumin saline Negative    4. Loyal Negative    5. [place] Negative    6. Johnson Negative    7. Monroe Blue Negative    8. Meadow Fescue Negative    9. Perennial Rye Negative    10. Sweet Vernal Negative    11. Timothy Negative    12. Cocklebur Negative    13. Burweed Marshelder Negative  14. Ragweed, short Negative    15. Ragweed, Giant Negative    16. Plantain,  English Negative    17. Lamb's Quarters Negative    18. Sheep Sorrell Negative    19. Rough Pigweed Negative    20. Marsh Elder, Rough Negative    21. Mugwort, Common Negative    22. Ash mix Negative    23. Birch mix Negative    24. Beech American Negative    25. Box, Elder Negative    26. Cedar, red  Negative    27. Cottonwood, [place] Negative    28. Elm mix Negative    29. Hickory Negative    30. Maple mix Negative    31. Oak, [place] mix Negative    32. Pecan Pollen Negative    33. Pine mix Negative    34. Sycamore Eastern Negative    35. [place], Black Pollen Negative    36. Alternaria alternata Negative    37. Cladosporium Herbarum Negative    38. Aspergillus mix Negative    39. Penicillium mix Negative    40. Bipolaris sorokiniana (Helminthosporium) Negative    41. Drechslera spicifera (Curvularia) Negative    42. Mucor plumbeus Negative    43. Fusarium moniliforme Negative    44. Aureobasidium pullulans (pullulara) Negative    45. Rhizopus oryzae Negative    46. Botrytis cinera Negative    47. Epicoccum nigrum Negative    48. Phoma betae Negative    49. Candida Albicans Negative    50. Trichophyton mentagrophytes Negative    51. Mite, D Farinae  5,000 AU/ml Negative    52. Mite, D Pteronyssinus  5,000 AU/ml Negative    53. Cat Hair 10,000 BAU/ml Negative    54.  Dog Epithelia Negative    55. Mixed Feathers Negative    56. Horse Epithelia Negative    57. Cockroach, German Negative    58. Mouse Negative    59. Tobacco Leaf Negative             Intradermal - 06/19/22 1000     Time Antigen Placed 1023    Allergen Manufacturer Lavella Hammock    Location Back    Number of Test 15    Control Negative    [place] Negative    Johnson Negative    7 Grass Negative    Ragweed mix Negative    Weed mix Negative    Tree mix Negative    Mold 1 1+    Mold 2 2+    Mold 3 2+    Mold 4 2+    Cat 1+    Dog Negative    Cockroach 1+    Mite mix 1+             Food Adult Perc - 06/19/22 0900     Time Antigen Placed 7619    Allergen Manufacturer Lavella Hammock    Location Back    Number of allergen test 1    42. Tomato Negative            Control of Mold Allergen   Mold and fungi can grow on a variety of surfaces provided certain temperature and moisture conditions  exist.  Outdoor molds grow on plants, decaying vegetation and soil.  The major outdoor mold, Alternaria and Cladosporium, are found in very high numbers during hot and dry conditions.  Generally, a late Summer - Fall peak is seen for common outdoor fungal spores.  Rain will temporarily lower outdoor mold spore  count, but counts rise rapidly when the rainy period ends.  The most important indoor molds are Aspergillus and Penicillium.  Dark, humid and poorly ventilated basements are ideal sites for mold growth.  The next most common sites of mold growth are the bathroom and the kitchen.  Outdoor (Seasonal) Mold Control  Positive outdoor molds via skin testing: Alternaria, Cladosporium, Bipolaris (Helminthsporium), Drechslera (Curvalaria), and Mucor  Use air conditioning and keep windows closed Avoid exposure to decaying vegetation. Avoid leaf raking. Avoid grain handling. Consider wearing a face mask if working in moldy areas.    Indoor (Perennial) Mold Control   Positive indoor molds via skin testing: Aspergillus, Penicillium, Fusarium, Aureobasidium (Pullulara), and Rhizopus  Maintain humidity below 50%. Clean washable surfaces with 5% bleach solution. Remove sources e.g. contaminated carpets.   Control of Cockroach Allergen  Cockroach allergen has been identified as an important cause of acute attacks of asthma, especially in urban settings.  There are fifty-five species of cockroach that exist in the [place], however only three, the [place], [place] species produce allergen that can affect patients with Asthma.  Allergens can be obtained from fecal particles, egg casings and secretions from cockroaches.    Remove food sources. Reduce access to water. Seal access and entry points. Spray runways with 0.5-1% Diazinon or Chlorpyrifos Blow boric acid power under stoves and refrigerator. Place bait stations (hydramethylnon) at feeding sites.   Control of Dog or Cat  Allergen  Avoidance is the best way to manage a dog or cat allergy. If you have a dog or cat and are allergic to dog or cats, consider removing the dog or cat from the home. If you have a dog or cat but don't want to find it a new home, or if your family wants a pet even though someone in the household is allergic, here are some strategies that may help keep symptoms at bay:  Keep the pet out of your bedroom and restrict it to only a few rooms. Be advised that keeping the dog or cat in only one room will not limit the allergens to that room. Don't pet, hug or kiss the dog or cat; if you do, wash your hands with soap and water. High-efficiency particulate air (HEPA) cleaners run continuously in a bedroom or living room can reduce allergen levels over time. Regular use of a high-efficiency vacuum cleaner or a central vacuum can reduce allergen levels. Giving your dog or cat a bath at least once a week can reduce airborne allergen.   Control of Dust Mite Allergen    Dust mites play a major role in allergic asthma and rhinitis.  They occur in environments with high humidity wherever human skin is found.  Dust mites absorb humidity from the atmosphere (ie, they do not drink) and feed on organic matter (including shed human and animal skin).  Dust mites are a microscopic type of insect that you cannot see with the naked eye.  High levels of dust mites have been detected from mattresses, pillows, carpets, upholstered furniture, bed covers, clothes, soft toys and any woven material.  The principal allergen of the dust mite is found in its feces.  A gram of dust may contain 1,000 mites and 250,000 fecal particles.  Mite antigen is easily measured in the air during house cleaning activities.  Dust mites do not bite and do not cause harm to humans, other than by triggering allergies/asthma.    Ways to decrease your exposure to dust  mites in your home:  Encase mattresses, box springs and pillows with a  mite-impermeable barrier or cover   Wash sheets, blankets and drapes weekly in hot water (130 F) with detergent and dry them in a dryer on the hot setting.  Have the room cleaned frequently with a vacuum cleaner and a damp dust-mop.  For carpeting or rugs, vacuuming with a vacuum cleaner equipped with a high-efficiency particulate air (HEPA) filter.  The dust mite allergic individual should not be in a room which is being cleaned and should wait 1 hour after cleaning before going into the room. Do not sleep on upholstered furniture (eg, couches).   If possible removing carpeting, upholstered furniture and drapery from the home is ideal.  Horizontal blinds should be eliminated in the rooms where the person spends the most time (bedroom, study, television room).  Washable vinyl, roller-type shades are optimal. Remove all non-washable stuffed toys from the bedroom.  Wash stuffed toys weekly like sheets and blankets above.   Reduce indoor humidity to less than 50%.  Inexpensive humidity monitors can be purchased at most hardware stores.  Do not use a humidifier as can make the problem worse and are not recommended.  Allergy Shots   Allergies are the result of a chain reaction that starts in the immune system. Your immune system controls how your body defends itself. For instance, if you have an allergy to pollen, your immune system identifies pollen as an invader or allergen. Your immune system overreacts by producing antibodies called Immunoglobulin E (IgE). These antibodies travel to cells that release chemicals, causing an allergic reaction.  The concept behind allergy immunotherapy, whether it is received in the form of shots or tablets, is that the immune system can be desensitized to specific allergens that trigger allergy symptoms. Although it requires time and patience, the payback can be long-term relief.  How Do Allergy Shots Work?  Allergy shots work much like a vaccine. Your body responds to  injected amounts of a particular allergen given in increasing doses, eventually developing a resistance and tolerance to it. Allergy shots can lead to decreased, minimal or no allergy symptoms.  There generally are two phases: build-up and maintenance. Build-up often ranges from three to six months and involves receiving injections with increasing amounts of the allergens. The shots are typically given once or twice a week, though more rapid build-up schedules are sometimes used.  The maintenance phase begins when the most effective dose is reached. This dose is different for each person, depending on how allergic you are and your response to the build-up injections. Once the maintenance dose is reached, there are longer periods between injections, typically two to four weeks.  Occasionally doctors give cortisone-type shots that can temporarily reduce allergy symptoms. These types of shots are different and should not be confused with allergy immunotherapy shots.  Who Can Be Treated with Allergy Shots?  Allergy shots may be a good treatment approach for people with allergic rhinitis (hay fever), allergic asthma, conjunctivitis (eye allergy) or stinging insect allergy.   Before deciding to begin allergy shots, you should consider:   The length of allergy season and the severity of your symptoms  Whether medications and/or changes to your environment can control your symptoms  Your desire to avoid long-term medication use  Time: allergy immunotherapy requires a major time commitment  Cost: may vary depending on your insurance coverage  Allergy shots for children age 15 and older are effective and often well tolerated.  They might prevent the onset of new allergen sensitivities or the progression to asthma.  Allergy shots are not started on patients who are pregnant but can be continued on patients who become pregnant while receiving them. In some patients with other medical conditions or who  take certain common medications, allergy shots may be of risk. It is important to mention other medications you talk to your allergist.   When Will I Feel Better?  Some may experience decreased allergy symptoms during the build-up phase. For others, it may take as long as 12 months on the maintenance dose. If there is no improvement after a year of maintenance, your allergist will discuss other treatment options with you.  If you aren't responding to allergy shots, it may be because there is not enough dose of the allergen in your vaccine or there are missing allergens that were not identified during your allergy testing. Other reasons could be that there are high levels of the allergen in your environment or major exposure to non-allergic triggers like tobacco smoke.  What Is the Length of Treatment?  Once the maintenance dose is reached, allergy shots are generally continued for three to five years. The decision to stop should be discussed with your allergist at that time. Some people may experience a permanent reduction of allergy symptoms. Others may relapse and a longer course of allergy shots can be considered.  What Are the Possible Reactions?  The two types of adverse reactions that can occur with allergy shots are local and systemic. Common local reactions include very mild redness and swelling at the injection site, which can happen immediately or several hours after. A systemic reaction, which is less common, affects the entire body or a particular body system. They are usually mild and typically respond quickly to medications. Signs include increased allergy symptoms such as sneezing, a stuffy nose or hives.  Rarely, a serious systemic reaction called anaphylaxis can develop. Symptoms include swelling in the throat, wheezing, a feeling of tightness in the chest, nausea or dizziness. Most serious systemic reactions develop within 30 minutes of allergy shots. This is why it is strongly  recommended you wait in your doctor's office for 30 minutes after your injections. Your allergist is trained to watch for reactions, and his or her staff is trained and equipped with the proper medications to identify and treat them.  Who Should Administer Allergy Shots?  The preferred location for receiving shots is your prescribing allergist's office. Injections can sometimes be given at another facility where the physician and staff are trained to recognize and treat reactions, and have received instructions by your prescribing allergist.

## 2022-07-17 ENCOUNTER — Ambulatory Visit: Payer: Medicare HMO | Admitting: Internal Medicine

## 2022-07-17 VITALS — BP 138/70 | HR 89 | Temp 98.7°F | Resp 16 | Ht 66.0 in | Wt 189.8 lb

## 2022-07-17 DIAGNOSIS — J3089 Other allergic rhinitis: Secondary | ICD-10-CM

## 2022-07-17 DIAGNOSIS — K9049 Malabsorption due to intolerance, not elsewhere classified: Secondary | ICD-10-CM

## 2022-07-17 DIAGNOSIS — H1013 Acute atopic conjunctivitis, bilateral: Secondary | ICD-10-CM

## 2022-07-17 MED ORDER — AZELASTINE HCL 137 MCG/SPRAY NA SOLN: 2.0000 | Freq: Two times a day (BID) | NASAL | 5 refills | Status: DC

## 2022-07-17 MED ORDER — FEXOFENADINE HCL 180 MG PO TABS: 180.0000 mg | ORAL_TABLET | Freq: Every day | ORAL | 1 refills | Status: DC

## 2022-07-17 MED ORDER — OLOPATADINE HCL 0.2 % OP SOLN: 1.0000 [drp] | Freq: Every day | OPHTHALMIC | 5 refills | Status: AC | PRN

## 2022-07-17 MED ORDER — FLUTICASONE PROPIONATE 50 MCG/ACT NA SUSP: 2.0000 | Freq: Every day | NASAL | 5 refills | Status: DC

## 2022-07-17 NOTE — Progress Notes (Addendum)
FOLLOW UP Date of Service/Encounter:  07/17/22   Subjective:  Trevor Berg (DOB: [birth year]) is a 71 y.o. male who returns to the [facility] on 07/17/2022 for follow up for allergies and food intolerance.  History obtained from: chart review and patient and wife.  Allergic Rhinitis/GERD Reports since last visit, he still continues to have congestion with both anterior and posterior drainage.  He also reports scaly itchy watery eyes since running out of his eye drops.  He is taking Flonase daily, Allegra 1 tablet as needed, olopatadine eyedrops as needed and nasal rinses as needed.  These do seem to help but are not completely controlling his symptoms.  He was supposed to start azelastine last visit but it does not sound like he did.   He also has noticed that his symptoms are worse when exposed to strong scent of laundry detergent; his washer was not working well so the laundry detergent was sticking to his clothes. He also has reflux and takes a daily Nexium which does help his symptoms.  Of note, he does have COPD and follows with Pulm.  He is on Anoro Ellipta daily and PRN albuterol. He is still smoking 1 pack per day; in the past, he has smoked 3 packs per day.  He is on Atenolol.   Food Intolerance: He continues to avoid fresh tomatoes but is able to tolerate foods with tomatoes cooked in it like pasta sauce/pizza.  Reports having a rash with fresh tomatoes when he was young and since then has avoided it.   Past Medical History: Past Medical History:  Diagnosis Date   Anginal pain ([facility])    Anxiety    Arthritis    both hands   Asthma    Chest pain 2005   noncritical coronary disease in 2005: 40% distal RCA, 30% CX, 50% OM 2, EF-60%,   COPD (chronic obstructive pulmonary disease) (HCC)    Chronic bronchitis; 100-pack-year cigarette consumption   Gastroesophageal reflux disease    possible small Schatzki's ring; esophageal dilatation in 2005   H. pylori infection  2014   treated with prevpac   Hyperlipidemia    Hypertension    Overweight(278.02)    Sleep apnea    Tobacco abuse    100 pack years    Objective:  BP 138/70   Pulse 89   Temp 98.7 F (37.1 C) (Temporal)   Resp 16   Ht '5\' 6"'$  (1.676 m)   Wt 189 lb 12.8 oz (86.1 kg)   SpO2 96%   BMI 30.63 kg/m  Body mass index is 30.63 kg/m. Physical Exam: GEN: alert, well developed HEENT: clear conjunctiva, TM grey and translucent, nose with moderate inferior turbinate hypertrophy, pink nasal mucosa, no rhinorrhea, + cobblestoning HEART: regular rate and rhythm, no murmur LUNGS: clear to auscultation bilaterally, no coughing, unlabored respiration SKIN: no rashes or lesions  Data Reviewed:  SPT: 05/2022; indoor molds, outdoor molds, dust mites, cat, and cockroach.    Assessment/Plan   1. Seasonal and perennial allergic rhinitis Allergic conjunctivitis - Positive testing to indoor molds, outdoor molds, dust mites, cat, and cockroach. - Continue nasal saline rinses 1-2 times daily to remove allergens from the nasal cavities as well as help with mucous clearance (this is especially helpful to do before the nasal sprays are given) - Continue Flonase (fluticasone) 2 sprays daily. - Continue Allegra '180mg'$  daily.  - Start Azelastine 2 sprays twice daily. - Continue Olopatadine 1 eye drop in each eye  daily as needed for itchy watery eyes.  2. Food intolerance - Testing was negative to for tomatoes, so this must be an intolerance.  - Okay to continue to avoid fresh tomatoes.   3. Return in about 6 months (around 01/15/2023).    Return in about 6 months (around 01/15/2023). Harlon Flor, MD  [facility]

## 2022-07-17 NOTE — Patient Instructions (Addendum)
1. Seasonal and perennial allergic rhinitis - Positive testing to indoor molds, outdoor molds, dust mites, cat, and cockroach. - Use nasal saline rinses 1-2 times daily to remove allergens from the nasal cavities as well as help with mucous clearance (this is especially helpful to do before the nasal sprays are given) - Continue Flonase (fluticasone) 2 sprays daily, Allegra '180mg'$  daily, Olopatadine 1 eye drop in each eye daily as needed for itchy watery eyes. - Start Azelastine 2 sprays twice daily.   2. Food intolerance - Testing was negative to for tomatoes, so this must be an intolerance.  - Continue to avoid these.   3. Return in about 6 months (around 01/15/2023).

## 2022-09-16 ENCOUNTER — Other Ambulatory Visit: Payer: Self-pay | Admitting: Internal Medicine

## 2022-09-18 ENCOUNTER — Other Ambulatory Visit: Payer: Self-pay | Admitting: Internal Medicine

## 2022-09-18 ENCOUNTER — Other Ambulatory Visit: Payer: Self-pay

## 2022-09-18 DIAGNOSIS — K581 Irritable bowel syndrome with constipation: Secondary | ICD-10-CM

## 2022-09-18 MED ORDER — TRULANCE 3 MG PO TABS: 3.0000 mg | ORAL_TABLET | Freq: Every day | ORAL | 5 refills | Status: DC

## 2022-09-23 NOTE — Progress Notes (Signed)
Cardiology Office Note:    Date:  09/25/2022   ID:  Trevor Berg, DOB [birth year], MRN [id number]  PCP:  Trevor Jewel, MD   [facility] [facility] Cardiologist:  Trevor Dolly, MD     Referring MD: Trevor Jewel, MD   CC: Here for follow-up  History of Present Illness:    Trevor Berg is a 71 y.o. male with a hx of the following:  Chronic, atypical CP HTN HLD COPD GERD IBS Tobacco abuse Bilateral iliac artery aneurysms  Long history of atypical chest pain and chronic shortness of breath.  Previous cardiovascular history includes cardiac cath in 2005 that revealed mild to moderate, nonobstructive CAD.  Stress test in 2013 was negative for any ischemia, EF 55 to 60%.  Repeat Lexiscan performed in 2016 that revealed inferior defect due to subdiaphragmatic attenuation, was negative for ischemia.  EGD performed in 2018 revealed multiple erosions of the stomach.   Most recently, he had stress test, echocardiogram, and ABIs arranged.  Stress test in May 2022 was considered low risk, ABIs were normal in 2020.  Echocardiogram in June 2022 revealed EF 65 to 70%, trivial MR, no RWMA, moderate calcification of aortic valve with moderate thickening, no AR or AS.  CT of abdomen was arranged by Dr. Gala Berg that was performed in May 2022 that showed no significant proximal vessel arterial occlusive disease, aortoiliac sclerosis with 2.5 cm right and 2.3 cm left common iliac artery fusiform aneurysm, was referred to Dr. Curt Berg, [facility].  Was referred to pulmonary by Trevor Berg at follow-up in June 2022 for continued [facility].   Last seen by Dr. Carlyle Berg on September 30, 2021.  He reported chest pain at times that was sharp.  Noted shortness of breath with Imdur, stated this medication has not helped chest pain since starting.  Imdur was DC'd at last office visit.  EKG revealed sinus rhythm without ischemic changes.  His blood pressure was noted to be low at home at times,  lisinopril was lowered to 20 mg daily.  Today he presents for follow-up.  He states he is doing well; however, states he does have atypical chest pain, not associated with exertion.  Difficult for him due to describe, described as sharp, lasts only few seconds in duration.  States pain is relieved while watching TV, nothing seems to make it worse.  Also points to epigastric area/upper abdomen and reports pain here, does admit to significant increase in GERD symptoms.  He does follow GI doctor, and wife who is present in the room and admits to dietary indiscretions.  Breathing is stable.  Denies any palpitations, syncope, presyncope, dizziness, orthopnea, PND, any acute bleeding, or claudication.  Does have swelling along bilateral lower extremities at the end of the day, does resolve in the morning.  Blood pressure is well-controlled at home.  Continues to smoke, currently smoking half pack per day.  Wife states he used to smoke up to 3 packs/day.  Denies any other questions or concerns.   Past Medical History:  Diagnosis Date   Anginal pain ([place])    Anxiety    Arthritis    both hands   Asthma    Chest pain 2005   noncritical coronary disease in 2005: 40% distal RCA, 30% CX, 50% OM 2, EF-60%,   COPD (chronic obstructive pulmonary disease) (HCC)    Chronic bronchitis; 100-pack-year cigarette consumption   Gastroesophageal reflux disease    possible small Schatzki's ring; esophageal dilatation in  2005   H. 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   COLONOSCOPY N/A 09/09/2015   RMR: Pancolonic diverticulosis. Multiple rectal polyps and 1 polyp in the ascending colon. Pathology with hyperplastic rectal polyps and tubular adenoma colon polyp. Repeat in 2021.   COLONOSCOPY WITH PROPOFOL N/A 09/14/2020   Surgeon: Daneil Dolin, MD;  diverticulosis, otherwise normal exam, recommended repeat in 7 years.   ESOPHAGEAL DILATION N/A 09/09/2015   Procedure: ESOPHAGEAL DILATION;  Surgeon: Daneil Dolin, MD;  [facility];  Service: Endoscopy;  Laterality: N/A;   ESOPHAGOGASTRODUODENOSCOPY  09/27/2004   RMR:   A couple of tiny, distal esophageal erosions, otherwise normal esophagus aside from a Schatzki ring status post Maloney dilation as described above/ Small hiatal hernia, otherwise normal stomach, normal D1-D2   ESOPHAGOGASTRODUODENOSCOPY N/A 09/09/2015   Procedure: ESOPHAGOGASTRODUODENOSCOPY (EGD);  Surgeon: Daneil Dolin, MD;  [facility];  Service: Endoscopy;  Laterality: N/A;   ESOPHAGOGASTRODUODENOSCOPY N/A 04/19/2017   RMR: Normal esophagus. Dilated. Erosive gastropathy. Biopsied. Otherwise normal. Pathology with mild chronic gastritis. No H. pylori   ESOPHAGOGASTRODUODENOSCOPY (EGD) WITH ESOPHAGEAL DILATION N/A 03/19/2013   QPY:PPJKDT esophagus s/p dilation/Abnormal stomach of uncertain clinical significance-s/p bx positive for H. pylori, status post Prevpac treatment   ESOPHAGOGASTRODUODENOSCOPY (EGD) WITH PROPOFOL N/A 10/02/2019   Surgeon: Daneil Dolin, MD; normal esophagus dilated, normal stomach and examined duodenum.   ESOPHAGOGASTRODUODENOSCOPY (EGD) WITH PROPOFOL N/A 11/10/2021   Procedure: ESOPHAGOGASTRODUODENOSCOPY (EGD) WITH PROPOFOL;  Surgeon: Daneil Dolin, MD;  [facility];  Service: Endoscopy;  Laterality: N/A;  2:45pm   KNEE ARTHROSCOPY WITH MEDIAL MENISECTOMY Right 12/06/2017   Procedure: RIGHT KNEE ARTHROSCOPY WITH PARTIAL MEDIAL MENISECTOMY;  REMOVAL OF LOOSE BODY;  Surgeon: Carole Civil, MD;  Location: [facility];  Service: Orthopedics;  Laterality: Right;   MALONEY DILATION N/A 04/19/2017   Procedure: Venia Minks DILATION;  Surgeon: Daneil Dolin, MD;  [facility];  Service: Endoscopy;  Laterality: N/A;   MALONEY DILATION N/A 10/02/2019   Procedure: Venia Minks DILATION;  Surgeon: Daneil Dolin, MD;  [facility];  Service: Endoscopy;  Laterality: N/A;   MALONEY DILATION N/A 11/10/2021   Procedure: Venia Minks DILATION;  Surgeon: Daneil Dolin, MD;  [facility];  Service: Endoscopy;  Laterality: N/A;   ORIF ANKLE FRACTURE     right   ORIF TIBIA FRACTURE     Left   TONSILLECTOMY      Current Medications: Current Meds  Medication Sig   albuterol (PROVENTIL HFA;VENTOLIN HFA) 108 (90 BASE) MCG/ACT inhaler Inhale 2 puffs into the lungs every 6 (six) hours as needed for shortness of breath.    amLODipine (NORVASC) 2.5 MG tablet Take 2.5 mg by mouth as needed (If BP is >125-130).   aspirin EC 81 MG tablet Take 81 mg by mouth daily.   atenolol (TENORMIN) 50 MG tablet TAKE ONE TABLET BY MOUTH ONCE DAILY  IN THE MORNING (Patient taking differently: Take 50 mg by mouth as needed (BP >125-130).)   Azelastine HCl 137 MCG/SPRAY SOLN Place 2 sprays into both nostrils 2 (two) times daily.   Cholecalciferol (VITAMIN D3) 1000 units CAPS Take 1 capsule by mouth daily.   dextromethorphan-guaiFENesin (MUCINEX DM) 30-600 MG 12hr tablet Take 1 tablet by mouth 2 (two) times daily.   esomeprazole (NEXIUM) 40 MG capsule TAKE 1 CAPSULE (40 MG TOTAL) BY MOUTH 2 (TWO) TIMES DAILY BEFORE A MEAL.   fexofenadine (ALLEGRA ALLERGY) 180 MG tablet Take 1 tablet (180 mg total) by mouth daily.   fluticasone (FLONASE) 50 MCG/ACT nasal spray Place 2 sprays into both nostrils daily.   hydrochlorothiazide (MICROZIDE) 12.5 MG capsule TAKE 1 CAPSULE BY MOUTH ONCE A DAY FOR HTN/SWELLING   LORazepam (ATIVAN) 1 MG tablet TAKE 1 TABLET BY  MOUTH 3 TIMES A DAY AS NEEDED   nitroGLYCERIN (NITROSTAT) 0.4 MG SL tablet Place 0.4 mg under the tongue every 5 (five) minutes as needed for chest pain. Chest Pain   Olopatadine HCl (PATADAY) 0.2 % SOLN Place 1 drop into both eyes daily as needed (itchy watery eyes).   Plecanatide (TRULANCE) 3 MG TABS Take 3 mg by mouth daily at 6 (six) AM.   polyethylene glycol powder (GLYCOLAX/MIRALAX) powder USE 1 CAPFUL DAILY AS NEEDED FOR CONSTIPATION   sertraline (ZOLOFT) 50 MG tablet Take 50 mg by mouth daily.   simvastatin (ZOCOR) 20 MG tablet Take 20 mg by mouth daily.   tiZANidine (ZANAFLEX) 2 MG tablet Take 2 mg by mouth every 8 (eight) hours as needed.   traZODone (DESYREL) 50 MG tablet Take 50 mg by mouth at bedtime.   umeclidinium-vilanterol (ANORO ELLIPTA) 62.5-25 MCG/INH AEPB Inhale 1 puff into the lungs daily.   vitamin B-12 (CYANOCOBALAMIN) 1000 MCG tablet Take 1,000 mcg by mouth daily.   zinc gluconate 50 MG tablet Take 50 mg by mouth daily.     Allergies:   Chantix [varenicline] and Tomato   Social History   Socioeconomic History   Marital status: Married    Spouse name: Not on file   Number of children: 3   Years of education: Not on file   Highest education level: Not on file  Occupational History   Occupation: disability  Tobacco Use   Smoking status: Every Day    Packs/day: 2.00    Years: 50.00    Total pack years: 100.00    Types: Cigarettes    Passive exposure: Never   Smokeless tobacco: Never   Tobacco comments:    1 pack per day as of 08/2012  Vaping Use   Vaping Use: Never used  Substance and Sexual Activity   Alcohol use: No    Alcohol/week: 0.0 standard drinks of alcohol    Comment: occasionally, usually holidays but sometimes on weekends, prior heavy use   Drug use: No   Sexual activity: Yes    Birth control/protection: None  Other Topics Concern   Not on file  Social History Narrative   Not on file   Social Determinants of Health   Financial  Resource Strain: Not on file  Food Insecurity: Not on file  Transportation Needs: Not on file  Physical Activity: Not on file  Stress: Not on file  Social Connections: Not on file     Family History: The patient's family history includes Diabetes in his brother; Heart attack in his mother; Hypertension in an other family member; Ulcers in his brother. There is  no history of Colon cancer.  ROS:   Review of Systems  Constitutional: Negative.   HENT: Negative.    Eyes: Negative.   Respiratory: Negative.    Cardiovascular:  Positive for chest pain and leg swelling. Negative for palpitations, orthopnea, claudication and PND.       See HPI.  Gastrointestinal:  Positive for abdominal pain and heartburn. Negative for blood in stool, constipation, diarrhea, melena, nausea and vomiting.       See HPI.  Genitourinary: Negative.   Musculoskeletal: Negative.   Skin: Negative.   Neurological: Negative.   Endo/Heme/Allergies: Negative.   Psychiatric/Behavioral: Negative.      Please see the history of present illness.    All other systems reviewed and are negative.  EKGs/Labs/Other Studies Reviewed:    The following studies were reviewed today:   EKG:  EKG is ordered today.  The ekg ordered today demonstrates NSR, 87 bpm, LAFB, otherwise no acute ischemic changes.    ABIs on 04/14/2021: Right: Resting right ankle-brachial index indicates noncompressible right  lower extremity arteries. The right toe-brachial index is normal.   Left: Resting left ankle-brachial index indicates noncompressible left  lower extremity arteries. The left toe-brachial index is normal.  2D Complete Echo on 04/14/2021:  1. Left ventricular ejection fraction, by estimation, is 65 to 70%. The  left ventricle has normal function. The left ventricle has no regional  wall motion abnormalities. Left ventricular diastolic parameters were  normal. The average left ventricular  global longitudinal strain is -20.1 %.  The global longitudinal strain is  normal.   2. Right ventricular systolic function is normal. The right ventricular  size is normal.   3. The mitral valve is normal in structure. Trivial mitral valve  regurgitation. No evidence of mitral stenosis.   4. The aortic valve has an indeterminant number of cusps. There is  moderate calcification of the aortic valve. There is moderate thickening  of the aortic valve. Aortic valve regurgitation is not visualized. No  aortic stenosis is present.   5. The inferior vena cava is normal in size with greater than 50%  respiratory variability, suggesting right atrial pressure of 3 mmHg.   Comparison(s): Stress echocr=ardiogram done 10/17/12 showed an EF of 70%.   Lexiscan on 03/08/2021: No diagnostic ST segment changes to indicate ischemia. Small, moderate intensity, apical to basal inferior defect that is fixed and suggestive of scar although normal wall motion suggests possibility of diaphragmatic attenuation as well. No definitive ischemic territories are noted. This is a low risk study. Nuclear stress EF: 71%.   Recent Labs: 11/07/2021: BUN 15; Creatinine, Ser 1.13; Potassium 3.9; Sodium 138  Recent Lipid Panel No results found for: "CHOL", "TRIG", "HDL", "CHOLHDL", "VLDL", "LDLCALC", "LDLDIRECT"   Physical Exam:    VS:  BP 120/80 (BP Location: Right Arm, Patient Position: Sitting, Cuff Size: Normal)   Pulse 92   Ht '5\' 6"'$  (1.676 m)   Wt 192 lb (87.1 kg)   SpO2 94%   BMI 30.99 kg/m     Wt Readings from Last 3 Encounters:  09/25/22 192 lb (87.1 kg)  07/17/22 189 lb 12.8 oz (86.1 kg)  06/19/22 187 lb 9.6 oz (85.1 kg)     GEN: Well nourished, well developed in no acute distress HEENT: Normal NECK: No JVD; No carotid bruits CARDIAC: S1/S2, RRR, no murmurs, rubs, gallops; 2+ peripheral pulses throughout, strong and equal bilaterally RESPIRATORY:  Clear and diminished to auscultation without rales, wheezing or rhonchi  ABDOMEN: Soft,  generalized tenderness to palpation, distended MUSCULOSKELETAL:  No edema; No deformity  SKIN: Thin skin, warm and dry NEUROLOGIC:  Alert and oriented x 3 PSYCHIATRIC:  Normal affect   ASSESSMENT:    1. Atypical chest pain   2. LAFB (left anterior fascicular block)   3. Chronic obstructive pulmonary disease, unspecified COPD type ([place])   4. Shortness of breath   5. Essential hypertension   6. Mixed hyperlipidemia   7. Bilateral iliac artery aneurysm (HCC)   8. Tobacco abuse   9. Gastroesophageal reflux disease, unspecified whether esophagitis present   10. Edema, unspecified type    PLAN:    In order of problems listed above:  Atypical chest pain Very atypical in presentation, similar to last OV in 09/2021 with Dr. Carlyle Berg. Has long standing hx of similar atypical CP and chronic SOB, unchanged. Has not taken NG. Last Lexiscan performed in 2022 was considered low risk without definitive ischemic territories noted. No acute ischemic changes seen on 12 lead ECG today; however, LAFB noted. Continue current medication regimen. Heart healthy diet and regular cardiovascular exercise as tolerated encouraged. Will obtain CBC, CMET, and FLP. ED precautions discussed.   Left anterior fascicular block on EKG Noted on EKG today; however, appears he had this on 12 lead ECG 1 year ago. Will obtain TTE to evaluate for any WMA. If WMA present and chest pain has not resolved, will bring pt back in for follow-up and consider repeating Lexiscan or consider further ischemic workup. Continue current medication regimen. Will obtain the following lab work as mentioned above.   COPD, shortness of breath Stable, chronic shortness of breath. No recent worsening shortness of breath or exacerbations noted. Continue to follow up with Pulmonology. Continue current medication regimen. Continue to follow with PCP.  HTN Blood pressure today 120/80.  Wife and patient state BP is well-controlled at home.   Continue amlodipine as needed, atenolol, and hydrochlorothiazide.  Will obtain lab work as mentioned above. Heart healthy diet and regular cardiovascular exercise as tolerated encouraged.   HLD Labs from December 2022 revealed total cholesterol 135, HDL 40, LDL 74, and triglycerides 116.  Continue simvastatin.  We will obtain FLP and CMET as mentioned above as he is due for these labs to be rechecked. Heart healthy diet and regular cardiovascular exercise encouraged.   Bilateral iliac artery aneurysms CT of abdomen in May 2022 revealed 2.5 cm right and 2.3 cm left common iliac artery fusiform aneurysm, was previously referred to Dr. Sherren Mocha Early with [facility], it has been over 2 years since he was last seen in office.  Will refer again to Dr. Sherren Mocha Early with [facility]. Continue to follow with PCP.  Tobacco abuse Currently smoking 1/2 pack/day.  Wife states he is cut down his smoking, used to smoke 3 packs/day.  Smoking cessation encouraged and discussed.  Will initiate nicotine patch 14 mg daily x 30 days, and patient will start when he is ready to quit smoking.  He will call in and request for refill when needed.  GERD Does admit to recent dietary indiscretions with frequent reports of heartburn recently.  Continue Nexium.  Will initiate famotidine 20 mg twice daily x 6 weeks.  Continue to follow-up with GI and PCP.     9. Lower extremity edema Wife notes he has lower extremity swelling after being on his feet all day.  I discussed and encouraged wearing compression stockings.  Will write prescription for knee high compression stockings bilaterally, 15 to 20 mmHg of  compression recommended and ordered.   10. Disposition: Follow-up with me or Dr. Carlyle Berg in 6 months or sooner if anything changes.   Medication Adjustments/Labs and Tests Ordered: Current medicines are reviewed at length with the patient today.  Concerns regarding medicines are outlined above.  Orders Placed This Encounter  Procedures    Elastic bandage   Lipid panel   Comprehensive metabolic panel   CBC   Ambulatory referral to Vascular Surgery   EKG 12-Lead   ECHOCARDIOGRAM COMPLETE   Meds ordered this encounter  Medications   nicotine (NICODERM CQ - DOSED IN MG/24 HOURS) 14 mg/24hr patch    Sig: Place 1 patch (14 mg total) onto the skin daily.    Dispense:  28 patch    Refill:  0   famotidine (PEPCID) 20 MG tablet    Sig: Take 1 tablet (20 mg total) by mouth 2 (two) times daily.    Dispense:  60 tablet    Refill:  1    09/25/22 New Start    Patient Instructions  Medication Instructions:  Your physician has recommended you make the following change in your medication:  Start Nicoderm 14 mg once a day for 30 days (PLEASE CALL THE OFFICE IF YOU WISH TO REFILL) Start Pepcid 20 mg twice a day for 6 weeks-Continue follow up with gastroenterologist  Continue all other medications as directed  Labwork: FLP, CMET, CBC today at [facility]  Testing/Procedures: Your physician has requested that you have an echocardiogram. Echocardiography is a painless test that uses sound waves to create images of your heart. It provides your doctor with information about the size and shape of your heart and how well your heart's chambers and valves are working. This procedure takes approximately one hour. There are no restrictions for this procedure. Please do NOT wear cologne, perfume, aftershave, or lotions (deodorant is allowed). Please arrive 15 minutes prior to your appointment time.   Follow-Up:  Your physician recommends that you schedule a follow-up appointment in: 6 months  Any Other Special Instructions Will Be Listed Below (If Applicable).  You have been referred to Vascular (Dr. Donnetta Hutching)  If you need a refill on your cardiac medications before your next appointment, please call your pharmacy.    SignedFinis Bud, NP  09/25/2022 11:57 AM    [place]

## 2022-09-25 ENCOUNTER — Ambulatory Visit: Payer: Medicare HMO | Attending: Nurse Practitioner | Admitting: Nurse Practitioner

## 2022-09-25 ENCOUNTER — Telehealth: Payer: Self-pay | Admitting: Nurse Practitioner

## 2022-09-25 ENCOUNTER — Encounter: Payer: Self-pay | Admitting: Nurse Practitioner

## 2022-09-25 ENCOUNTER — Other Ambulatory Visit (HOSPITAL_COMMUNITY)
Admission: RE | Admit: 2022-09-25 | Discharge: 2022-09-25 | Disposition: A | Discharge status: Home / Self Care | Payer: Medicare HMO | Source: Ambulatory Visit | Attending: Nurse Practitioner | Admitting: Nurse Practitioner

## 2022-09-25 VITALS — BP 120/80 | HR 92 | Ht 66.0 in | Wt 192.0 lb

## 2022-09-25 DIAGNOSIS — E782 Mixed hyperlipidemia: Secondary | ICD-10-CM

## 2022-09-25 DIAGNOSIS — I10 Essential (primary) hypertension: Secondary | ICD-10-CM

## 2022-09-25 DIAGNOSIS — R609 Edema, unspecified: Secondary | ICD-10-CM | POA: Diagnosis not present

## 2022-09-25 DIAGNOSIS — K219 Gastro-esophageal reflux disease without esophagitis: Secondary | ICD-10-CM

## 2022-09-25 DIAGNOSIS — I444 Left anterior fascicular block: Secondary | ICD-10-CM | POA: Diagnosis not present

## 2022-09-25 DIAGNOSIS — R0789 Other chest pain: Secondary | ICD-10-CM | POA: Diagnosis not present

## 2022-09-25 DIAGNOSIS — I723 Aneurysm of iliac artery: Secondary | ICD-10-CM

## 2022-09-25 DIAGNOSIS — E875 Hyperkalemia: Secondary | ICD-10-CM

## 2022-09-25 DIAGNOSIS — Z720 Tobacco use: Secondary | ICD-10-CM | POA: Diagnosis not present

## 2022-09-25 DIAGNOSIS — R0602 Shortness of breath: Secondary | ICD-10-CM | POA: Diagnosis not present

## 2022-09-25 DIAGNOSIS — J449 Chronic obstructive pulmonary disease, unspecified: Secondary | ICD-10-CM

## 2022-09-25 LAB — CBC
HCT: 46.7 % (ref 39.0–52.0)
Hemoglobin: 15.3 g/dL (ref 13.0–17.0)
MCH: 30.5 pg (ref 26.0–34.0)
MCHC: 32.8 g/dL (ref 30.0–36.0)
MCV: 93.2 fL (ref 80.0–100.0)
Platelets: 246 10*3/uL (ref 150–400)
RBC: 5.01 MIL/uL (ref 4.22–5.81)
RDW: 13.7 % (ref 11.5–15.5)
WBC: 8.3 10*3/uL (ref 4.0–10.5)
nRBC: 0 % (ref 0.0–0.2)

## 2022-09-25 LAB — COMPREHENSIVE METABOLIC PANEL
ALT: 16 U/L (ref 0–44)
AST: 22 U/L (ref 15–41)
Albumin: 4.5 g/dL (ref 3.5–5.0)
Alkaline Phosphatase: 80 U/L (ref 38–126)
Anion gap: 9 (ref 5–15)
BUN: 16 mg/dL (ref 8–23)
CO2: 26 mmol/L (ref 22–32)
Calcium: 9.7 mg/dL (ref 8.9–10.3)
Chloride: 106 mmol/L (ref 98–111)
Creatinine, Ser: 1.17 mg/dL (ref 0.61–1.24)
GFR, Estimated: >60 mL/min (ref >60)
Glucose, Bld: 105 mg/dL — ABNORMAL HIGH (ref 70–99)
Potassium: 5.8 mmol/L — ABNORMAL HIGH (ref 3.5–5.1)
Sodium: 141 mmol/L (ref 135–145)
Total Bilirubin: 0.6 mg/dL (ref 0.3–1.2)
Total Protein: 7.4 g/dL (ref 6.5–8.1)

## 2022-09-25 LAB — LIPID PANEL
Cholesterol: 191 mg/dL (ref 0–200)
HDL: 50 mg/dL (ref >40)
LDL Cholesterol: 110 mg/dL — ABNORMAL HIGH (ref 0–99)
Total CHOL/HDL Ratio: 3.8 RATIO
Triglycerides: 157 mg/dL — ABNORMAL HIGH (ref <150)
VLDL: 31 mg/dL (ref 0–40)

## 2022-09-25 MED ORDER — NICOTINE 14 MG/24HR TD PT24: 14.0000 mg | MEDICATED_PATCH | Freq: Every day | TRANSDERMAL | 0 refills | Status: DC

## 2022-09-25 MED ORDER — FAMOTIDINE 20 MG PO TABS: 20.0000 mg | ORAL_TABLET | Freq: Two times a day (BID) | ORAL | 1 refills | Status: DC

## 2022-09-25 NOTE — Telephone Encounter (Signed)
lmtcb

## 2022-09-25 NOTE — Patient Instructions (Addendum)
Medication Instructions:  Your physician has recommended you make the following change in your medication:  Start Nicoderm 14 mg once a day for 30 days (PLEASE CALL THE OFFICE IF YOU WISH TO REFILL) Start Pepcid 20 mg twice a day for 6 weeks-Continue follow up with gastroenterologist  Continue all other medications as directed  Labwork: FLP, CMET, CBC today at [facility]  Testing/Procedures: Your physician has requested that you have an echocardiogram. Echocardiography is a painless test that uses sound waves to create images of your heart. It provides your doctor with information about the size and shape of your heart and how well your heart's chambers and valves are working. This procedure takes approximately one hour. There are no restrictions for this procedure. Please do NOT wear cologne, perfume, aftershave, or lotions (deodorant is allowed). Please arrive 15 minutes prior to your appointment time.   Follow-Up:  Your physician recommends that you schedule a follow-up appointment in: 6 months  Any Other Special Instructions Will Be Listed Below (If Applicable).  You have been referred to Vascular (Dr. Donnetta Hutching)  If you need a refill on your cardiac medications before your next appointment, please call your pharmacy.

## 2022-09-25 NOTE — Telephone Encounter (Signed)
Called pt several times to update him about lab results today and could not reach him.   Lipid panel revealed elevated LDL at 110 and would like this to be < 100. I would like to increase Simvastatin to 40 mg daily to help lower his LDL. We will recheck a FLP and LFT in 2 months with this change.   Kidney function came back normal, but potassium level came back at 5.8. He is not on any medication that should be causing this. I would like to check this rechecked at some point tomorrow (11/28) and get this rechecked with PCP or at hospital. He will need to avoid any potassium supplements or foods (such as spinach, bananas, avocados, yogurt, or sweet potatoes.   Will route this note to my CMA [facility]) who will make patient aware of these changes.   Finis Bud, NP

## 2022-09-26 ENCOUNTER — Other Ambulatory Visit: Payer: Self-pay | Admitting: Nurse Practitioner

## 2022-09-26 ENCOUNTER — Other Ambulatory Visit (HOSPITAL_COMMUNITY)
Admission: RE | Admit: 2022-09-26 | Discharge: 2022-09-26 | Disposition: A | Discharge status: Home / Self Care | Payer: Medicare HMO | Source: Ambulatory Visit | Attending: Cardiology | Admitting: Cardiology

## 2022-09-26 DIAGNOSIS — J449 Chronic obstructive pulmonary disease, unspecified: Secondary | ICD-10-CM

## 2022-09-26 DIAGNOSIS — K219 Gastro-esophageal reflux disease without esophagitis: Secondary | ICD-10-CM

## 2022-09-26 DIAGNOSIS — E875 Hyperkalemia: Secondary | ICD-10-CM | POA: Insufficient documentation

## 2022-09-26 DIAGNOSIS — R0789 Other chest pain: Secondary | ICD-10-CM

## 2022-09-26 DIAGNOSIS — I723 Aneurysm of iliac artery: Secondary | ICD-10-CM

## 2022-09-26 DIAGNOSIS — E782 Mixed hyperlipidemia: Secondary | ICD-10-CM

## 2022-09-26 DIAGNOSIS — I444 Left anterior fascicular block: Secondary | ICD-10-CM

## 2022-09-26 DIAGNOSIS — Z720 Tobacco use: Secondary | ICD-10-CM

## 2022-09-26 DIAGNOSIS — R0602 Shortness of breath: Secondary | ICD-10-CM

## 2022-09-26 DIAGNOSIS — R609 Edema, unspecified: Secondary | ICD-10-CM

## 2022-09-26 DIAGNOSIS — I10 Essential (primary) hypertension: Secondary | ICD-10-CM

## 2022-09-26 LAB — BASIC METABOLIC PANEL
Anion gap: 8 (ref 5–15)
BUN: 19 mg/dL (ref 8–23)
CO2: 25 mmol/L (ref 22–32)
Calcium: 9.2 mg/dL (ref 8.9–10.3)
Chloride: 108 mmol/L (ref 98–111)
Creatinine, Ser: 1.09 mg/dL (ref 0.61–1.24)
GFR, Estimated: >60 mL/min (ref >60)
Glucose, Bld: 108 mg/dL — ABNORMAL HIGH (ref 70–99)
Potassium: 4.8 mmol/L (ref 3.5–5.1)
Sodium: 141 mmol/L (ref 135–145)

## 2022-09-26 MED ORDER — SIMVASTATIN 40 MG PO TABS
40.0000 mg | ORAL_TABLET | Freq: Every day | ORAL | 1 refills | Status: AC
Start: 2022-09-26 — End: ?

## 2022-09-26 NOTE — Telephone Encounter (Signed)
Patients wife made aware. States that patient will go to [facility] for lab work either this evening or first thing in the morning. Simvastatin 40 mg sent to [facility]. Instructed to take 2 of the 20 mg tablets until they are gone.

## 2022-09-26 NOTE — Addendum Note (Signed)
Addended by: Sung Amabile on: 09/26/2022 11:22 AM   Modules accepted: Orders

## 2022-09-28 ENCOUNTER — Other Ambulatory Visit: Payer: Medicare HMO

## 2022-10-09 ENCOUNTER — Ambulatory Visit: Payer: Medicare HMO

## 2022-10-20 ENCOUNTER — Other Ambulatory Visit: Payer: Self-pay | Admitting: Nurse Practitioner

## 2022-10-26 ENCOUNTER — Ambulatory Visit: Payer: Medicare HMO | Attending: Nurse Practitioner

## 2022-10-26 ENCOUNTER — Other Ambulatory Visit: Payer: Self-pay | Admitting: Nurse Practitioner

## 2022-10-26 DIAGNOSIS — R0789 Other chest pain: Secondary | ICD-10-CM

## 2022-10-26 LAB — ECHOCARDIOGRAM COMPLETE
AR max vel: 1.81 cm2
AV Area VTI: 2.12 cm2
AV Area mean vel: 2.07 cm2
AV Mean grad: 5.6 mmHg
AV Peak grad: 11.7 mmHg
Ao pk vel: 1.71 m/s
Area-P 1/2: 3.77 cm2
Calc EF: 63.8 %
S' Lateral: 3.5 cm
Single Plane A2C EF: 64.9 %
Single Plane A4C EF: 61.3 %

## 2022-11-22 ENCOUNTER — Other Ambulatory Visit: Payer: Self-pay | Admitting: Nurse Practitioner

## 2022-12-16 ENCOUNTER — Other Ambulatory Visit: Payer: Self-pay | Admitting: Internal Medicine

## 2022-12-18 ENCOUNTER — Other Ambulatory Visit: Payer: Self-pay | Admitting: Internal Medicine

## 2022-12-29 ENCOUNTER — Other Ambulatory Visit: Payer: Self-pay | Admitting: *Deleted

## 2022-12-29 MED ORDER — FAMOTIDINE 20 MG PO TABS: 20.0000 mg | ORAL_TABLET | Freq: Two times a day (BID) | ORAL | 6 refills | Status: DC

## 2023-01-24 ENCOUNTER — Other Ambulatory Visit: Payer: Self-pay | Admitting: Acute Care

## 2023-01-24 DIAGNOSIS — Z122 Encounter for screening for malignant neoplasm of respiratory organs: Secondary | ICD-10-CM

## 2023-01-24 DIAGNOSIS — F17210 Nicotine dependence, cigarettes, uncomplicated: Secondary | ICD-10-CM

## 2023-01-24 DIAGNOSIS — Z87891 Personal history of nicotine dependence: Secondary | ICD-10-CM

## 2023-02-14 ENCOUNTER — Other Ambulatory Visit: Payer: Self-pay | Admitting: Allergy & Immunology

## 2023-03-19 ENCOUNTER — Ambulatory Visit (HOSPITAL_COMMUNITY): Admission: RE | Admit: 2023-03-19 | Payer: Medicare HMO | Source: Ambulatory Visit

## 2023-03-22 ENCOUNTER — Encounter: Payer: Self-pay | Admitting: Cardiology

## 2023-03-22 ENCOUNTER — Encounter: Payer: Self-pay | Admitting: *Deleted

## 2023-03-22 ENCOUNTER — Ambulatory Visit: Payer: Medicare HMO | Attending: Cardiology | Admitting: Cardiology

## 2023-03-22 VITALS — BP 150/75 | HR 71 | Ht 66.0 in | Wt 182.0 lb

## 2023-03-22 DIAGNOSIS — I10 Essential (primary) hypertension: Secondary | ICD-10-CM | POA: Diagnosis not present

## 2023-03-22 DIAGNOSIS — I723 Aneurysm of iliac artery: Secondary | ICD-10-CM | POA: Diagnosis not present

## 2023-03-22 DIAGNOSIS — R0789 Other chest pain: Secondary | ICD-10-CM | POA: Diagnosis not present

## 2023-03-22 DIAGNOSIS — E782 Mixed hyperlipidemia: Secondary | ICD-10-CM | POA: Diagnosis not present

## 2023-03-22 NOTE — Patient Instructions (Signed)
Medication Instructions:  Continue all current medications.   Labwork: none  Testing/Procedures: none  Follow-Up: 6 months   Any Other Special Instructions Will Be Listed Below (If Applicable).  You have been referred to:  Vascular   If you need a refill on your cardiac medications before your next appointment, please call your pharmacy.  

## 2023-03-22 NOTE — Progress Notes (Signed)
Clinical Summary Trevor Berg is a 72 y.o.male seen today for follow up of the following medical problems.     1.CAD - long history of chest pain - cath in 2005 without mild to moderate non-obstructive disease - negative stress echo 09/2012, baseline LVEF 55-60%  - 07/2015 Lexiscan was ordered due to chest pain. It showed an inferior defect due to subdiaphragmatic attenuation, no ischemia.   -  03/2017 EGD with multiple erosions of stomach     02/2021 nuclear stress: no ischemia 03/2021 echo LVEF 65-70%, normal diastolic function, normal RV, no significant valve dysfunction - imdur causes SOB, has not helped chest pain since starting.   - mild increase in chest pain - tight feeling epigastric/upper abdomen. Can occur with activity or at rest. Some nausea. Can last few days. Not positional.  - extensive prior workup with GI - compliant with meds    2. COPD - compliant with inhalers   - chronic SOB unchaged   3. HTN - reports pcp stopped one of his bp meds.  - home bp typically 110s/60s - has not taken meds yet today - reports compared home cuff to pcp's and was accurate.    4. Hyperlipidemia Labs followed by pcp - he is on simvastatin  08/2022 TC 191 TG 157 HDL 50 LDL 110 (simva was increased to 40mg )     5. AAA screen - history of smoking, male age 30  - 08/2017 no aneurysm  6. Bilateral iliac aneurysm   SH: works as a [employer]or, [employer]   Past Medical History:  Diagnosis Date   Anginal pain (HCC)    Anxiety    Arthritis    both hands   Asthma    Chest pain 2005   noncritical coronary disease in 2005: 40% distal RCA, 30% CX, 50% OM 2, EF-60%,   COPD (chronic obstructive pulmonary disease) (HCC)    Chronic bronchitis; 100-pack-year cigarette consumption   Gastroesophageal reflux disease    possible small Schatzki's ring; esophageal dilatation in 2005   H. 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   Tomato Rash     Current Outpatient Medications  Medication Sig Dispense Refill   albuterol (PROVENTIL HFA;VENTOLIN HFA) 108 (90 BASE) MCG/ACT inhaler Inhale 2 puffs into the lungs every 6 (six) hours as needed for shortness of breath.      amLODipine (NORVASC) 2.5 MG tablet Take 2.5 mg by mouth as needed (If BP is >125-130).     aspirin EC 81 MG tablet Take 81 mg by mouth daily.     atenolol (TENORMIN) 50 MG tablet TAKE ONE TABLET BY MOUTH ONCE DAILY IN THE MORNING (Patient taking differently: Take 50 mg by mouth as needed (BP >125-130).) 30 tablet 3   Azelastine HCl 137 MCG/SPRAY SOLN Place 2 sprays into both nostrils 2 (two) times daily. 30 mL 5   Cholecalciferol (VITAMIN D3) 1000 units CAPS Take 1 capsule by mouth daily.     dextromethorphan-guaiFENesin (MUCINEX DM) 30-600 MG 12hr tablet Take 1 tablet by mouth 2 (two) times daily.     esomeprazole (NEXIUM) 40 MG capsule TAKE 1 CAPSULE (40 MG  TOTAL) BY MOUTH 2 (TWO) TIMES DAILY BEFORE A MEAL. 60 capsule 5   famotidine (PEPCID) 20 MG tablet Take 1 tablet (20 mg total) by mouth 2 (two) times daily. 60 tablet 6   fexofenadine (ALLEGRA ALLERGY) 180 MG tablet Take 1 tablet (180 mg total) by mouth daily. 90 tablet 1   fluticasone (FLONASE) 50 MCG/ACT nasal spray Place 2 sprays into both nostrils daily. 16 g 5   hydrochlorothiazide (MICROZIDE) 12.5 MG capsule TAKE 1 CAPSULE BY MOUTH ONCE A DAY FOR HTN/SWELLING  3   LORazepam (ATIVAN) 1 MG tablet TAKE 1 TABLET BY MOUTH 3 TIMES A DAY AS NEEDED 30 tablet 0   nicotine (NICODERM CQ - DOSED IN MG/24 HOURS) 14 mg/24hr patch Place 1 patch (14 mg total) onto the skin daily. 28 patch 0   nitroGLYCERIN (NITROSTAT) 0.4 MG SL tablet Place 0.4 mg under the tongue every 5 (five) minutes as  needed for chest pain. Chest Pain     Olopatadine HCl (PATADAY) 0.2 % SOLN Place 1 drop into both eyes daily as needed (itchy watery eyes). 2.5 mL 5   Plecanatide (TRULANCE) 3 MG TABS Take 3 mg by mouth daily at 6 (six) AM. 30 tablet 5   polyethylene glycol powder (GLYCOLAX/MIRALAX) powder USE 1 CAPFUL DAILY AS NEEDED FOR CONSTIPATION 527 g 11   sertraline (ZOLOFT) 50 MG tablet Take 50 mg by mouth daily.     simvastatin (ZOCOR) 40 MG tablet Take 1 tablet (40 mg total) by mouth daily. 90 tablet 1   tiZANidine (ZANAFLEX) 2 MG tablet Take 2 mg by mouth every 8 (eight) hours as needed.     traZODone (DESYREL) 50 MG tablet Take 50 mg by mouth at bedtime.  0   umeclidinium-vilanterol (ANORO ELLIPTA) 62.5-25 MCG/INH AEPB Inhale 1 puff into the lungs daily. 7 each 0   VASCEPA 1 g capsule Take 2 g by mouth 2 (two) times daily. (Patient not taking: Reported on 07/17/2022)     vitamin B-12 (CYANOCOBALAMIN) 1000 MCG tablet Take 1,000 mcg by mouth daily.     zinc gluconate 50 MG tablet Take 50 mg by mouth daily.     No current facility-administered medications for this visit.     Past Surgical History:  Procedure Laterality Date   CERVICAL SPINE SURGERY     C5-6 and C6-7: Relief of spinal stenosis with corpectomy, foraminotomy and fusion   COLONOSCOPY  10/19/2004   RMR:   Friable anal canal, likely source of patient's hematochezia/  Otherwise normal rectum, normal colon.   COLONOSCOPY N/A 09/09/2015   RMR: Pancolonic diverticulosis. Multiple rectal polyps and 1 polyp in the ascending colon. Pathology with hyperplastic rectal polyps and tubular adenoma colon polyp. Repeat in 2021.   COLONOSCOPY WITH PROPOFOL N/A 09/14/2020   Surgeon: Corbin Ade, MD;  diverticulosis, otherwise normal exam, recommended repeat in 7 years.   ESOPHAGEAL DILATION N/A 09/09/2015   Procedure: ESOPHAGEAL DILATION;  Surgeon: Corbin Ade, MD;  Location: [facility];  Service: Endoscopy;  Laterality: N/A;    ESOPHAGOGASTRODUODENOSCOPY  09/27/2004   RMR:   A couple of tiny, distal esophageal erosions, otherwise normal esophagus aside from a Schatzki ring status post Maloney dilation as described above/ Small hiatal hernia, otherwise normal stomach, normal D1-D2   ESOPHAGOGASTRODUODENOSCOPY N/A 09/09/2015   Procedure: ESOPHAGOGASTRODUODENOSCOPY (EGD);  Surgeon: Corbin Ade, MD;  Location: [facility];  Service: Endoscopy;  Laterality: N/A;   ESOPHAGOGASTRODUODENOSCOPY N/A 04/19/2017   RMR: Normal esophagus. Dilated. Erosive  gastropathy. Biopsied. Otherwise normal. Pathology with mild chronic gastritis. No H. pylori   ESOPHAGOGASTRODUODENOSCOPY (EGD) WITH ESOPHAGEAL DILATION N/A 03/19/2013   [id number] esophagus s/p dilation/Abnormal stomach of uncertain clinical significance-s/p bx positive for H. pylori, status post Prevpac treatment   ESOPHAGOGASTRODUODENOSCOPY (EGD) WITH PROPOFOL N/A 10/02/2019   Surgeon: Corbin Ade, MD; normal esophagus dilated, normal stomach and examined duodenum.   ESOPHAGOGASTRODUODENOSCOPY (EGD) WITH PROPOFOL N/A 11/10/2021   Procedure: ESOPHAGOGASTRODUODENOSCOPY (EGD) WITH PROPOFOL;  Surgeon: Corbin Ade, MD;  Location: [facility];  Service: Endoscopy;  Laterality: N/A;  2:45pm   KNEE ARTHROSCOPY WITH MEDIAL MENISECTOMY Right 12/06/2017   Procedure: RIGHT KNEE ARTHROSCOPY WITH PARTIAL MEDIAL MENISECTOMY; REMOVAL OF LOOSE BODY;  Surgeon: Vickki Hearing, MD;  Location: [facility];  Service: Orthopedics;  Laterality: Right;   MALONEY DILATION N/A 04/19/2017   Procedure: Elease Hashimoto DILATION;  Surgeon: Corbin Ade, MD;  Location: [facility];  Service: Endoscopy;  Laterality: N/A;   MALONEY DILATION N/A 10/02/2019   Procedure: Elease Hashimoto DILATION;  Surgeon: Corbin Ade, MD;  Location: [facility];  Service: Endoscopy;  Laterality: N/A;   MALONEY DILATION N/A 11/10/2021   Procedure: Elease Hashimoto DILATION;  Surgeon: Corbin Ade, MD;  Location: [facility];   Service: Endoscopy;  Laterality: N/A;   ORIF ANKLE FRACTURE     right   ORIF TIBIA FRACTURE     Left   TONSILLECTOMY       Allergies  Allergen Reactions   Chantix [Varenicline]     Nightmares and personality changes.   Tomato Rash      Family History  Problem Relation Age of Onset   Heart attack Mother        deceased, age 68s   Ulcers Brother    Diabetes Brother    Hypertension Other    Colon cancer Neg Hx      Social History Trevor Berg reports that he has been smoking cigarettes. He has a 100.00 pack-year smoking history. He has never been exposed to tobacco smoke. He has never used smokeless tobacco. Trevor Berg reports no history of alcohol use.   Review of Systems CONSTITUTIONAL: No weight loss, fever, chills, weakness or fatigue.  HEENT: Eyes: No visual loss, blurred vision, double vision or yellow sclerae.No hearing loss, sneezing, congestion, runny nose or sore throat.  SKIN: No rash or itching.  CARDIOVASCULAR: per hpi RESPIRATORY: No shortness of breath, cough or sputum.  GASTROINTESTINAL: No anorexia, nausea, vomiting or diarrhea. No abdominal pain or blood.  GENITOURINARY: No burning on urination, no polyuria NEUROLOGICAL: No headache, dizziness, syncope, paralysis, ataxia, numbness or tingling in the extremities. No change in bowel or bladder control.  MUSCULOSKELETAL: No muscle, back pain, joint pain or stiffness.  LYMPHATICS: No enlarged nodes. No history of splenectomy.  PSYCHIATRIC: No history of depression or anxiety.  ENDOCRINOLOGIC: No reports of sweating, cold or heat intolerance. No polyuria or polydipsia.  Marland Kitchen   Physical Examination Today's Vitals   03/22/23 0817  BP: (!) 158/80  Pulse: 71  SpO2: 96%  Weight: 182 lb (82.6 kg)  Height: 5\' 6"  (1.676 m)   Body mass index is 29.38 kg/m.  Gen: resting comfortably, no acute distress HEENT: no scleral icterus, pupils equal round and reactive, no palptable cervical adenopathy,  CV: RRR, no  mrg, no jvd Resp: Clear to auscultation bilaterally GI: abdomen is soft, non-tender, non-distended, normal bowel sounds, no hepatosplenomegaly MSK: extremities are warm, no edema.  Skin: warm, no rash Neuro:  no focal deficits Psych: appropriate affect   Diagnostic Studies   07/2004 Cath The left main is a large vessel with no significant disease.    The LAD is a medium size vessel which courses three quarters of the way down  the anterior wall and has no significant disease.  There are two small  diagonal branches with no significant disease.    The left circumflex is a large vessel coursing through the AV groove.  The  third obtuse marginal Roderica Cathell is free of the AV groove circumflex and noted  to have a mild 30% narrowing after the takeoff the second OM.    The first OM is a small vessel which bifurcates in the proximal portion with  no significant disease.    The second OM is a large vessel which bifurcates distally and has 50% mid  vessel stenosis.    The third OM is a small vessel with no significant disease.    The right coronary artery is a large vessel which is dominant and gives rise  to both the PDA as well as the posterolateral Barett Whidbee.  The RCA is ectatic  but has no high grade stenosis.    The PDA is a medium size vessel with no significant disease.    The PLA is a large vessel which bifurcates in the mid segment. There is 40%  lesion in the proximal portion fo the lower bifurcation.    Left ventriculogram reveals a preserved ejection fraction of 60%.    Hemodynamics:  The systemic arterial pressure was 112/69.  Left ventricular  pressure was 115/8.  Left ventricular end diastolic pressure was 13.    CONCLUSION:  1.  Non-critical coronary artery disease.  2.  Normal left ventricular systolic function.     09/2012 Stress echo Study Conclusions  - Stress: There was resting hypertension with a hypertensive   response to stress. Atypical chest pain was  present before   and during exercise without increase in intensity, and it   persisted following conclusion of the test. - Stress ECG conclusions: No diagnostic ST abnormalities or   arrhythmias. The stress ECG was negative for ischemia.   [facility] scoring: exercise time of ; maximum ST deviation   of 0mm; angina present but did not limit exercise;   resulting score is -1. This score predicts a moderate risk   of cardiac events. - Staged echo: There was no echocardiographic evidence for   stress-induced ischemia. - Peak stress: LV global systolic function was vigorous. The   estimated LV ejection fraction was 70% to 75%. No evidence   for new LV regional wall motion abnormalities.     07/2015 Nuclear stress test There was no ST segment deviation noted during stress. Defect 1: There is a medium defect of mild severity present in the basal inferior, mid inferior and apical inferior location, which is likely due to soft tissue attenuation artifact. A mild degree of myocardial scar cannot entirely be ruled out. This is a low risk study. Nuclear stress EF: 62%.     02/2021 nuclear stress No diagnostic ST segment changes to indicate ischemia. Small, moderate intensity, apical to basal inferior defect that is fixed and suggestive of scar although normal wall motion suggests possibility of diaphragmatic attenuation as well. No definitive ischemic territories are noted. This is a low risk study. Nuclear stress EF: 71%.       03/2021 echo IMPRESSIONS     1. Left ventricular ejection fraction, by estimation, is  65 to 70%. The  left ventricle has normal function. The left ventricle has no regional  wall motion abnormalities. Left ventricular diastolic parameters were  normal. The average left ventricular  global longitudinal strain is -20.1 %. The global longitudinal strain is  normal.   2. Right ventricular systolic function is normal. The right ventricular  size is normal.   3. The  mitral valve is normal in structure. Trivial mitral valve  regurgitation. No evidence of mitral stenosis.   4. The aortic valve has an indeterminant number of cusps. There is  moderate calcification of the aortic valve. There is moderate thickening  of the aortic valve. Aortic valve regurgitation is not visualized. No  aortic stenosis is present.   5. The inferior vena cava is normal in size with greater than 50%  respiratory variability, suggesting right atrial pressure of 3 mmHg.   09/2022 echo 1. Left ventricular ejection fraction, by estimation, is 60 to 65%. The  left ventricle has normal function. The left ventricle has no regional  wall motion abnormalities. Left ventricular diastolic parameters are  consistent with Grade I diastolic  dysfunction (impaired relaxation). The average left ventricular global  longitudinal strain is -23.0 %. The global longitudinal strain is normal.   2. Right ventricular systolic function is normal. The right ventricular  size is normal. Tricuspid regurgitation signal is inadequate for assessing  PA pressure.   3. The mitral valve is normal in structure. No evidence of mitral valve  regurgitation. No evidence of mitral stenosis.   4. The aortic valve has an indeterminant number of cusps. There is mild  calcification of the aortic valve. There is mild thickening of the aortic  valve. Aortic valve regurgitation is not visualized. No aortic stenosis is  present.   5. The inferior vena cava is normal in size with greater than 50%  respiratory variability, suggesting right atrial pressure of 3 mmHg.    Assessment and Plan   1.Chest pain/SOB - long history of atypical chest pain and chronic SOB, symptoms overall unchanged over several years - negative ischemic evaluations in the past including cath in 2005, multiple benign lexiscans including most recently 02/2021 - more recently upper abdominal pains, epigastric pain. Extensive GI workup was benign -  no cardiac chest pains by description - no further cardiac testing indicated at this time - continue to monitor   2. HTN - elevated here but home numbers consistently at goal, continue current meds   3. HLD - request labs from pcp  4.Iliac artery aneurysms - incidental finding on 2022 CT A/P, refer to vacular to help monitor    Antoine Poche, M.D.

## 2023-04-03 ENCOUNTER — Other Ambulatory Visit: Payer: Self-pay | Admitting: Allergy & Immunology

## 2023-04-19 ENCOUNTER — Other Ambulatory Visit: Payer: Self-pay | Admitting: *Deleted

## 2023-04-19 DIAGNOSIS — I714 Abdominal aortic aneurysm, without rupture, unspecified: Secondary | ICD-10-CM

## 2023-04-30 NOTE — Progress Notes (Signed)
 [facility]  ASSESSMENT / PLAN: 72 y.o. male with 29 mm right common iliac artery aneurysm and 26 mm left common iliac artery aneurysm.  These are presently asymptomatic.    Recommend:  Abstinence from all tobacco products. Blood glucose control with goal A1c < 7%. Blood pressure control with goal blood pressure < 140/90 mmHg. Lipid reduction therapy with goal LDL-C <100 mg/dL  Aspirin 81mg  PO QD.  Atorvastatin 40-80mg  PO QD (or other "high intensity" statin therapy).  We will reimage his abdominal aorta and iliac arteries in 1 year with duplex ultrasound.  Counseled him to present to care to develop sudden severe abdominal or lower back pain.  CHIEF COMPLAINT: Common iliac artery aneurysms  HISTORY OF PRESENT ILLNESS: Trevor Berg is a 72 y.o. 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  The patient is asymptomatic from an aneurysm standpoint.  He does report chronic epigastric and subcostal abdominal pain for which she is undergoing GI evaluation.  He denies any back pain.  We reviewed the natural history of aneurysmal disease.  We reviewed the rationale for surveillance.  Past Medical History:  Diagnosis Date   Anginal pain (HCC)    Anxiety    Arthritis    both hands   Asthma    Chest pain 2005   noncritical coronary disease in 2005: 40% distal RCA, 30% CX, 50% OM 2, EF-60%,   COPD (chronic obstructive pulmonary disease) (HCC)    Chronic bronchitis; 100-pack-year cigarette consumption   Gastroesophageal reflux disease    possible small Schatzki's ring; esophageal dilatation in 2005   H. 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   COLONOSCOPY N/A 09/09/2015   RMR: Pancolonic diverticulosis. Multiple rectal polyps and 1 polyp in the ascending colon. Pathology with hyperplastic rectal polyps and tubular adenoma colon polyp. Repeat in 2021.   COLONOSCOPY WITH PROPOFOL N/A 09/14/2020   Surgeon: Corbin Ade, MD;  diverticulosis, otherwise normal exam, recommended repeat in 7 years.   ESOPHAGEAL DILATION N/A 09/09/2015   Procedure: ESOPHAGEAL DILATION;  Surgeon: Corbin Ade, MD;  [facility];  Service: Endoscopy;  Laterality: N/A;   ESOPHAGOGASTRODUODENOSCOPY  09/27/2004   RMR:   A couple of tiny, distal esophageal erosions, otherwise normal esophagus aside from a Schatzki ring status post Maloney dilation as described above/ Small hiatal hernia, otherwise normal stomach, normal D1-D2   ESOPHAGOGASTRODUODENOSCOPY N/A 09/09/2015   Procedure: ESOPHAGOGASTRODUODENOSCOPY (EGD);  Surgeon: Corbin Ade, MD;  [facility];  Service: Endoscopy;  Laterality: N/A;   ESOPHAGOGASTRODUODENOSCOPY N/A 04/19/2017   RMR: Normal esophagus. Dilated. Erosive gastropathy. Biopsied. Otherwise normal. Pathology with mild chronic gastritis. No H. pylori   ESOPHAGOGASTRODUODENOSCOPY (EGD) WITH ESOPHAGEAL DILATION N/A 03/19/2013   ZOX:[id number] esophagus s/p dilation/Abnormal stomach of uncertain clinical significance-s/p bx positive for H. pylori, status post Prevpac treatment   ESOPHAGOGASTRODUODENOSCOPY (EGD) WITH  PROPOFOL N/A 10/02/2019   Surgeon: Corbin Ade, MD; normal esophagus dilated, normal stomach and examined duodenum.   ESOPHAGOGASTRODUODENOSCOPY (EGD) WITH PROPOFOL N/A 11/10/2021   Procedure: ESOPHAGOGASTRODUODENOSCOPY (EGD) WITH PROPOFOL;  Surgeon: Corbin Ade, MD;   [facility];  Service: Endoscopy;  Laterality: N/A;  2:45pm   KNEE ARTHROSCOPY WITH MEDIAL MENISECTOMY Right 12/06/2017   Procedure: RIGHT KNEE ARTHROSCOPY WITH PARTIAL MEDIAL MENISECTOMY; REMOVAL OF LOOSE BODY;  Surgeon: Vickki Hearing, MD;  Location: [facility];  Service: Orthopedics;  Laterality: Right;   MALONEY DILATION N/A 04/19/2017   Procedure: Elease Hashimoto DILATION;  Surgeon: Corbin Ade, MD;  [facility];  Service: Endoscopy;  Laterality: N/A;   MALONEY DILATION N/A 10/02/2019   Procedure: Elease Hashimoto DILATION;  Surgeon: Corbin Ade, MD;  [facility];  Service: Endoscopy;  Laterality: N/A;   MALONEY DILATION N/A 11/10/2021   Procedure: Elease Hashimoto DILATION;  Surgeon: Corbin Ade, MD;  [facility];  Service: Endoscopy;  Laterality: N/A;   ORIF ANKLE FRACTURE     right   ORIF TIBIA FRACTURE     Left   TONSILLECTOMY      Family History  Problem Relation Age of Onset   Heart attack Mother        deceased, age 60s   Ulcers Brother    Diabetes Brother    Hypertension Other    Colon cancer Neg Hx     Social History   Socioeconomic History   Marital status: Married    Spouse name: Not on file   Number of children: 3   Years of education: Not on file   Highest education level: Not on file  Occupational History   Occupation: disability  Tobacco Use   Smoking status: Every Day    Packs/day: 2.00    Years: 50.00    Additional pack years: 0.00    Total pack years: 100.00    Types: Cigarettes    Passive exposure: Never   Smokeless tobacco: Never   Tobacco comments:    1 pack per day as of 08/2012  Vaping Use   Vaping Use: Never used  Substance and Sexual Activity   Alcohol use: No    Alcohol/week: 0.0 standard drinks of alcohol    Comment: occasionally, usually holidays but sometimes on weekends, prior heavy use   Drug use: No   Sexual activity: Yes    Birth control/protection: None  Other Topics Concern   Not on file   Social History Narrative   Not on file   Social Determinants of Health   Financial Resource Strain: Not on file  Food Insecurity: Not on file  Transportation Needs: Not on file  Physical Activity: Not on file  Stress: Not on file  Social Connections: Not on file  Intimate Partner Violence: Not on file    Allergies  Allergen Reactions   Chantix [Varenicline]     Nightmares and personality changes.   Tomato Rash    Current Outpatient Medications  Medication Sig Dispense Refill   albuterol (PROVENTIL HFA;VENTOLIN HFA) 108 (90 BASE) MCG/ACT inhaler Inhale 2 puffs into the lungs every 6 (six) hours as needed for shortness of breath.      amLODipine (NORVASC) 2.5 MG tablet Take 2.5 mg by mouth as needed (If BP is >125-130).     aspirin EC 81 MG tablet Take 81 mg by mouth daily.     atenolol (TENORMIN) 50 MG tablet TAKE ONE TABLET BY  MOUTH ONCE DAILY IN THE MORNING (Patient taking differently: Take 50 mg by mouth as needed (BP >125-130).) 30 tablet 3   Azelastine HCl 137 MCG/SPRAY SOLN Place 2 sprays into both nostrils 2 (two) times daily. 30 mL 5   Cholecalciferol (VITAMIN D3) 1000 units CAPS Take 1 capsule by mouth daily.     dextromethorphan-guaiFENesin (MUCINEX DM) 30-600 MG 12hr tablet Take 1 tablet by mouth 2 (two) times daily.     esomeprazole (NEXIUM) 40 MG capsule TAKE 1 CAPSULE (40 MG TOTAL) BY MOUTH 2 (TWO) TIMES DAILY BEFORE A MEAL. 60 capsule 5   famotidine (PEPCID) 20 MG tablet Take 1 tablet (20 mg total) by mouth 2 (two) times daily. 60 tablet 6   fexofenadine (ALLEGRA ALLERGY) 180 MG tablet Take 1 tablet (180 mg total) by mouth daily. 90 tablet 1   fluticasone (FLONASE) 50 MCG/ACT nasal spray Place 2 sprays into both nostrils daily. 16 g 5   hydrochlorothiazide (MICROZIDE) 12.5 MG capsule TAKE 1 CAPSULE BY MOUTH ONCE A DAY FOR HTN/SWELLING  3   LORazepam (ATIVAN) 1 MG tablet TAKE 1 TABLET BY MOUTH 3 TIMES A DAY AS NEEDED 30 tablet 0   nicotine (NICODERM CQ - DOSED IN  MG/24 HOURS) 14 mg/24hr patch Place 1 patch (14 mg total) onto the skin daily. 28 patch 0   nitroGLYCERIN (NITROSTAT) 0.4 MG SL tablet Place 0.4 mg under the tongue every 5 (five) minutes as needed for chest pain. Chest Pain     Olopatadine HCl (PATADAY) 0.2 % SOLN Place 1 drop into both eyes daily as needed (itchy watery eyes). 2.5 mL 5   Plecanatide (TRULANCE) 3 MG TABS Take 3 mg by mouth daily at 6 (six) AM. 30 tablet 5   polyethylene glycol powder (GLYCOLAX/MIRALAX) powder USE 1 CAPFUL DAILY AS NEEDED FOR CONSTIPATION 527 g 11   sertraline (ZOLOFT) 50 MG tablet Take 50 mg by mouth daily.     simvastatin (ZOCOR) 40 MG tablet Take 1 tablet (40 mg total) by mouth daily. 90 tablet 1   tiZANidine (ZANAFLEX) 2 MG tablet Take 2 mg by mouth every 8 (eight) hours as needed.     traZODone (DESYREL) 50 MG tablet Take 50 mg by mouth at bedtime.  0   umeclidinium-vilanterol (ANORO ELLIPTA) 62.5-25 MCG/INH AEPB Inhale 1 puff into the lungs daily. 7 each 0   VASCEPA 1 g capsule Take 2 g by mouth 2 (two) times daily.     vitamin B-12 (CYANOCOBALAMIN) 1000 MCG tablet Take 1,000 mcg by mouth daily.     zinc gluconate 50 MG tablet Take 50 mg by mouth daily.     No current facility-administered medications for this visit.    PHYSICAL EXAM There were no vitals filed for this visit.  Chronically ill-appearing gentleman in no distress Regular rate and rhythm Unlabored breathing  palpable posterior tibial pulse bilaterally  PERTINENT LABORATORY AND RADIOLOGIC DATA  Most recent CBC    Latest Ref Rng & Units 09/25/2022   11:37 AM 03/23/2021    6:00 PM 10/10/2020    3:10 AM  CBC  WBC 4.0 - 10.5 K/uL 8.3  6.3  8.7   Hemoglobin 13.0 - 17.0 g/dL 16.1  09.6  04.5   Hematocrit 39.0 - 52.0 % 46.7  36.5  35.3   Platelets 150 - 400 K/uL 246  187  245      Most recent CMP    Latest Ref Rng & Units 09/26/2022    3:56 PM  09/25/2022   11:37 AM 11/07/2021    3:05 PM  CMP  Glucose 70 - 99 mg/dL 846  962  95    BUN 8 - 23 mg/dL 19  16  15    Creatinine 0.61 - 1.24 mg/dL 9.52  8.41  3.24   Sodium 135 - 145 mmol/L 141  141  138   Potassium 3.5 - 5.1 mmol/L 4.8  5.8  3.9   Chloride 98 - 111 mmol/L 108  106  109   CO2 22 - 32 mmol/L 25  26  21    Calcium 8.9 - 10.3 mg/dL 9.2  9.7  8.8   Total Protein 6.5 - 8.1 g/dL  7.4    Total Bilirubin 0.3 - 1.2 mg/dL  0.6    Alkaline Phos 38 - 126 U/L  80    AST 15 - 41 U/L  22    ALT 0 - 44 U/L  16      Renal function CrCl cannot be calculated (Patient's most recent lab result is older than the maximum 21 days allowed.).  No results found for: "HGBA1C"  LDL Cholesterol  Date Value Ref Range Status  09/25/2022 110 (H) 0 - 99 mg/dL Final    Comment:           Total Cholesterol/HDL:CHD Risk Coronary Heart Disease Risk Table                     Men   Women  1/2 Average Risk   3.4   3.3  Average Risk       5.0   4.4  2 X Average Risk   9.6   7.1  3 X Average Risk  23.4   11.0        Use the calculated Patient Ratio above and the CHD Risk Table to determine the patient's CHD Risk.        ATP III CLASSIFICATION (LDL):  <100     mg/dL   Optimal  401-027  mg/dL   Near or Above                    Optimal  130-159  mg/dL   Borderline  253-664  mg/dL   High  >403     mg/dL   Very High Performed at [facility], [address] [place], [place] [address]     No evidence of an abdominal aortic aneurysm was  visualized. There is evidence of abnormal dilation of the Right Common  Iliac artery and Left Common Iliac artery. Bilateral CIA aneurysms have  increased since previous CTA on 03/15/21.   Rande Brunt. Lenell Antu, MD FACS [facility] Phone Number: [phone] 04/30/2023 9:16 AM   Total time spent on preparing this encounter including chart review, data review, collecting history, examining the patient, coordinating care for this new patient, 60 minutes.  Portions of this report may have been transcribed using  voice recognition software.  Every effort has been made to ensure accuracy; however, inadvertent computerized transcription errors may still be present.

## 2023-05-01 ENCOUNTER — Ambulatory Visit (HOSPITAL_COMMUNITY)
Admission: RE | Admit: 2023-05-01 | Discharge: 2023-05-01 | Disposition: A | Discharge status: Home / Self Care | Payer: Medicare HMO | Source: Ambulatory Visit | Attending: Vascular Surgery | Admitting: Vascular Surgery

## 2023-05-01 ENCOUNTER — Encounter: Payer: Self-pay | Admitting: Vascular Surgery

## 2023-05-01 ENCOUNTER — Ambulatory Visit: Payer: Medicare HMO | Admitting: Vascular Surgery

## 2023-05-01 VITALS — BP 163/80 | HR 51 | Temp 98.1°F | Resp 20 | Ht 66.0 in | Wt 172.0 lb

## 2023-05-01 DIAGNOSIS — I723 Aneurysm of iliac artery: Secondary | ICD-10-CM | POA: Diagnosis not present

## 2023-05-01 DIAGNOSIS — I714 Abdominal aortic aneurysm, without rupture, unspecified: Secondary | ICD-10-CM | POA: Diagnosis not present

## 2023-05-14 ENCOUNTER — Other Ambulatory Visit: Payer: Self-pay

## 2023-05-14 DIAGNOSIS — I714 Abdominal aortic aneurysm, without rupture, unspecified: Secondary | ICD-10-CM

## 2023-09-20 ENCOUNTER — Ambulatory Visit: Payer: Medicare HMO | Admitting: Cardiology

## 2023-09-20 NOTE — Progress Notes (Deleted)
Clinical Summary Mr. Maisonet is a 72 y.o.male  seen today for follow up of the following medical problems.       1.CAD - long history of chest pain - cath in 2005 without mild to moderate non-obstructive disease - negative stress echo 09/2012, baseline LVEF 55-60%  - 07/2015 Lexiscan was ordered due to chest pain. It showed an inferior defect due to subdiaphragmatic attenuation, no ischemia.   -  03/2017 EGD with multiple erosions of stomach     02/2021 nuclear stress: no ischemia 03/2021 echo LVEF 65-70%, normal diastolic function, normal RV, no significant valve dysfunction - imdur causes SOB, has not helped chest pain since starting.    - mild increase in chest pain - tight feeling epigastric/upper abdomen. Can occur with activity or at rest. Some nausea. Can last few days. Not positional.  - extensive prior workup with GI - compliant with meds     2. COPD - compliant with inhalers   - chronic SOB unchaged   3. HTN - reports pcp stopped one of his bp meds.  - home bp typically 110s/60s - has not taken meds yet today - reports compared home cuff to pcp's and was accurate.    4. Hyperlipidemia Labs followed by pcp - he is on simvastatin   08/2022 TC 191 TG 157 HDL 50 LDL 110 (simva was increased to 40mg )     5. AAA screen - history of smoking, male age 76  - 08/2017 no aneurysm   6. Bilateral iliac aneurysm Past Medical History:  Diagnosis Date   Anginal pain (HCC)    Anxiety    Arthritis    both hands   Asthma    Chest pain 2005   noncritical coronary disease in 2005: 40% distal RCA, 30% CX, 50% OM 2, EF-60%,   COPD (chronic obstructive pulmonary disease) (HCC)    Chronic bronchitis; 100-pack-year cigarette consumption   Gastroesophageal reflux disease    possible small Schatzki's ring; esophageal dilatation in 2005   H. 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   Tomato Rash     Current Outpatient Medications  Medication Sig Dispense Refill   albuterol (PROVENTIL HFA;VENTOLIN HFA) 108 (90 BASE) MCG/ACT inhaler Inhale 2 puffs into the lungs every 6 (six) hours as needed for shortness of breath.      amLODipine (NORVASC) 2.5 MG tablet Take 2.5 mg by mouth as needed (If BP is >125-130).     aspirin EC 81 MG tablet Take 81 mg by mouth daily.     atenolol (TENORMIN) 50 MG tablet TAKE ONE TABLET BY MOUTH ONCE DAILY IN THE MORNING (Patient taking differently: Take 50 mg by mouth as needed (BP >125-130).) 30 tablet 3   Azelastine HCl 137 MCG/SPRAY SOLN Place 2 sprays into both nostrils 2 (two) times daily. 30 mL 5   Cholecalciferol (VITAMIN D3) 1000 units CAPS Take 1 capsule by mouth daily.     dextromethorphan-guaiFENesin (MUCINEX DM) 30-600 MG 12hr tablet Take 1 tablet by mouth 2 (two) times daily.     esomeprazole (NEXIUM) 40 MG capsule TAKE 1 CAPSULE (40 MG TOTAL) BY  MOUTH 2 (TWO) TIMES DAILY BEFORE A MEAL. 60 capsule 5   famotidine (PEPCID) 20 MG tablet Take 1 tablet (20 mg total) by mouth 2 (two) times daily. 60 tablet 6   fexofenadine (ALLEGRA ALLERGY) 180 MG tablet Take 1 tablet (180 mg total) by mouth daily. 90 tablet 1   fluticasone (FLONASE) 50 MCG/ACT nasal spray Place 2 sprays into both nostrils daily. 16 g 5   hydrochlorothiazide (MICROZIDE) 12.5 MG capsule TAKE 1 CAPSULE BY MOUTH ONCE A DAY FOR HTN/SWELLING  3   LORazepam (ATIVAN) 1 MG tablet TAKE 1 TABLET BY MOUTH 3 TIMES A DAY AS NEEDED 30 tablet 0   nicotine (NICODERM CQ - DOSED IN MG/24 HOURS) 14 mg/24hr patch Place 1 patch (14 mg total) onto the skin daily. 28 patch 0   nitroGLYCERIN (NITROSTAT) 0.4 MG SL tablet Place 0.4 mg under the tongue every 5 (five) minutes as needed for  chest pain. Chest Pain     Olopatadine HCl (PATADAY) 0.2 % SOLN Place 1 drop into both eyes daily as needed (itchy watery eyes). 2.5 mL 5   Plecanatide (TRULANCE) 3 MG TABS Take 3 mg by mouth daily at 6 (six) AM. 30 tablet 5   polyethylene glycol powder (GLYCOLAX/MIRALAX) powder USE 1 CAPFUL DAILY AS NEEDED FOR CONSTIPATION 527 g 11   sertraline (ZOLOFT) 50 MG tablet Take 50 mg by mouth daily.     simvastatin (ZOCOR) 40 MG tablet Take 1 tablet (40 mg total) by mouth daily. 90 tablet 1   tiZANidine (ZANAFLEX) 2 MG tablet Take 2 mg by mouth every 8 (eight) hours as needed.     traZODone (DESYREL) 50 MG tablet Take 50 mg by mouth at bedtime.  0   umeclidinium-vilanterol (ANORO ELLIPTA) 62.5-25 MCG/INH AEPB Inhale 1 puff into the lungs daily. 7 each 0   VASCEPA 1 g capsule Take 2 g by mouth 2 (two) times daily.     vitamin B-12 (CYANOCOBALAMIN) 1000 MCG tablet Take 1,000 mcg by mouth daily.     zinc gluconate 50 MG tablet Take 50 mg by mouth daily.     No current facility-administered medications for this visit.     Past Surgical History:  Procedure Laterality Date   CERVICAL SPINE SURGERY     C5-6 and C6-7: Relief of spinal stenosis with corpectomy, foraminotomy and fusion   COLONOSCOPY  10/19/2004   RMR:   Friable anal canal, likely source of patient's hematochezia/  Otherwise normal rectum, normal colon.   COLONOSCOPY N/A 09/09/2015   RMR: Pancolonic diverticulosis. Multiple rectal polyps and 1 polyp in the ascending colon. Pathology with hyperplastic rectal polyps and tubular adenoma colon polyp. Repeat in 2021.   COLONOSCOPY WITH PROPOFOL N/A 09/14/2020   Surgeon: Corbin Ade, MD;  diverticulosis, otherwise normal exam, recommended repeat in 7 years.   ESOPHAGEAL DILATION N/A 09/09/2015   Procedure: ESOPHAGEAL DILATION;  Surgeon: Corbin Ade, MD;  Location: [facility];  Service: Endoscopy;  Laterality: N/A;   ESOPHAGOGASTRODUODENOSCOPY  09/27/2004   RMR:   A couple of tiny,  distal esophageal erosions, otherwise normal esophagus aside from a Schatzki ring status post Maloney dilation as described above/ Small hiatal hernia, otherwise normal stomach, normal D1-D2   ESOPHAGOGASTRODUODENOSCOPY N/A 09/09/2015   Procedure: ESOPHAGOGASTRODUODENOSCOPY (EGD);  Surgeon: Corbin Ade, MD;  Location: [facility];  Service: Endoscopy;  Laterality: N/A;   ESOPHAGOGASTRODUODENOSCOPY N/A 04/19/2017   RMR: Normal esophagus. Dilated. Erosive gastropathy. Biopsied. Otherwise normal. Pathology with mild chronic  gastritis. No H. pylori   ESOPHAGOGASTRODUODENOSCOPY (EGD) WITH ESOPHAGEAL DILATION N/A 03/19/2013   ZOX:[id number] esophagus s/p dilation/Abnormal stomach of uncertain clinical significance-s/p bx positive for H. pylori, status post Prevpac treatment   ESOPHAGOGASTRODUODENOSCOPY (EGD) WITH PROPOFOL N/A 10/02/2019   Surgeon: Corbin Ade, MD; normal esophagus dilated, normal stomach and examined duodenum.   ESOPHAGOGASTRODUODENOSCOPY (EGD) WITH PROPOFOL N/A 11/10/2021   Procedure: ESOPHAGOGASTRODUODENOSCOPY (EGD) WITH PROPOFOL;  Surgeon: Corbin Ade, MD;  Location: [facility];  Service: Endoscopy;  Laterality: N/A;  2:45pm   KNEE ARTHROSCOPY WITH MEDIAL MENISECTOMY Right 12/06/2017   Procedure: RIGHT KNEE ARTHROSCOPY WITH PARTIAL MEDIAL MENISECTOMY; REMOVAL OF LOOSE BODY;  Surgeon: Vickki Hearing, MD;  Location: [facility];  Service: Orthopedics;  Laterality: Right;   MALONEY DILATION N/A 04/19/2017   Procedure: Elease Hashimoto DILATION;  Surgeon: Corbin Ade, MD;  Location: [facility];  Service: Endoscopy;  Laterality: N/A;   MALONEY DILATION N/A 10/02/2019   Procedure: Elease Hashimoto DILATION;  Surgeon: Corbin Ade, MD;  Location: [facility];  Service: Endoscopy;  Laterality: N/A;   MALONEY DILATION N/A 11/10/2021   Procedure: Elease Hashimoto DILATION;  Surgeon: Corbin Ade, MD;  Location: [facility];  Service: Endoscopy;  Laterality: N/A;   ORIF ANKLE FRACTURE      right   ORIF TIBIA FRACTURE     Left   TONSILLECTOMY       Allergies  Allergen Reactions   Chantix [Varenicline]     Nightmares and personality changes.   Tomato Rash      Family History  Problem Relation Age of Onset   Heart attack Mother        deceased, age 69s   Ulcers Brother    Diabetes Brother    Hypertension Other    Colon cancer Neg Hx      Social History Mr. Coello reports that he has been smoking cigarettes. He has a 75 pack-year smoking history. He has never been exposed to tobacco smoke. He has never used smokeless tobacco. Mr. Marana reports no history of alcohol use.   Review of Systems CONSTITUTIONAL: No weight loss, fever, chills, weakness or fatigue.  HEENT: Eyes: No visual loss, blurred vision, double vision or yellow sclerae.No hearing loss, sneezing, congestion, runny nose or sore throat.  SKIN: No rash or itching.  CARDIOVASCULAR:  RESPIRATORY: No shortness of breath, cough or sputum.  GASTROINTESTINAL: No anorexia, nausea, vomiting or diarrhea. No abdominal pain or blood.  GENITOURINARY: No burning on urination, no polyuria NEUROLOGICAL: No headache, dizziness, syncope, paralysis, ataxia, numbness or tingling in the extremities. No change in bowel or bladder control.  MUSCULOSKELETAL: No muscle, back pain, joint pain or stiffness.  LYMPHATICS: No enlarged nodes. No history of splenectomy.  PSYCHIATRIC: No history of depression or anxiety.  ENDOCRINOLOGIC: No reports of sweating, cold or heat intolerance. No polyuria or polydipsia.  Marland Kitchen   Physical Examination There were no vitals filed for this visit. There were no vitals filed for this visit.  Gen: resting comfortably, no acute distress HEENT: no scleral icterus, pupils equal round and reactive, no palptable cervical adenopathy,  CV Resp: Clear to auscultation bilaterally GI: abdomen is soft, non-tender, non-distended, normal bowel sounds, no hepatosplenomegaly MSK: extremities are warm, no  edema.  Skin: warm, no rash Neuro:  no focal deficits Psych: appropriate affect   Diagnostic Studies  07/2004 Cath The left main is a large vessel with no significant disease.    The LAD is a medium size vessel  which courses three quarters of the way down  the anterior wall and has no significant disease.  There are two small  diagonal branches with no significant disease.    The left circumflex is a large vessel coursing through the AV groove.  The  third obtuse marginal Jlyn Bracamonte is free of the AV groove circumflex and noted  to have a mild 30% narrowing after the takeoff the second OM.    The first OM is a small vessel which bifurcates in the proximal portion with  no significant disease.    The second OM is a large vessel which bifurcates distally and has 50% mid  vessel stenosis.    The third OM is a small vessel with no significant disease.    The right coronary artery is a large vessel which is dominant and gives rise  to both the PDA as well as the posterolateral Kessa Fairbairn.  The RCA is ectatic  but has no high grade stenosis.    The PDA is a medium size vessel with no significant disease.    The PLA is a large vessel which bifurcates in the mid segment. There is 40%  lesion in the proximal portion fo the lower bifurcation.    Left ventriculogram reveals a preserved ejection fraction of 60%.    Hemodynamics:  The systemic arterial pressure was 112/69.  Left ventricular  pressure was 115/8.  Left ventricular end diastolic pressure was 13.    CONCLUSION:  1.  Non-critical coronary artery disease.  2.  Normal left ventricular systolic function.     09/2012 Stress echo Study Conclusions  - Stress: There was resting hypertension with a hypertensive   response to stress. Atypical chest pain was present before   and during exercise without increase in intensity, and it   persisted following conclusion of the test. - Stress ECG conclusions: No diagnostic ST abnormalities  or   arrhythmias. The stress ECG was negative for ischemia.   [facility] scoring: exercise time of ; maximum ST deviation   of 0mm; angina present but did not limit exercise;   resulting score is -1. This score predicts a moderate risk   of cardiac events. - Staged echo: There was no echocardiographic evidence for   stress-induced ischemia. - Peak stress: LV global systolic function was vigorous. The   estimated LV ejection fraction was 70% to 75%. No evidence   for new LV regional wall motion abnormalities.     07/2015 Nuclear stress test There was no ST segment deviation noted during stress. Defect 1: There is a medium defect of mild severity present in the basal inferior, mid inferior and apical inferior location, which is likely due to soft tissue attenuation artifact. A mild degree of myocardial scar cannot entirely be ruled out. This is a low risk study. Nuclear stress EF: 62%.     02/2021 nuclear stress No diagnostic ST segment changes to indicate ischemia. Small, moderate intensity, apical to basal inferior defect that is fixed and suggestive of scar although normal wall motion suggests possibility of diaphragmatic attenuation as well. No definitive ischemic territories are noted. This is a low risk study. Nuclear stress EF: 71%.       03/2021 echo IMPRESSIONS     1. Left ventricular ejection fraction, by estimation, is 65 to 70%. The  left ventricle has normal function. The left ventricle has no regional  wall motion abnormalities. Left ventricular diastolic parameters were  normal. The average left ventricular  global longitudinal strain  is -20.1 %. The global longitudinal strain is  normal.   2. Right ventricular systolic function is normal. The right ventricular  size is normal.   3. The mitral valve is normal in structure. Trivial mitral valve  regurgitation. No evidence of mitral stenosis.   4. The aortic valve has an indeterminant number of cusps. There is   moderate calcification of the aortic valve. There is moderate thickening  of the aortic valve. Aortic valve regurgitation is not visualized. No  aortic stenosis is present.   5. The inferior vena cava is normal in size with greater than 50%  respiratory variability, suggesting right atrial pressure of 3 mmHg.   09/2022 echo 1. Left ventricular ejection fraction, by estimation, is 60 to 65%. The  left ventricle has normal function. The left ventricle has no regional  wall motion abnormalities. Left ventricular diastolic parameters are  consistent with Grade I diastolic  dysfunction (impaired relaxation). The average left ventricular global  longitudinal strain is -23.0 %. The global longitudinal strain is normal.   2. Right ventricular systolic function is normal. The right ventricular  size is normal. Tricuspid regurgitation signal is inadequate for assessing  PA pressure.   3. The mitral valve is normal in structure. No evidence of mitral valve  regurgitation. No evidence of mitral stenosis.   4. The aortic valve has an indeterminant number of cusps. There is mild  calcification of the aortic valve. There is mild thickening of the aortic  valve. Aortic valve regurgitation is not visualized. No aortic stenosis is  present.   5. The inferior vena cava is normal in size with greater than 50%  respiratory variability, suggesting right atrial pressure of 3 mmHg.    Assessment and Plan   1.Chest pain/SOB - long history of atypical chest pain and chronic SOB, symptoms overall unchanged over several years - negative ischemic evaluations in the past including cath in 2005, multiple benign lexiscans including most recently 02/2021 - more recently upper abdominal pains, epigastric pain. Extensive GI workup was benign - no cardiac chest pains by description - no further cardiac testing indicated at this time - continue to monitor   2. HTN - elevated here but home numbers consistently at  goal, continue current meds   3. HLD - request labs from pcp   4.Iliac artery aneurysms - incidental finding on 2022 CT A/P, refer to vacular to help monitor     Antoine Poche, M.D., F.A.C.C.
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
## 2023-10-03 ENCOUNTER — Ambulatory Visit: Payer: Medicare HMO | Admitting: Gastroenterology

## 2023-10-03 ENCOUNTER — Encounter: Payer: Self-pay | Admitting: Gastroenterology

## 2023-10-03 ENCOUNTER — Other Ambulatory Visit: Payer: Self-pay | Admitting: Gastroenterology

## 2023-10-03 VITALS — BP 139/84 | HR 92 | Temp 98.1°F | Ht 66.0 in | Wt 180.0 lb

## 2023-10-03 DIAGNOSIS — K219 Gastro-esophageal reflux disease without esophagitis: Secondary | ICD-10-CM

## 2023-10-03 DIAGNOSIS — G8929 Other chronic pain: Secondary | ICD-10-CM

## 2023-10-03 DIAGNOSIS — R1010 Upper abdominal pain, unspecified: Secondary | ICD-10-CM | POA: Diagnosis not present

## 2023-10-03 DIAGNOSIS — K5909 Other constipation: Secondary | ICD-10-CM | POA: Diagnosis not present

## 2023-10-03 MED ORDER — MOTEGRITY 2 MG PO TABS
2.0000 mg | ORAL_TABLET | Freq: Every day | ORAL | 5 refills | Status: DC
Start: 2023-10-03 — End: 2024-01-02

## 2023-10-03 NOTE — Patient Instructions (Signed)
I would like to try you on Motegrity 2 mg daily for constipation.  I am sending a prescription to your pharmacy.  You can take MiraLAX 1 capful (17 g) 1-2 times daily in 8 ounces of water if Motegrity alone is not controlling her constipation.  Continue taking Nexium and famotidine for acid reflux as prescribed by her primary care provider.  I will plan to see you back in the office in 3 months or sooner if needed.  It was good to see you today!  I hope you have a fantastic Christmas!  Ermalinda Memos, PA-C [facility]

## 2023-10-10 NOTE — Telephone Encounter (Signed)
Pt last seen by [facility] 10/03/2023. This needs a PA

## 2023-10-26 ENCOUNTER — Encounter: Payer: Self-pay | Admitting: Nurse Practitioner

## 2023-10-26 ENCOUNTER — Ambulatory Visit: Payer: Medicare HMO | Attending: Nurse Practitioner | Admitting: Nurse Practitioner

## 2023-10-26 VITALS — BP 122/74 | HR 64 | Ht 66.0 in | Wt 184.2 lb

## 2023-10-26 DIAGNOSIS — J449 Chronic obstructive pulmonary disease, unspecified: Secondary | ICD-10-CM | POA: Diagnosis not present

## 2023-10-26 DIAGNOSIS — I723 Aneurysm of iliac artery: Secondary | ICD-10-CM | POA: Diagnosis not present

## 2023-10-26 DIAGNOSIS — I10 Essential (primary) hypertension: Secondary | ICD-10-CM

## 2023-10-26 DIAGNOSIS — Z720 Tobacco use: Secondary | ICD-10-CM | POA: Diagnosis not present

## 2023-10-26 DIAGNOSIS — R0789 Other chest pain: Secondary | ICD-10-CM | POA: Diagnosis not present

## 2023-10-26 DIAGNOSIS — E785 Hyperlipidemia, unspecified: Secondary | ICD-10-CM

## 2023-10-26 NOTE — Patient Instructions (Signed)

## 2023-10-26 NOTE — Progress Notes (Unsigned)
Cardiology Office Note:    Date:  10/26/2023  ID:  Trevor Berg, DOB 04/11/1951, MRN [id number] PCP:  Ollen Bowl, MD [facility] Providers Cardiologist:  Dina Rich, MD     Referring MD: Ollen Bowl, MD   CC: Here for 6 month follow-up  History of Present Illness:    Trevor Berg is a 72 y.o. male with a PMH of chronic, atypical chest pain, hypertension, hyperlipidemia, bilateral iliac artery aneurysms (follows Dr. Lenell Antu with [facility]), tobacco abuse, COPD, GERD, and IBS, who presents today for 40-month follow-up appointment.  Last seen by Dr. Dina Rich on Mar 22, 2023.  He denied any cardiac chest pain at that time, although it was noted he had a long history of atypical chest pain and chronic shortness of breath with multiple benign Lexiscans in the past.  Blood pressure was elevated in the office, however home numbers were at goal.  Today he presents for follow-up. He states...   Past Medical History:  Diagnosis Date   Anginal pain (HCC)    Anxiety    Arthritis    both hands   Asthma    Chest pain 2005   noncritical coronary disease in 2005: 40% distal RCA, 30% CX, 50% OM 2, EF-60%,   COPD (chronic obstructive pulmonary disease) (HCC)    Chronic bronchitis; 100-pack-year cigarette consumption   Gastroesophageal reflux disease    possible small Schatzki's ring; esophageal dilatation in 2005   H. 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   COLONOSCOPY N/A  09/09/2015   RMR: Pancolonic diverticulosis. Multiple rectal polyps and 1 polyp in the ascending colon. Pathology with hyperplastic rectal polyps and tubular adenoma colon polyp. Repeat in 2021.   COLONOSCOPY WITH PROPOFOL N/A 09/14/2020   Surgeon: Corbin Ade, MD;  diverticulosis, otherwise normal exam, recommended repeat in 7 years.   ESOPHAGEAL DILATION N/A 09/09/2015   Procedure: ESOPHAGEAL DILATION;  Surgeon: Corbin Ade, MD;  [facility];  Service: Endoscopy;  Laterality: N/A;   ESOPHAGOGASTRODUODENOSCOPY  09/27/2004   RMR:   A couple of tiny, distal esophageal erosions, otherwise normal esophagus aside from a Schatzki ring status post Maloney dilation as described above/ Small hiatal hernia, otherwise normal stomach, normal D1-D2   ESOPHAGOGASTRODUODENOSCOPY N/A 09/09/2015   Procedure: ESOPHAGOGASTRODUODENOSCOPY (EGD);  Surgeon: Corbin Ade, MD;  [facility];  Service: Endoscopy;  Laterality: N/A;   ESOPHAGOGASTRODUODENOSCOPY N/A 04/19/2017   RMR: Normal esophagus. Dilated. Erosive gastropathy. Biopsied. Otherwise normal. Pathology with mild chronic gastritis. No H. pylori   ESOPHAGOGASTRODUODENOSCOPY (EGD) WITH ESOPHAGEAL DILATION N/A 03/19/2013   WUJ:WJXBJY esophagus s/p dilation/Abnormal stomach of uncertain clinical significance-s/p bx positive for H. pylori, status post Prevpac treatment   ESOPHAGOGASTRODUODENOSCOPY (EGD) WITH PROPOFOL N/A 10/02/2019   Surgeon: Corbin Ade, MD; normal esophagus dilated, normal stomach and examined duodenum.   ESOPHAGOGASTRODUODENOSCOPY (EGD) WITH PROPOFOL N/A 11/10/2021   Procedure: ESOPHAGOGASTRODUODENOSCOPY (EGD) WITH PROPOFOL;  Surgeon: Corbin Ade, MD;  [facility];  Service: Endoscopy;  Laterality: N/A;  2:45pm   KNEE ARTHROSCOPY WITH MEDIAL MENISECTOMY Right 12/06/2017   Procedure: RIGHT KNEE ARTHROSCOPY WITH PARTIAL MEDIAL MENISECTOMY; REMOVAL OF LOOSE BODY;  Surgeon: Vickki Hearing, MD;   Location: [facility];  Service: Orthopedics;  Laterality: Right;   MALONEY DILATION N/A 04/19/2017   Procedure: Elease Hashimoto DILATION;  Surgeon: Corbin Ade, MD;  [facility];  Service: Endoscopy;  Laterality: N/A;   MALONEY DILATION N/A 10/02/2019   Procedure: Elease Hashimoto DILATION;  Surgeon: Corbin Ade, MD;  [facility];  Service: Endoscopy;  Laterality: N/A;   MALONEY DILATION N/A 11/10/2021   Procedure: Elease Hashimoto DILATION;  Surgeon: Corbin Ade, MD;  [facility];  Service: Endoscopy;  Laterality: N/A;   ORIF ANKLE FRACTURE     right   ORIF TIBIA FRACTURE     Left   TONSILLECTOMY      Current Medications: Current Meds  Medication Sig   albuterol (PROVENTIL HFA;VENTOLIN HFA) 108 (90 BASE) MCG/ACT inhaler Inhale 2 puffs into the lungs every 6 (six) hours as needed for shortness of breath.    amLODipine (NORVASC) 2.5 MG tablet Take 2.5 mg by mouth as needed (If BP is >125-130).   aspirin EC 81 MG tablet Take 81 mg by mouth daily.   atenolol (TENORMIN) 50 MG tablet TAKE ONE TABLET BY MOUTH ONCE DAILY IN THE MORNING (Patient taking differently: Take 50 mg by mouth as needed (BP >125-130).)   Azelastine HCl 137 MCG/SPRAY SOLN Place 2 sprays into both nostrils 2 (two) times daily.   Cholecalciferol (VITAMIN D3) 1000 units CAPS Take 1 capsule by mouth daily.   dextromethorphan-guaiFENesin (MUCINEX DM) 30-600 MG 12hr tablet Take 1 tablet by mouth 2 (two) times daily.   esomeprazole (NEXIUM) 40 MG capsule TAKE 1 CAPSULE (40 MG TOTAL) BY MOUTH 2 (TWO) TIMES DAILY BEFORE A MEAL.   fexofenadine (ALLEGRA ALLERGY) 180 MG tablet Take 1 tablet (180 mg total) by mouth daily.   fluticasone (FLONASE) 50 MCG/ACT nasal spray Place 2 sprays into both nostrils daily.   hydrochlorothiazide (MICROZIDE) 12.5 MG capsule TAKE 1 CAPSULE BY MOUTH ONCE A DAY FOR HTN/SWELLING   LORazepam (ATIVAN) 1 MG tablet TAKE 1 TABLET BY MOUTH 3 TIMES A DAY AS NEEDED   nitroGLYCERIN (NITROSTAT) 0.4  MG SL tablet Place 0.4 mg under the tongue every 5 (five) minutes as needed for chest pain. Chest Pain   Olopatadine HCl (PATADAY) 0.2 % SOLN Place 1 drop into both eyes daily as needed (itchy watery eyes).   Plecanatide (TRULANCE) 3 MG TABS Take 3 mg by mouth daily at 6 (six) AM.   polyethylene glycol powder (GLYCOLAX/MIRALAX) powder USE 1 CAPFUL DAILY AS NEEDED FOR CONSTIPATION   sertraline (ZOLOFT) 50 MG tablet Take 50 mg by mouth daily.   simvastatin (ZOCOR) 20 MG tablet Take 20 mg by mouth daily.   tiZANidine (ZANAFLEX) 2 MG tablet Take 2 mg by mouth every 8 (eight) hours as needed.   traZODone (DESYREL) 50 MG tablet Take 50 mg by mouth at bedtime.   umeclidinium-vilanterol (ANORO ELLIPTA) 62.5-25 MCG/INH AEPB Inhale 1 puff into the lungs daily.   vitamin B-12 (CYANOCOBALAMIN) 1000 MCG tablet Take 1,000 mcg by mouth daily.   zinc gluconate 50 MG tablet Take 50 mg by mouth daily.     Allergies:   Chantix [varenicline] and Tomato   Social History   Socioeconomic History  Marital status: Married    Spouse name: Not on file   Number of children: 3   Years of education: Not on file   Highest education level: Not on file  Occupational History   Occupation: disability  Tobacco Use   Smoking status: Every Day    Current packs/day: 1.50    Average packs/day: 1.5 packs/day for 50.0 years (75.0 ttl pk-yrs)    Types: Cigarettes    Passive exposure: Never   Smokeless tobacco: Never   Tobacco comments:    1 pack per day as of 08/2012  Vaping Use   Vaping status: Never Used  Substance and Sexual Activity   Alcohol use: No    Alcohol/week: 0.0 standard drinks of alcohol    Comment: occasionally, usually holidays but sometimes on weekends, prior heavy use   Drug use: No   Sexual activity: Yes    Birth control/protection: None  Other Topics Concern   Not on file  Social History Narrative   Not on file   Social Drivers of Health   Financial Resource Strain: Not on file  Food  Insecurity: Not on file  Transportation Needs: Not on file  Physical Activity: Not on file  Stress: Not on file  Social Connections: Not on file     Family History: The patient's family history includes Diabetes in his brother; Heart attack in his mother; Hypertension in an other family member; Ulcers in his brother. There is no history of Colon cancer.  ROS:   Review of Systems  Constitutional: Negative.   HENT: Negative.    Eyes: Negative.   Respiratory: Negative.    Cardiovascular:  Positive for chest pain and leg swelling. Negative for palpitations, orthopnea, claudication and PND.       See HPI.  Gastrointestinal:  Positive for abdominal pain and heartburn. Negative for blood in stool, constipation, diarrhea, melena, nausea and vomiting.       See HPI.  Genitourinary: Negative.   Musculoskeletal: Negative.   Skin: Negative.   Neurological: Negative.   Endo/Heme/Allergies: Negative.   Psychiatric/Behavioral: Negative.      Please see the history of present illness.    All other systems reviewed and are negative.  EKGs/Labs/Other Studies Reviewed:    The following studies were reviewed today:   EKG:  EKG is ordered today.  The ekg ordered today demonstrates NSR, 87 bpm, LAFB, otherwise no acute ischemic changes.    ABIs on 04/14/2021: Right: Resting right ankle-brachial index indicates noncompressible right  lower extremity arteries. The right toe-brachial index is normal.   Left: Resting left ankle-brachial index indicates noncompressible left  lower extremity arteries. The left toe-brachial index is normal.  2D Complete Echo on 04/14/2021:  1. Left ventricular ejection fraction, by estimation, is 65 to 70%. The  left ventricle has normal function. The left ventricle has no regional  wall motion abnormalities. Left ventricular diastolic parameters were  normal. The average left ventricular  global longitudinal strain is -20.1 %. The global longitudinal strain is   normal.   2. Right ventricular systolic function is normal. The right ventricular  size is normal.   3. The mitral valve is normal in structure. Trivial mitral valve  regurgitation. No evidence of mitral stenosis.   4. The aortic valve has an indeterminant number of cusps. There is  moderate calcification of the aortic valve. There is moderate thickening  of the aortic valve. Aortic valve regurgitation is not visualized. No  aortic stenosis is present.   5.  The inferior vena cava is normal in size with greater than 50%  respiratory variability, suggesting right atrial pressure of 3 mmHg.   Comparison(s): Stress echocr=ardiogram done 10/17/12 showed an EF of 70%.   Lexiscan on 03/08/2021: No diagnostic ST segment changes to indicate ischemia. Small, moderate intensity, apical to basal inferior defect that is fixed and suggestive of scar although normal wall motion suggests possibility of diaphragmatic attenuation as well. No definitive ischemic territories are noted. This is a low risk study. Nuclear stress EF: 71%.   Recent Labs: No results found for requested labs within last 365 days.  Recent Lipid Panel    Component Value Date/Time   CHOL 191 09/25/2022 1137   TRIG 157 (H) 09/25/2022 1137   HDL 50 09/25/2022 1137   CHOLHDL 3.8 09/25/2022 1137   VLDL 31 09/25/2022 1137   LDLCALC 110 (H) 09/25/2022 1137     Physical Exam:    VS:  There were no vitals taken for this visit.    Wt Readings from Last 3 Encounters:  10/03/23 180 lb (81.6 kg)  05/01/23 172 lb (78 kg)  03/22/23 182 lb (82.6 kg)     GEN: Well nourished, well developed in no acute distress HEENT: Normal NECK: No JVD; No carotid bruits CARDIAC: S1/S2, RRR, no murmurs, rubs, gallops; 2+ peripheral pulses throughout, strong and equal bilaterally RESPIRATORY:  Clear and diminished to auscultation without rales, wheezing or rhonchi  ABDOMEN: Soft, generalized tenderness to palpation, distended MUSCULOSKELETAL:   No edema; No deformity  SKIN: Thin skin, warm and dry NEUROLOGIC:  Alert and oriented x 3 PSYCHIATRIC:  Normal affect   ASSESSMENT:    No diagnosis found.  PLAN:    In order of problems listed above:  Atypical chest pain Very atypical in presentation, similar to last OV in 09/2021 with Dr. Dina Rich. Has long standing hx of similar atypical CP and chronic SOB, unchanged. Has not taken NG. Last Lexiscan performed in 2022 was considered low risk without definitive ischemic territories noted. No acute ischemic changes seen on 12 lead ECG today; however, LAFB noted. Continue current medication regimen. Heart healthy diet and regular cardiovascular exercise as tolerated encouraged. Will obtain CBC, CMET, and FLP. ED precautions discussed.   Left anterior fascicular block on EKG Noted on EKG today; however, appears he had this on 12 lead ECG 1 year ago. Will obtain TTE to evaluate for any WMA. If WMA present and chest pain has not resolved, will bring pt back in for follow-up and consider repeating Lexiscan or consider further ischemic workup. Continue current medication regimen. Will obtain the following lab work as mentioned above.   COPD, shortness of breath Stable, chronic shortness of breath. No recent worsening shortness of breath or exacerbations noted. Continue to follow up with Pulmonology. Continue current medication regimen. Continue to follow with PCP.  HTN Blood pressure today 120/80.  Wife and patient state BP is well-controlled at home.  Continue amlodipine as needed, atenolol, and hydrochlorothiazide.  Will obtain lab work as mentioned above. Heart healthy diet and regular cardiovascular exercise as tolerated encouraged.   HLD Labs from December 2022 revealed total cholesterol 135, HDL 40, LDL 74, and triglycerides 409.  Continue simvastatin.  We will obtain FLP and CMET as mentioned above as he is due for these labs to be rechecked. Heart healthy diet and regular  cardiovascular exercise encouraged.   Bilateral iliac artery aneurysms CT of abdomen in May 2022 revealed 2.5 cm right and 2.3 cm left common  iliac artery fusiform aneurysm, was previously referred to Dr. Tawanna Cooler Early with [facility], it has been over 2 years since he was last seen in office.  Will refer again to Dr. Tawanna Cooler Early with [facility]. Continue to follow with PCP.  Tobacco abuse Currently smoking 1/2 pack/day.  Wife states he is cut down his smoking, used to smoke 3 packs/day.  Smoking cessation encouraged and discussed.  Will initiate nicotine patch 14 mg daily x 30 days, and patient will start when he is ready to quit smoking.  He will call in and request for refill when needed.  GERD Does admit to recent dietary indiscretions with frequent reports of heartburn recently.  Continue Nexium.  Will initiate famotidine 20 mg twice daily x 6 weeks.  Continue to follow-up with GI and PCP.     9. Lower extremity edema Wife notes he has lower extremity swelling after being on his feet all day.  I discussed and encouraged wearing compression stockings.  Will write prescription for knee high compression stockings bilaterally, 15 to 20 mmHg of compression recommended and ordered.   10. Disposition: Follow-up with me or Dr. Dina Rich in 6 months or sooner if anything changes.     Labs with PCP.       Medication Adjustments/Labs and Tests Ordered: Current medicines are reviewed at length with the patient today.  Concerns regarding medicines are outlined above.  No orders of the defined types were placed in this encounter.  No orders of the defined types were placed in this encounter.   There are no Patient Instructions on file for this visit.   Signed, Sharlene Dory, NP  10/26/2023 2:38 PM    [facility]
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
## 2024-01-02 ENCOUNTER — Ambulatory Visit: Payer: Medicare HMO | Admitting: Gastroenterology

## 2024-01-02 ENCOUNTER — Other Ambulatory Visit (HOSPITAL_COMMUNITY)
Admission: RE | Admit: 2024-01-02 | Discharge: 2024-01-02 | Disposition: A | Discharge status: Home / Self Care | Source: Ambulatory Visit | Attending: Gastroenterology | Admitting: Gastroenterology

## 2024-01-02 ENCOUNTER — Encounter: Payer: Self-pay | Admitting: Gastroenterology

## 2024-01-02 ENCOUNTER — Other Ambulatory Visit: Payer: Self-pay | Admitting: Gastroenterology

## 2024-01-02 VITALS — BP 144/88 | HR 70 | Temp 97.6°F | Ht 66.0 in | Wt 187.0 lb

## 2024-01-02 DIAGNOSIS — R1310 Dysphagia, unspecified: Secondary | ICD-10-CM | POA: Diagnosis not present

## 2024-01-02 DIAGNOSIS — K5909 Other constipation: Secondary | ICD-10-CM | POA: Insufficient documentation

## 2024-01-02 DIAGNOSIS — K219 Gastro-esophageal reflux disease without esophagitis: Secondary | ICD-10-CM

## 2024-01-02 LAB — BASIC METABOLIC PANEL
Anion gap: 8 (ref 5–15)
BUN: 17 mg/dL (ref 8–23)
CO2: 28 mmol/L (ref 22–32)
Calcium: 9.4 mg/dL (ref 8.9–10.3)
Chloride: 103 mmol/L (ref 98–111)
Creatinine, Ser: 1.05 mg/dL (ref 0.61–1.24)
GFR, Estimated: >60 mL/min (ref >60)
Glucose, Bld: 102 mg/dL — ABNORMAL HIGH (ref 70–99)
Potassium: 4.4 mmol/L (ref 3.5–5.1)
Sodium: 139 mmol/L (ref 135–145)

## 2024-01-02 MED ORDER — ESOMEPRAZOLE MAGNESIUM 40 MG PO CPDR
40.0000 mg | DELAYED_RELEASE_CAPSULE | Freq: Two times a day (BID) | ORAL | 5 refills | Status: DC
Start: 2024-01-02 — End: 2024-07-21

## 2024-01-02 MED ORDER — PRUCALOPRIDE SUCCINATE 2 MG PO TABS
2.0000 mg | ORAL_TABLET | Freq: Every day | ORAL | 5 refills | Status: DC
Start: 2024-01-02 — End: 2024-01-31

## 2024-01-02 NOTE — Patient Instructions (Addendum)
 I would like to try to see if we can get Motegrity covered for you.  Prior to sending this medication in, we are going to need to update your kidney function. Please have BMP completed at [facility].   For now, increase MiraLAX to 17 g in 8 ounces of water twice daily.  If this is not enough, you can take MiraLAX 3 times a day.  Stop omeprazole and start Nexium 40 mg twice daily 30 minutes before breakfast and dinner.  You can also take over-the-counter Pepcid as needed up to twice a day for breakthrough heartburn.  Follow a GERD diet:  Avoid fried, fatty, greasy, spicy, citrus foods. Avoid caffeine and carbonated beverages. Avoid chocolate. Try eating 4-6 small meals a day rather than 3 large meals. Do not eat within 3 hours of laying down. Prop head of bed up on wood or bricks to create a 6 inch incline.  Dysphagia precautions:  Eat slowly, take small bites, chew thoroughly, drink plenty of liquids throughout meals.  Avoid trough textures All meats should be chopped finely.  If something gets hung in your esophagus and will not come up or go down, proceed to the emergency room.      I will plan to see you back in the office in 3 months or sooner if needed.   Ermalinda Memos, PA-C [facility]

## 2024-01-28 ENCOUNTER — Telehealth: Payer: Self-pay | Admitting: *Deleted

## 2024-01-28 NOTE — Telephone Encounter (Signed)
 Received a refill request for motegrity. Pt last Ov 01/02/2024.

## 2024-01-31 ENCOUNTER — Telehealth: Payer: Self-pay | Admitting: *Deleted

## 2024-01-31 ENCOUNTER — Other Ambulatory Visit: Payer: Self-pay | Admitting: Gastroenterology

## 2024-01-31 DIAGNOSIS — K5909 Other constipation: Secondary | ICD-10-CM

## 2024-01-31 MED ORDER — PRUCALOPRIDE SUCCINATE 2 MG PO TABS
2.0000 mg | ORAL_TABLET | Freq: Every day | ORAL | 5 refills | Status: AC
Start: 2024-01-31 — End: ?

## 2024-01-31 NOTE — Telephone Encounter (Signed)
 Received a refill request for motegrity. Pt last OV 01/28/2024

## 2024-01-31 NOTE — Telephone Encounter (Signed)
 Rx sent

## 2024-02-04 DIAGNOSIS — J3089 Other allergic rhinitis: Secondary | ICD-10-CM | POA: Diagnosis not present

## 2024-02-04 DIAGNOSIS — F17200 Nicotine dependence, unspecified, uncomplicated: Secondary | ICD-10-CM | POA: Diagnosis not present

## 2024-02-04 DIAGNOSIS — J449 Chronic obstructive pulmonary disease, unspecified: Secondary | ICD-10-CM | POA: Diagnosis not present

## 2024-02-04 DIAGNOSIS — H1045 Other chronic allergic conjunctivitis: Secondary | ICD-10-CM | POA: Diagnosis not present

## 2024-02-19 ENCOUNTER — Encounter: Payer: Self-pay | Admitting: Emergency Medicine

## 2024-03-05 ENCOUNTER — Encounter: Payer: Self-pay | Admitting: Gastroenterology

## 2024-04-21 ENCOUNTER — Ambulatory Visit: Payer: Medicare HMO | Attending: Nurse Practitioner | Admitting: Nurse Practitioner

## 2024-04-21 ENCOUNTER — Encounter: Payer: Self-pay | Admitting: Nurse Practitioner

## 2024-04-21 VITALS — BP 122/84 | HR 62 | Ht 66.0 in | Wt 187.0 lb

## 2024-04-21 DIAGNOSIS — I723 Aneurysm of iliac artery: Secondary | ICD-10-CM

## 2024-04-21 DIAGNOSIS — Z720 Tobacco use: Secondary | ICD-10-CM

## 2024-04-21 DIAGNOSIS — J449 Chronic obstructive pulmonary disease, unspecified: Secondary | ICD-10-CM | POA: Diagnosis not present

## 2024-04-21 DIAGNOSIS — E785 Hyperlipidemia, unspecified: Secondary | ICD-10-CM | POA: Diagnosis not present

## 2024-04-21 DIAGNOSIS — R0789 Other chest pain: Secondary | ICD-10-CM

## 2024-04-21 DIAGNOSIS — I10 Essential (primary) hypertension: Secondary | ICD-10-CM

## 2024-04-21 NOTE — Progress Notes (Signed)
 Cardiology Office Note:    Date:  04/21/2024 ID:  Trevor Berg, DOB 04-28-1951, MRN [id number] PCP:  Vernon Velna SAUNDERS, MD [facility] Providers Cardiologist:  Alvan Carrier, MD     Referring MD: Vernon Velna SAUNDERS, MD   CC: Here for 6 month follow-up  History of Present Illness:    Trevor Berg is a 73 y.o. male with a PMH of chronic, atypical chest pain, hypertension, hyperlipidemia, bilateral iliac artery aneurysms (follows Dr. Magda with [facility]), tobacco abuse, COPD, GERD, and IBS, who presents today for 71-month follow-up appointment.  Last seen by Dr. Carrier Alvan on Mar 22, 2023.  He denied any cardiac chest pain at that time, although it was noted he had a long history of atypical chest pain and chronic shortness of breath with multiple benign Lexiscans in the past.  Blood pressure was elevated in the office, however home numbers were at goal.  10/26/2023 - Today he presents for follow-up. He states he is doing well. Denies any acute cardiac complaints or issues. Denies any chest pain, shortness of breath, palpitations, syncope, presyncope, dizziness, orthopnea, PND, swelling or significant weight changes, acute bleeding, or claudication. Continues to smoke.    04/21/2024 - Here for follow-up. Denies any complaints. Denies any chest pain, shortness of breath, palpitations, syncope, presyncope, dizziness, orthopnea, PND, swelling or significant weight changes, acute bleeding, or claudication.  SH: In his free time, he collects pocket watches and knives. Own over 200 knives.   Please see the history of present illness.    All other systems reviewed and are negative.  EKGs/Labs/Other Studies Reviewed:    The following studies were reviewed today:  EKG:  EKG is not ordered today.      AAA Duplex 04/2023: Summary:  Abdominal Aorta: No evidence of an abdominal aortic aneurysm was  visualized. There is evidence of abnormal dilation of the Right Common  Iliac artery and  Left Common Iliac artery. Bilateral CIA aneurysms have  increased since previous CTA on 03/15/21.    *See table(s) above for measurements and observations.  Echo 09/2022:  1. Left ventricular ejection fraction, by estimation, is 60 to 65%. The  left ventricle has normal function. The left ventricle has no regional  wall motion abnormalities. Left ventricular diastolic parameters are  consistent with Grade I diastolic  dysfunction (impaired relaxation). The average left ventricular global  longitudinal strain is -23.0 %. The global longitudinal strain is normal.   2. Right ventricular systolic function is normal. The right ventricular  size is normal. Tricuspid regurgitation signal is inadequate for assessing  PA pressure.   3. The mitral valve is normal in structure. No evidence of mitral valve  regurgitation. No evidence of mitral stenosis.   4. The aortic valve has an indeterminant number of cusps. There is mild calcification of the aortic valve. There is mild thickening of the aortic valve. Aortic valve regurgitation is not visualized. No aortic stenosis is present.   5. The inferior vena cava is normal in size with greater than 50%  respiratory variability, suggesting right atrial pressure of 3 mmHg.   Comparison(s): Echocardiogram done 04/14/21 showed an EF of 65-70%.  ABIs on 04/14/2021: Right: Resting right ankle-brachial index indicates noncompressible right  lower extremity arteries. The right toe-brachial index is normal.   Left: Resting left ankle-brachial index indicates noncompressible left  lower extremity arteries. The left toe-brachial index is normal.  2D Complete Echo on 04/14/2021:  1. Left ventricular ejection fraction, by estimation, is  65 to 70%. The  left ventricle has normal function. The left ventricle has no regional  wall motion abnormalities. Left ventricular diastolic parameters were  normal. The average left ventricular  global longitudinal strain is -20.1  %. The global longitudinal strain is  normal.   2. Right ventricular systolic function is normal. The right ventricular  size is normal.   3. The mitral valve is normal in structure. Trivial mitral valve  regurgitation. No evidence of mitral stenosis.   4. The aortic valve has an indeterminant number of cusps. There is  moderate calcification of the aortic valve. There is moderate thickening  of the aortic valve. Aortic valve regurgitation is not visualized. No  aortic stenosis is present.   5. The inferior vena cava is normal in size with greater than 50%  respiratory variability, suggesting right atrial pressure of 3 mmHg.   Comparison(s): Stress echocr=ardiogram done 10/17/12 showed an EF of 70%.   Lexiscan  on 03/08/2021: No diagnostic ST segment changes to indicate ischemia. Small, moderate intensity, apical to basal inferior defect that is fixed and suggestive of scar although normal wall motion suggests possibility of diaphragmatic attenuation as well. No definitive ischemic territories are noted. This is a low risk study. Nuclear stress EF: 71%.   Physical Exam:    VS:  BP 122/84 (BP Location: Right Arm, Cuff Size: Normal)   Pulse 62   Ht 5' 6 (1.676 m)   Wt 187 lb (84.8 kg)   SpO2 95%   BMI 30.18 kg/m     Wt Readings from Last 3 Encounters:  04/21/24 187 lb (84.8 kg)  01/02/24 187 lb (84.8 kg)  10/26/23 184 lb 3.2 oz (83.6 kg)     GEN: Well nourished, well developed in no acute distress HEENT: Normal NECK: No JVD; No carotid bruits CARDIAC: S1/S2, RRR, no murmurs, rubs, gallops; 2+ peripheral pulses throughout, strong and equal bilaterally RESPIRATORY:  Clear and diminished to auscultation without rales, wheezing or rhonchi  MUSCULOSKELETAL:  No edema; No deformity  SKIN: Thin skin, warm and dry NEUROLOGIC:  Alert and oriented x 3 PSYCHIATRIC:  Normal affect   ASSESSMENT & PLAN:    In order of problems listed above:  HTN Blood pressure stable.  BP is  well-controlled at home.  Continue current medication regimen.  Heart healthy diet and regular cardiovascular exercise as tolerated encouraged. Will obtain CBC and CMET.   HLD Labs from PCP show most recent LDL of 122. Continue simvastatin . Will obtain FLP and CMET. Heart healthy diet and regular cardiovascular exercise encouraged.   Bilateral iliac artery aneurysms AAA duplex showed bilateral CIA aneurysms have increased since previous CTA in 2022. CT of abdomen in May 2022 revealed 2.5 cm right and 2.3 cm left common iliac artery fusiform aneurysm. Denies any symptoms, continue to f/u with [facility].   Tobacco abuse Currently smoking 1/2 pack/day. Smoking cessation encouraged and discussed.     COPD No recent worsening shortness of breath or exacerbations noted. Continue to follow up with Pulmonology. Continue current medication regimen. Continue to follow with PCP.  10. Disposition:  Follow-up with me or Dr. Dorn Ross in 6 months or sooner if anything changes.    Medication Adjustments/Labs and Tests Ordered: Current medicines are reviewed at length with the patient today.  Concerns regarding medicines are outlined above.  Orders Placed This Encounter  Procedures   CBC   Comprehensive Metabolic Panel (CMET)   Lipid Profile   No orders of the defined types were placed  in this encounter.   Patient Instructions  Medication Instructions:  Your physician recommends that you continue on your current medications as directed. Please refer to the Current Medication list given to you today.  Labwork: In 1-2 weeks at [facility]   Testing/Procedures: None   Follow-Up: Your physician recommends that you schedule a follow-up appointment in: 6 Months    Any Other Special Instructions Will Be Listed Below (If Applicable).  If you need a refill on your cardiac medications before your next appointment, please call your pharmacy.   Signed, Almarie Crate, NP

## 2024-04-21 NOTE — Patient Instructions (Addendum)
 Medication Instructions:  Your physician recommends that you continue on your current medications as directed. Please refer to the Current Medication list given to you today.  Labwork: In 1-2 weeks at [facility]   Testing/Procedures: None   Follow-Up: Your physician recommends that you schedule a follow-up appointment in: 6 Months    Any Other Special Instructions Will Be Listed Below (If Applicable).  If you need a refill on your cardiac medications before your next appointment, please call your pharmacy.

## 2024-04-24 ENCOUNTER — Ambulatory Visit: Payer: Medicare HMO | Admitting: Nurse Practitioner

## 2024-04-25 ENCOUNTER — Ambulatory Visit: Payer: Medicare HMO | Admitting: Nurse Practitioner

## 2024-05-28 DIAGNOSIS — I10 Essential (primary) hypertension: Secondary | ICD-10-CM | POA: Diagnosis not present

## 2024-05-28 DIAGNOSIS — G8929 Other chronic pain: Secondary | ICD-10-CM | POA: Diagnosis not present

## 2024-05-28 DIAGNOSIS — F419 Anxiety disorder, unspecified: Secondary | ICD-10-CM | POA: Diagnosis not present

## 2024-05-28 DIAGNOSIS — I723 Aneurysm of iliac artery: Secondary | ICD-10-CM | POA: Diagnosis not present

## 2024-05-28 DIAGNOSIS — I708 Atherosclerosis of other arteries: Secondary | ICD-10-CM | POA: Diagnosis not present

## 2024-05-28 DIAGNOSIS — I2510 Atherosclerotic heart disease of native coronary artery without angina pectoris: Secondary | ICD-10-CM | POA: Diagnosis not present

## 2024-05-28 DIAGNOSIS — F3341 Major depressive disorder, recurrent, in partial remission: Secondary | ICD-10-CM | POA: Diagnosis not present

## 2024-05-28 DIAGNOSIS — J449 Chronic obstructive pulmonary disease, unspecified: Secondary | ICD-10-CM | POA: Diagnosis not present

## 2024-05-28 DIAGNOSIS — E782 Mixed hyperlipidemia: Secondary | ICD-10-CM | POA: Diagnosis not present

## 2024-05-28 DIAGNOSIS — Z720 Tobacco use: Secondary | ICD-10-CM | POA: Diagnosis not present

## 2024-05-28 DIAGNOSIS — R7303 Prediabetes: Secondary | ICD-10-CM | POA: Diagnosis not present

## 2024-05-28 DIAGNOSIS — I700 Atherosclerosis of aorta: Secondary | ICD-10-CM | POA: Diagnosis not present

## 2024-05-29 DIAGNOSIS — E782 Mixed hyperlipidemia: Secondary | ICD-10-CM | POA: Diagnosis not present

## 2024-05-29 DIAGNOSIS — I10 Essential (primary) hypertension: Secondary | ICD-10-CM | POA: Diagnosis not present

## 2024-05-29 DIAGNOSIS — I2510 Atherosclerotic heart disease of native coronary artery without angina pectoris: Secondary | ICD-10-CM | POA: Diagnosis not present

## 2024-05-29 DIAGNOSIS — F3341 Major depressive disorder, recurrent, in partial remission: Secondary | ICD-10-CM | POA: Diagnosis not present

## 2024-06-29 DIAGNOSIS — F3341 Major depressive disorder, recurrent, in partial remission: Secondary | ICD-10-CM | POA: Diagnosis not present

## 2024-06-29 DIAGNOSIS — I2510 Atherosclerotic heart disease of native coronary artery without angina pectoris: Secondary | ICD-10-CM | POA: Diagnosis not present

## 2024-06-29 DIAGNOSIS — I10 Essential (primary) hypertension: Secondary | ICD-10-CM | POA: Diagnosis not present

## 2024-06-29 DIAGNOSIS — E782 Mixed hyperlipidemia: Secondary | ICD-10-CM | POA: Diagnosis not present

## 2024-07-17 ENCOUNTER — Other Ambulatory Visit: Payer: Self-pay | Admitting: Gastroenterology

## 2024-07-17 DIAGNOSIS — K5909 Other constipation: Secondary | ICD-10-CM

## 2024-07-17 DIAGNOSIS — R1310 Dysphagia, unspecified: Secondary | ICD-10-CM

## 2024-07-17 DIAGNOSIS — K219 Gastro-esophageal reflux disease without esophagitis: Secondary | ICD-10-CM

## 2024-07-29 DIAGNOSIS — E782 Mixed hyperlipidemia: Secondary | ICD-10-CM | POA: Diagnosis not present

## 2024-07-29 DIAGNOSIS — I2510 Atherosclerotic heart disease of native coronary artery without angina pectoris: Secondary | ICD-10-CM | POA: Diagnosis not present

## 2024-07-29 DIAGNOSIS — I10 Essential (primary) hypertension: Secondary | ICD-10-CM | POA: Diagnosis not present

## 2024-07-29 DIAGNOSIS — F3341 Major depressive disorder, recurrent, in partial remission: Secondary | ICD-10-CM | POA: Diagnosis not present

## 2024-08-16 ENCOUNTER — Other Ambulatory Visit: Payer: Self-pay | Admitting: Internal Medicine

## 2024-08-16 DIAGNOSIS — K5909 Other constipation: Secondary | ICD-10-CM

## 2024-08-16 DIAGNOSIS — R1310 Dysphagia, unspecified: Secondary | ICD-10-CM

## 2024-08-16 DIAGNOSIS — K219 Gastro-esophageal reflux disease without esophagitis: Secondary | ICD-10-CM

## 2024-08-29 DIAGNOSIS — I2510 Atherosclerotic heart disease of native coronary artery without angina pectoris: Secondary | ICD-10-CM | POA: Diagnosis not present

## 2024-08-29 DIAGNOSIS — F3341 Major depressive disorder, recurrent, in partial remission: Secondary | ICD-10-CM | POA: Diagnosis not present

## 2024-08-29 DIAGNOSIS — I10 Essential (primary) hypertension: Secondary | ICD-10-CM | POA: Diagnosis not present

## 2024-08-29 DIAGNOSIS — E782 Mixed hyperlipidemia: Secondary | ICD-10-CM | POA: Diagnosis not present

## 2024-09-24 ENCOUNTER — Telehealth: Payer: Self-pay | Admitting: Gastroenterology

## 2024-09-24 NOTE — Telephone Encounter (Signed)
 Reviewed some paperwork from [facility] for [facility] in regards to this patient.  Appears primary care provider has continued to refill omeprazole  40 mg tablets.  Sent prescription again on 07/22/2024 and it was filled.  He has been getting esomeprazole  40 mg twice daily from us  and that was the plan at his last office visit therefore please notify patient to stop taking omeprazole  and only take esomeprazole  (Nexium ) twice daily.  Please fax back this paperwork to [facility].   Charmaine Melia, MSN, APRN, FNP-BC, AGACNP-BC [facility] at [facility]

## 2024-09-28 DIAGNOSIS — F3341 Major depressive disorder, recurrent, in partial remission: Secondary | ICD-10-CM | POA: Diagnosis not present

## 2024-09-28 DIAGNOSIS — I10 Essential (primary) hypertension: Secondary | ICD-10-CM | POA: Diagnosis not present

## 2024-09-28 DIAGNOSIS — E782 Mixed hyperlipidemia: Secondary | ICD-10-CM | POA: Diagnosis not present

## 2024-09-28 DIAGNOSIS — I2510 Atherosclerotic heart disease of native coronary artery without angina pectoris: Secondary | ICD-10-CM | POA: Diagnosis not present

## 2024-09-29 NOTE — Telephone Encounter (Signed)
 The information was faxed to [facility]

## 2024-10-21 ENCOUNTER — Encounter: Payer: Self-pay | Admitting: Nurse Practitioner

## 2024-10-21 ENCOUNTER — Ambulatory Visit: Attending: Nurse Practitioner | Admitting: Nurse Practitioner

## 2024-10-21 ENCOUNTER — Other Ambulatory Visit (HOSPITAL_COMMUNITY)
Admission: RE | Admit: 2024-10-21 | Discharge: 2024-10-21 | Disposition: A | Discharge status: Home / Self Care | Source: Ambulatory Visit | Attending: Nurse Practitioner | Admitting: Nurse Practitioner

## 2024-10-21 VITALS — BP 116/70 | HR 60 | Ht 66.0 in | Wt 184.6 lb

## 2024-10-21 DIAGNOSIS — Z720 Tobacco use: Secondary | ICD-10-CM

## 2024-10-21 DIAGNOSIS — J449 Chronic obstructive pulmonary disease, unspecified: Secondary | ICD-10-CM | POA: Diagnosis not present

## 2024-10-21 DIAGNOSIS — E785 Hyperlipidemia, unspecified: Secondary | ICD-10-CM | POA: Diagnosis not present

## 2024-10-21 DIAGNOSIS — I10 Essential (primary) hypertension: Secondary | ICD-10-CM

## 2024-10-21 DIAGNOSIS — Z87898 Personal history of other specified conditions: Secondary | ICD-10-CM

## 2024-10-21 DIAGNOSIS — R0789 Other chest pain: Secondary | ICD-10-CM | POA: Diagnosis not present

## 2024-10-21 DIAGNOSIS — I723 Aneurysm of iliac artery: Secondary | ICD-10-CM

## 2024-10-21 LAB — COMPREHENSIVE METABOLIC PANEL WITH GFR
ALT: 9 U/L (ref 0–44)
AST: 19 U/L (ref 15–41)
Albumin: 4.2 g/dL (ref 3.5–5.0)
Alkaline Phosphatase: 68 U/L (ref 38–126)
Anion gap: 13 (ref 5–15)
BUN: 13 mg/dL (ref 8–23)
CO2: 20 mmol/L — ABNORMAL LOW (ref 22–32)
Calcium: 8.9 mg/dL (ref 8.9–10.3)
Chloride: 109 mmol/L (ref 98–111)
Creatinine, Ser: 0.99 mg/dL (ref 0.61–1.24)
GFR, Estimated: >60 mL/min
Glucose, Bld: 111 mg/dL — ABNORMAL HIGH (ref 70–99)
Potassium: 4.1 mmol/L (ref 3.5–5.1)
Sodium: 141 mmol/L (ref 135–145)
Total Bilirubin: 0.3 mg/dL (ref 0.0–1.2)
Total Protein: 6.7 g/dL (ref 6.5–8.1)

## 2024-10-21 LAB — CBC
HCT: 39.9 % (ref 39.0–52.0)
Hemoglobin: 13.2 g/dL (ref 13.0–17.0)
MCH: 30.8 pg (ref 26.0–34.0)
MCHC: 33.1 g/dL (ref 30.0–36.0)
MCV: 93.2 fL (ref 80.0–100.0)
Platelets: 199 K/uL (ref 150–400)
RBC: 4.28 MIL/uL (ref 4.22–5.81)
RDW: 14 % (ref 11.5–15.5)
WBC: 6.4 K/uL (ref 4.0–10.5)
nRBC: 0 % (ref 0.0–0.2)

## 2024-10-21 LAB — LIPID PANEL
Cholesterol: 133 mg/dL (ref 0–200)
HDL: 39 mg/dL — ABNORMAL LOW
LDL Cholesterol: 70 mg/dL (ref 0–99)
Total CHOL/HDL Ratio: 3.4 ratio
Triglycerides: 120 mg/dL
VLDL: 24 mg/dL (ref 0–40)

## 2024-10-21 MED ORDER — NITROGLYCERIN 0.4 MG SL SUBL: 0.4000 mg | SUBLINGUAL_TABLET | SUBLINGUAL | 2 refills | Status: AC | PRN

## 2024-10-21 NOTE — Patient Instructions (Addendum)
 Medication Instructions:  Your physician recommends that you continue on your current medications as directed. Please refer to the Current Medication list given to you today.   Labwork: Labs to be completed at [facility], CBC and CMET  Testing/Procedures: None  Follow-Up: Your physician recommends that you schedule a follow-up appointment in: 6 months  Any Other Special Instructions Will Be Listed Below (If Applicable). Thank you for choosing [facility]!     If you need a refill on your cardiac medications before your next appointment, please call your pharmacy.

## 2024-10-21 NOTE — Progress Notes (Signed)
 " Cardiology Office Note:    Date:  10/21/2024 ID:  Trevor Berg, DOB 12/31/1950, MRN [id number] PCP:  Vernon Velna SAUNDERS, MD [facility] Providers Cardiologist:  Alvan Carrier, MD     Referring MD: Vernon Velna SAUNDERS, MD   CC: Here for 6 month follow-up  History of Present Illness:    Trevor Berg is a 73 y.o. male with a PMH of chronic, atypical chest pain, hypertension, hyperlipidemia, bilateral iliac artery aneurysms (follows Dr. Magda with [facility]), tobacco abuse, COPD, GERD, and IBS, who presents today for 71-month follow-up appointment.  Last seen by Dr. Carrier Alvan on Mar 22, 2023.  He denied any cardiac chest pain at that time, although it was noted he had a long history of atypical chest pain and chronic shortness of breath with multiple benign Lexiscans in the past.  Blood pressure was elevated in the office, however home numbers were at goal.  10/26/2023 - Today he presents for follow-up. He states he is doing well. Denies any acute cardiac complaints or issues. Denies any chest pain, shortness of breath, palpitations, syncope, presyncope, dizziness, orthopnea, PND, swelling or significant weight changes, acute bleeding, or claudication. Continues to smoke.    04/21/2024 - Here for follow-up. Denies any complaints. Denies any chest pain, shortness of breath, palpitations, syncope, presyncope, dizziness, orthopnea, PND, swelling or significant weight changes, acute bleeding, or claudication.  10/21/2024 - Here for follow-up and doing well. Admits to urinary frequency but denies any cardiac complaints or issues. Denies any chest pain, shortness of breath, palpitations, syncope, presyncope, dizziness, orthopnea, PND, swelling or significant weight changes, acute bleeding, or claudication.  SH: In his free time, he collects pocket watches and knives. Own over 200 knives.   Please see the history of present illness.    All other systems reviewed and are  negative.  EKGs/Labs/Other Studies Reviewed:    The following studies were reviewed today:  EKG:  EKG Interpretation Date/Time:  Tuesday October 21 2024 13:57:05 EST Ventricular Rate:  60 PR Interval:  182 QRS Duration:  94 QT Interval:  416 QTC Calculation: 416 R Axis:   -19  Text Interpretation: Normal sinus rhythm Normal ECG When compared with ECG of 26-Oct-2023 15:07, No significant change was found Confirmed by Miriam Norris [phone]) on 10/21/2024 1:57:41 PM   AAA Duplex 04/2023: Summary:  Abdominal Aorta: No evidence of an abdominal aortic aneurysm was  visualized. There is evidence of abnormal dilation of the Right Common  Iliac artery and Left Common Iliac artery. Bilateral CIA aneurysms have  increased since previous CTA on 03/15/21.    *See table(s) above for measurements and observations.  Echo 09/2022:  1. Left ventricular ejection fraction, by estimation, is 60 to 65%. The  left ventricle has normal function. The left ventricle has no regional  wall motion abnormalities. Left ventricular diastolic parameters are  consistent with Grade I diastolic  dysfunction (impaired relaxation). The average left ventricular global  longitudinal strain is -23.0 %. The global longitudinal strain is normal.   2. Right ventricular systolic function is normal. The right ventricular  size is normal. Tricuspid regurgitation signal is inadequate for assessing  PA pressure.   3. The mitral valve is normal in structure. No evidence of mitral valve  regurgitation. No evidence of mitral stenosis.   4. The aortic valve has an indeterminant number of cusps. There is mild calcification of the aortic valve. There is mild thickening of the aortic valve. Aortic valve regurgitation is not visualized.  No aortic stenosis is present.   5. The inferior vena cava is normal in size with greater than 50%  respiratory variability, suggesting right atrial pressure of 3 mmHg.   Comparison(s):  Echocardiogram done 04/14/21 showed an EF of 65-70%.  ABIs on 04/14/2021: Right: Resting right ankle-brachial index indicates noncompressible right  lower extremity arteries. The right toe-brachial index is normal.   Left: Resting left ankle-brachial index indicates noncompressible left  lower extremity arteries. The left toe-brachial index is normal.  2D Complete Echo on 04/14/2021:  1. Left ventricular ejection fraction, by estimation, is 65 to 70%. The  left ventricle has normal function. The left ventricle has no regional  wall motion abnormalities. Left ventricular diastolic parameters were  normal. The average left ventricular  global longitudinal strain is -20.1 %. The global longitudinal strain is  normal.   2. Right ventricular systolic function is normal. The right ventricular  size is normal.   3. The mitral valve is normal in structure. Trivial mitral valve  regurgitation. No evidence of mitral stenosis.   4. The aortic valve has an indeterminant number of cusps. There is  moderate calcification of the aortic valve. There is moderate thickening  of the aortic valve. Aortic valve regurgitation is not visualized. No  aortic stenosis is present.   5. The inferior vena cava is normal in size with greater than 50%  respiratory variability, suggesting right atrial pressure of 3 mmHg.   Comparison(s): Stress echocr=ardiogram done 10/17/12 showed an EF of 70%.   Lexiscan  on 03/08/2021: No diagnostic ST segment changes to indicate ischemia. Small, moderate intensity, apical to basal inferior defect that is fixed and suggestive of scar although normal wall motion suggests possibility of diaphragmatic attenuation as well. No definitive ischemic territories are noted. This is a low risk study. Nuclear stress EF: 71%.   Physical Exam:    VS:  BP 116/70 (BP Location: Left Arm)   Pulse 60   Ht 5' 6 (1.676 m)   Wt 184 lb 9.6 oz (83.7 kg)   SpO2 96%   BMI 29.80 kg/m     Wt  Readings from Last 3 Encounters:  10/21/24 184 lb 9.6 oz (83.7 kg)  04/21/24 187 lb (84.8 kg)  01/02/24 187 lb (84.8 kg)     GEN: Well nourished, well developed in no acute distress HEENT: Normal NECK: No JVD; No carotid bruits CARDIAC: S1/S2, RRR, no murmurs, rubs, gallops; 2+ peripheral pulses throughout, strong and equal bilaterally RESPIRATORY:  Clear and diminished to auscultation without rales, wheezing or rhonchi  MUSCULOSKELETAL:  No edema; No deformity  SKIN: Thin skin, warm and dry NEUROLOGIC:  Alert and oriented x 3 PSYCHIATRIC:  Normal affect   ASSESSMENT & PLAN:    In order of problems listed above:  Hx of chest pain Stable with no anginal symptoms. No indication for ischemic evaluation. Most recent stress test benign. Will provide refill of NTG. Heart healthy diet and regular cardiovascular exercise encouraged. Smoking cessation encouraged and discussed. Care and ED precautions discussed.   HTN Blood pressure stable.  BP is well-controlled at home.  Continue current medication regimen.  Heart healthy diet and regular cardiovascular exercise as tolerated encouraged. Will obtain CBC and CMET - previously ordered at last visit.   HLD Labs from PCP show most recent LDL of 122. Continue simvastatin . Will obtain FLP and CMET. Heart healthy diet and regular cardiovascular exercise encouraged.   Bilateral iliac artery aneurysms AAA duplex showed bilateral CIA aneurysms have increased since  previous CTA in 2022. CT of abdomen in May 2022 revealed 2.5 cm right and 2.3 cm left common iliac artery fusiform aneurysm. Denies any symptoms, continue to f/u with [facility].   Tobacco abuse Currently smoking 1/2 pack/day. Smoking cessation encouraged and discussed.     COPD No recent worsening shortness of breath or exacerbations noted. Continue to follow up with Pulmonology. Continue current medication regimen. Continue to follow with PCP.  10. Disposition:  Will provide refill per his  request. Follow-up with me or Dr. Dorn Ross in 6 months or sooner if anything changes.    Medication Adjustments/Labs and Tests Ordered: Current medicines are reviewed at length with the patient today.  Concerns regarding medicines are outlined above.  Orders Placed This Encounter  Procedures   EKG 12-Lead   Meds ordered this encounter  Medications   nitroGLYCERIN  (NITROSTAT ) 0.4 MG SL tablet    Sig: Place 1 tablet (0.4 mg total) under the tongue every 5 (five) minutes as needed for chest pain (If no relief after 3rd dose proceed to ED or call 911). Chest Pain    Dispense:  25 tablet    Refill:  2    Patient Instructions  Medication Instructions:  Your physician recommends that you continue on your current medications as directed. Please refer to the Current Medication list given to you today.   Labwork: Labs to be completed at [facility], CBC and CMET  Testing/Procedures: None  Follow-Up: Your physician recommends that you schedule a follow-up appointment in: 6 months  Any Other Special Instructions Will Be Listed Below (If Applicable). Thank you for choosing [facility]!     If you need a refill on your cardiac medications before your next appointment, please call your pharmacy.    Signed, Almarie Crate, NP "

## 2024-11-03 ENCOUNTER — Encounter: Payer: Self-pay | Admitting: Internal Medicine

## 2024-11-03 ENCOUNTER — Ambulatory Visit: Admitting: Internal Medicine

## 2024-11-03 VITALS — BP 120/80 | HR 69 | Temp 97.6°F | Resp 18 | Wt 185.0 lb

## 2024-11-03 DIAGNOSIS — J302 Other seasonal allergic rhinitis: Secondary | ICD-10-CM

## 2024-11-03 DIAGNOSIS — J3089 Other allergic rhinitis: Secondary | ICD-10-CM

## 2024-11-03 MED ORDER — FLUTICASONE PROPIONATE 50 MCG/ACT NA SUSP: 2.0000 | Freq: Every day | NASAL | 5 refills | Status: AC

## 2024-11-03 MED ORDER — AZELASTINE HCL 0.1 % NA SOLN: 2.0000 | Freq: Two times a day (BID) | NASAL | 5 refills | Status: AC

## 2024-11-03 MED ORDER — LEVOCETIRIZINE DIHYDROCHLORIDE 5 MG PO TABS: 5.0000 mg | ORAL_TABLET | Freq: Every evening | ORAL | 5 refills | Status: AC

## 2024-11-03 NOTE — Patient Instructions (Addendum)
 Seasonal and perennial allergic rhinitis - SPT 05/2022: positive to indoor molds, outdoor molds, dust mites, cat, and cockroach. - Use nasal saline rinses before nose sprays such as with Neilmed Sinus Rinse.  Use distilled water .   - Use Flonase  2 sprays each nostril daily. Aim upward and outward. - Use Azelastine  2 sprays each nostril twice daily. Aim upward and outward. - Use Xyzal  5mg  daily. Stop Claritin.  COPD - Continue follow up with Pulmonology.

## 2024-11-03 NOTE — Progress Notes (Signed)
" ° °  FOLLOW UP Date of Service/Encounter:  11/03/2024   Subjective:  Trevor Berg (DOB: 1950-12-29) is a 74 y.o. male who [facility] for follow up for allergic rhinitis.   History obtained from: chart review and patient. Last seen on 07/17/2022 with me, discussed use of Flonase  and Allegra , start Azelastine .  He reports recently having worsening allergies with congestion, sneezing, ear fullness.  He has been using Claritin for over 2 years and Azelastine  few times a day with minimal improvement.  They stopped his Flonase  and switched to Azelastine . Also notes seeing another Allergist but does not wish to go back there.  Past Medical History: Past Medical History:  Diagnosis Date   Anginal pain    Anxiety    Arthritis    both hands   Asthma    Chest pain 2005   noncritical coronary disease in 2005: 40% distal RCA, 30% CX, 50% OM 2, EF-60%,   COPD (chronic obstructive pulmonary disease) (HCC)    Chronic bronchitis; 100-pack-year cigarette consumption   Gastroesophageal reflux disease    possible small Schatzki's ring; esophageal dilatation in 2005   H. 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 Physical Exam: GEN: alert, well developed HEENT: clear conjunctiva, nose with mild inferior turbinate hypertrophy, pink nasal mucosa, + clear rhinorrhea, + cobblestoning HEART: regular rate and rhythm, no murmur LUNGS: clear to auscultation bilaterally, no coughing, unlabored respiration SKIN: no rashes or lesions  Assessment:   1. Seasonal and perennial allergic rhinitis     Plan/Recommendations:    Seasonal and perennial allergic rhinitis - Uncontrolled, discussed use of both Flonase /Azelastine  and will change his anti histamine.  - SPT 05/2022: positive to indoor molds, outdoor molds, dust mites, cat, and cockroach. - Use nasal saline rinses before nose sprays such as with Neilmed Sinus Rinse.  Use distilled water .   - Use Flonase  2 sprays each nostril daily. Aim upward and outward. - Use Azelastine  2 sprays each nostril twice daily. Aim upward and outward. - Use Xyzal  5mg  daily. Stop Claritin.  COPD - Continue follow up with Pulmonology.  - On Anoro Ellipta  1 puff daily.         Return in about 6 months (around 05/03/2025).  Arleta Blanch, MD [facility]       "

## 2024-11-06 ENCOUNTER — Ambulatory Visit: Payer: Self-pay | Admitting: Nurse Practitioner

## 2024-11-07 ENCOUNTER — Other Ambulatory Visit: Payer: Self-pay | Admitting: Gastroenterology

## 2024-11-07 ENCOUNTER — Encounter: Payer: Self-pay | Admitting: *Deleted

## 2024-11-07 DIAGNOSIS — K5909 Other constipation: Secondary | ICD-10-CM

## 2024-11-13 ENCOUNTER — Other Ambulatory Visit: Payer: Self-pay

## 2024-11-13 DIAGNOSIS — Z87891 Personal history of nicotine dependence: Secondary | ICD-10-CM

## 2024-11-13 DIAGNOSIS — F17210 Nicotine dependence, cigarettes, uncomplicated: Secondary | ICD-10-CM

## 2024-11-13 DIAGNOSIS — Z122 Encounter for screening for malignant neoplasm of respiratory organs: Secondary | ICD-10-CM

## 2024-12-08 ENCOUNTER — Ambulatory Visit: Admitting: Orthopedic Surgery
